# Patient Record
Sex: Female | Born: 1945 | Race: White | Hispanic: No | Marital: Married | Smoking: Former smoker
Health system: Southern US, Community
[De-identification: ages and names within clinical notes are randomized; demographics above are authoritative.]

## PROBLEM LIST (undated history)

## (undated) ENCOUNTER — Emergency Department (HOSPITAL_COMMUNITY): Admission: EM | Payer: Medicare Other | Source: Home / Self Care

## (undated) DIAGNOSIS — F419 Anxiety disorder, unspecified: Secondary | ICD-10-CM

## (undated) DIAGNOSIS — I10 Essential (primary) hypertension: Secondary | ICD-10-CM

## (undated) DIAGNOSIS — I4891 Unspecified atrial fibrillation: Secondary | ICD-10-CM

## (undated) DIAGNOSIS — I480 Paroxysmal atrial fibrillation: Principal | ICD-10-CM

## (undated) DIAGNOSIS — J449 Chronic obstructive pulmonary disease, unspecified: Secondary | ICD-10-CM

## (undated) DIAGNOSIS — E785 Hyperlipidemia, unspecified: Secondary | ICD-10-CM

## (undated) DIAGNOSIS — E559 Vitamin D deficiency, unspecified: Secondary | ICD-10-CM

## (undated) DIAGNOSIS — B029 Zoster without complications: Secondary | ICD-10-CM

## (undated) DIAGNOSIS — R0600 Dyspnea, unspecified: Secondary | ICD-10-CM

## (undated) DIAGNOSIS — Z950 Presence of cardiac pacemaker: Secondary | ICD-10-CM

## (undated) HISTORY — DX: Hyperlipidemia, unspecified: E78.5

## (undated) HISTORY — PX: ABDOMINAL HYSTERECTOMY: SHX81

## (undated) HISTORY — DX: Essential (primary) hypertension: I10

## (undated) HISTORY — DX: Paroxysmal atrial fibrillation: I48.0

## (undated) HISTORY — DX: Anxiety disorder, unspecified: F41.9

## (undated) HISTORY — DX: Vitamin D deficiency, unspecified: E55.9

## (undated) HISTORY — PX: CATARACT EXTRACTION: SUR2

---

## 2015-09-02 ENCOUNTER — Encounter (HOSPITAL_COMMUNITY): Payer: Self-pay | Admitting: *Deleted

## 2015-09-02 ENCOUNTER — Emergency Department (HOSPITAL_COMMUNITY)
Admission: EM | Admit: 2015-09-02 | Discharge: 2015-09-02 | Disposition: A | Discharge status: Home / Self Care | Payer: BLUE CROSS/BLUE SHIELD | Attending: Emergency Medicine | Admitting: Emergency Medicine

## 2015-09-02 ENCOUNTER — Emergency Department (HOSPITAL_COMMUNITY): Payer: BLUE CROSS/BLUE SHIELD

## 2015-09-02 DIAGNOSIS — Z8619 Personal history of other infectious and parasitic diseases: Secondary | ICD-10-CM | POA: Insufficient documentation

## 2015-09-02 DIAGNOSIS — I4891 Unspecified atrial fibrillation: Secondary | ICD-10-CM | POA: Insufficient documentation

## 2015-09-02 DIAGNOSIS — Z7901 Long term (current) use of anticoagulants: Secondary | ICD-10-CM | POA: Diagnosis not present

## 2015-09-02 DIAGNOSIS — Z7951 Long term (current) use of inhaled steroids: Secondary | ICD-10-CM | POA: Diagnosis not present

## 2015-09-02 DIAGNOSIS — J449 Chronic obstructive pulmonary disease, unspecified: Secondary | ICD-10-CM

## 2015-09-02 DIAGNOSIS — Z87891 Personal history of nicotine dependence: Secondary | ICD-10-CM | POA: Insufficient documentation

## 2015-09-02 DIAGNOSIS — Z79899 Other long term (current) drug therapy: Secondary | ICD-10-CM | POA: Insufficient documentation

## 2015-09-02 DIAGNOSIS — J441 Chronic obstructive pulmonary disease with (acute) exacerbation: Secondary | ICD-10-CM | POA: Diagnosis not present

## 2015-09-02 DIAGNOSIS — R0602 Shortness of breath: Secondary | ICD-10-CM | POA: Diagnosis present

## 2015-09-02 HISTORY — DX: Chronic obstructive pulmonary disease, unspecified: J44.9

## 2015-09-02 HISTORY — DX: Zoster without complications: B02.9

## 2015-09-02 LAB — CBC WITH DIFFERENTIAL/PLATELET
BASOS ABS: 0 10*3/uL (ref 0.0–0.1)
Basophils Relative: 0 %
EOS ABS: 0.1 10*3/uL (ref 0.0–0.7)
EOS PCT: 1 %
HCT: 37.7 % (ref 36.0–46.0)
Hemoglobin: 12 g/dL (ref 12.0–15.0)
LYMPHS PCT: 19 %
Lymphs Abs: 1.1 10*3/uL (ref 0.7–4.0)
MCH: 31.4 pg (ref 26.0–34.0)
MCHC: 31.8 g/dL (ref 30.0–36.0)
MCV: 98.7 fL (ref 78.0–100.0)
MONO ABS: 0.5 10*3/uL (ref 0.1–1.0)
Monocytes Relative: 9 %
Neutro Abs: 4.2 10*3/uL (ref 1.7–7.7)
Neutrophils Relative %: 71 %
PLATELETS: 196 10*3/uL (ref 150–400)
RBC: 3.82 MIL/uL — AB (ref 3.87–5.11)
RDW: 14.8 % (ref 11.5–15.5)
WBC: 5.9 10*3/uL (ref 4.0–10.5)

## 2015-09-02 LAB — I-STAT TROPONIN, ED: TROPONIN I, POC: 0 ng/mL (ref 0.00–0.08)

## 2015-09-02 LAB — BASIC METABOLIC PANEL
Anion gap: 8 (ref 5–15)
BUN: 13 mg/dL (ref 6–20)
CALCIUM: 9.3 mg/dL (ref 8.9–10.3)
CO2: 36 mmol/L — ABNORMAL HIGH (ref 22–32)
CREATININE: 1.23 mg/dL — AB (ref 0.44–1.00)
Chloride: 98 mmol/L — ABNORMAL LOW (ref 101–111)
GFR calc Af Amer: 51 mL/min — ABNORMAL LOW (ref >60)
GFR, EST NON AFRICAN AMERICAN: 44 mL/min — AB (ref >60)
GLUCOSE: 96 mg/dL (ref 65–99)
POTASSIUM: 3.6 mmol/L (ref 3.5–5.1)
SODIUM: 142 mmol/L (ref 135–145)

## 2015-09-02 LAB — BRAIN NATRIURETIC PEPTIDE: B NATRIURETIC PEPTIDE 5: 403 pg/mL — AB (ref 0.0–100.0)

## 2015-09-02 MED ORDER — METHYLPREDNISOLONE SODIUM SUCC 125 MG IJ SOLR
125.0000 mg | Freq: Once | INTRAMUSCULAR | Status: AC
  Administered 2015-09-02: 125 mg via INTRAVENOUS
  Filled 2015-09-02: qty 2

## 2015-09-02 MED ORDER — ALBUTEROL SULFATE (2.5 MG/3ML) 0.083% IN NEBU
10.0000 mg | INHALATION_SOLUTION | Freq: Once | RESPIRATORY_TRACT | Status: AC
  Administered 2015-09-02: 10 mg via RESPIRATORY_TRACT
  Filled 2015-09-02: qty 12

## 2015-09-02 MED ORDER — IPRATROPIUM BROMIDE 0.02 % IN SOLN
0.5000 mg | Freq: Once | RESPIRATORY_TRACT | Status: AC
  Administered 2015-09-02: 0.5 mg via RESPIRATORY_TRACT
  Filled 2015-09-02: qty 2.5

## 2015-09-02 MED ORDER — PREDNISONE 10 MG (21) PO TBPK: 10.0000 mg | ORAL_TABLET | Freq: Every day | ORAL | Status: DC

## 2015-09-02 NOTE — Progress Notes (Signed)
Post CAT increased BBS with some faint expiratory wheezing noted. RA SpO2 90%.

## 2015-09-02 NOTE — ED Provider Notes (Signed)
CSN: [id number]     Arrival date & time 09/02/15  1107 History  By signing my name below, I, Jarvis Morgan, attest that this documentation has been prepared under the direction and in the presence of [name], MD. Electronically Signed: Jarvis Morgan, ED Scribe. 09/02/2015. 3:19 PM.    Chief Complaint  Patient presents with  . Shortness of Breath    The history is provided by the patient. No language interpreter was used.    HPI Comments: Tracey Dudley is a 69 y.o. female with a h/o COPD and atrial fibrilation who presents to the Emergency Department complaining of intermittent, moderate, SOB onset 2 weeks that has gradually worsened over the past 3 days. She endorses associated nausea.  Pt's son reports she followed up with an asthma specialist 2 weeks ago for similar episodes and was told to go to [facility] for treatment; she notes while there she was given breathing treatments and prednisone. Pt is no longer on prednisone medication.She notes she also recently followed up with her pulmonologist for this, Dr. Susa Simmonds, who advised her to use her inhaler 2x daily and nebulizer machine as needed. Pt is oxygen dependent at home and she reports that when she does not wear her O2 that her O2 saturation drops to 91%. Pt states the SOB is exacerbated with exertion. She notes her O2 saturation levels are stable at rest. Pt was recently diagnosed with shingles to her right arm several weeks ago. Pt is a former smoker. She denies any other associated symptoms.   Past Medical History  Diagnosis Date  . A-fib (HCC)   . COPD (chronic obstructive pulmonary disease) (HCC)   . Herpes zoster    History reviewed. No pertinent past surgical history. No family history on file. Social History  Substance Use Topics  . Smoking status: Former Games developer  . Smokeless tobacco: None  . Alcohol Use: No   OB History    No data available     Review of Systems  All other systems reviewed and are  negative.   Allergies  Metoprolol and Erythromycin  Home Medications   Prior to Admission medications   Medication Sig Start Date End Date Taking? Authorizing Provider  acetaminophen (TYLENOL) 500 MG tablet Take 1,000 mg by mouth every 6 (six) hours as needed for moderate pain.   Yes Historical Provider, MD  Aclidinium Bromide (TUDORZA PRESSAIR) 400 MCG/ACT AEPB Inhale 1 puff into the lungs daily.   Yes Historical Provider, MD  albuterol (PROVENTIL HFA;VENTOLIN HFA) 108 (90 BASE) MCG/ACT inhaler Inhale 1 puff into the lungs every 6 (six) hours as needed for wheezing or shortness of breath.   Yes Historical Provider, MD  atorvastatin (LIPITOR) 20 MG tablet Take 20 mg by mouth daily.   Yes Historical Provider, MD  Azilsartan Medoxomil (EDARBI) 80 MG TABS Take 1 tablet by mouth daily.   Yes Historical Provider, MD  dofetilide (TIKOSYN) 250 MCG capsule Take 250 mcg by mouth 2 (two) times daily.   Yes Historical Provider, MD  fluticasone-salmeterol (ADVAIR HFA) 115-21 MCG/ACT inhaler Inhale 2 puffs into the lungs 2 (two) times daily.   Yes Historical Provider, MD  furosemide (LASIX) 20 MG tablet Take 20 mg by mouth daily.   Yes Historical Provider, MD  hydrALAZINE (APRESOLINE) 100 MG tablet Take 100 mg by mouth 2 (two) times daily.   Yes Historical Provider, MD  Nebivolol HCl (BYSTOLIC) 20 MG TABS Take 1 tablet by mouth daily.   Yes Historical Provider, MD  predniSONE (STERAPRED UNI-PAK 21 TAB) 10 MG (21) TBPK tablet Take 1 tablet (10 mg total) by mouth daily. Take 6 tabs by mouth daily  for 2 days, then 5 tabs for 2 days, then 4 tabs for 2 days, then 3 tabs for 2 days, 2 tabs for 2 days, then 1 tab by mouth daily for 2 days 09/02/15   [name], MD  rivaroxaban (XARELTO) 20 MG TABS tablet Take 20 mg by mouth daily with supper.   Yes Historical Provider, MD   Triage Vitals: BP 158/67 mmHg  Pulse 57  Temp(Src) 97.5 F (36.4 C) (Oral)  Resp 22  Ht  (1.676 m)  Wt 168 lb (76.204 kg)   BMI 27.13 kg/m2  SpO2 93%  Physical Exam Physical Exam  Nursing note and vitals reviewed. Constitutional: She is oriented to person, place, and time. She appears well-developed and well-nourished. No distress.  HENT:  Head: Normocephalic and atraumatic.  Eyes: Pupils are equal, round, and reactive to light.  Neck: Normal range of motion.  Cardiovascular: Normal rate and intact distal pulses.   Pulmonary/Chest: No respiratory distress.  patient had mild expiratory wheezes to auscultation in all lung fields. Abdominal: Normal appearance. She exhibits no distension.  Musculoskeletal: Normal range of motion.  Neurological: She is alert and oriented to person, place, and time. No cranial nerve deficit.  Skin: Skin is warm and dry. No rash noted.  Psychiatric: She has a normal mood and affect. Her behavior is normal.   ED Course  Procedures (including critical care time)  DIAGNOSTIC STUDIES: Oxygen Saturation is 93% on RA, adequate by my interpretation.    COORDINATION OF CARE: 11:27 AM- Will order 12 Lead EKG.  Pt advised of plan for treatment and pt agrees.  Labs Review Labs Reviewed  BASIC METABOLIC PANEL - Abnormal; Notable for the following:    Chloride 98 (*)    CO2 36 (*)    Creatinine, Ser 1.23 (*)    GFR calc non Af Amer 44 (*)    GFR calc Af Amer 51 (*)    All other components within normal limits  CBC WITH DIFFERENTIAL/PLATELET - Abnormal; Notable for the following:    RBC 3.82 (*)    All other components within normal limits  BRAIN NATRIURETIC PEPTIDE - Abnormal; Notable for the following:    B Natriuretic Peptide 403.0 (*)    All other components within normal limits  I-STAT TROPOININ, ED    Imaging Review Dg Chest 2 View  09/02/2015  CLINICAL DATA:  Shortness of breath for 2 weeks, history of COPD EXAM: CHEST - 2 VIEW COMPARISON:  None. FINDINGS: Cardiac shadow is within normal limits. The lungs are well aerated bilaterally. Hyperinflation is noted. No acute  bony abnormality is noted. IMPRESSION: COPD without acute abnormality. Electronically Signed   By: Alcide Clever M.D.   On: 09/02/2015 12:31   I have personally reviewed and evaluated these images and lab results as part of my medical decision-making.   EKG Interpretation   Date/Time:  Friday September 02 2015 11:22:11 EST Ventricular Rate:  58 PR Interval:  155 QRS Duration: 98 QT Interval:  491 QTC Calculation: 482 R Axis:   92 Text Interpretation:  Sinus rhythm Right axis deviation Abnrm T, consider  ischemia, anterolateral lds No previous tracing Confirmed by Chelcie Estorga  MD,  Jonte Shiller ([id number]) on 09/02/2015 11:43:44 AM     Patient felt improved and wants to go home.  She has oxygen at home in case  she needs it. MDM   Final diagnoses:  Chronic obstructive pulmonary disease, unspecified COPD type (HCC)    I personally performed the services described in this documentation, which was scribed in my presence. The recorded information has been reviewed and considered.     Nelva Nayobert Deborah Dondero, MD 09/02/15 1520

## 2015-09-02 NOTE — ED Notes (Signed)
MD at bedside. 

## 2015-09-02 NOTE — Discharge Instructions (Signed)
Asthma, Adult Asthma is a recurring condition in which the airways tighten and narrow. Asthma can make it difficult to breathe. It can cause coughing, wheezing, and shortness of breath. Asthma episodes, also called asthma attacks, range from minor to life-threatening. Asthma cannot be cured, but medicines and lifestyle changes can help control it. CAUSES Asthma is believed to be caused by inherited (genetic) and environmental factors, but its exact cause is unknown. Asthma may be triggered by allergens, lung infections, or irritants in the air. Asthma triggers are different for each person. Common triggers include:   Animal dander.  Dust mites.  Cockroaches.  Pollen from trees or grass.  Mold.  Smoke.  Air pollutants such as dust, household cleaners, hair sprays, aerosol sprays, paint fumes, strong chemicals, or strong odors.  Cold air, weather changes, and winds (which increase molds and pollens in the air).  Strong emotional expressions such as crying or laughing hard.  Stress.  Certain medicines (such as aspirin) or types of drugs (such as beta-blockers).  Sulfites in foods and drinks. Foods and drinks that may contain sulfites include dried fruit, potato chips, and sparkling grape juice.  Infections or inflammatory conditions such as the flu, a cold, or an inflammation of the nasal membranes (rhinitis).  Gastroesophageal reflux disease (GERD).  Exercise or strenuous activity. SYMPTOMS Symptoms may occur immediately after asthma is triggered or many hours later. Symptoms include:  Wheezing.  Excessive nighttime or early morning coughing.  Frequent or severe coughing with a common cold.  Chest tightness.  Shortness of breath. DIAGNOSIS  The diagnosis of asthma is made by a review of your medical history and a physical exam. Tests may also be performed. These may include:  Lung function studies. These tests show how much air you breathe in and out.  Allergy  tests.  Imaging tests such as X-rays. TREATMENT  Asthma cannot be cured, but it can usually be controlled. Treatment involves identifying and avoiding your asthma triggers. It also involves medicines. There are 2 classes of medicine used for asthma treatment:   Controller medicines. These prevent asthma symptoms from occurring. They are usually taken every day.  Reliever or rescue medicines. These quickly relieve asthma symptoms. They are used as needed and provide short-term relief. Your health care provider will help you create an asthma action plan. An asthma action plan is a written plan for managing and treating your asthma attacks. It includes a list of your asthma triggers and how they may be avoided. It also includes information on when medicines should be taken and when their dosage should be changed. An action plan may also involve the use of a device called a peak flow meter. A peak flow meter measures how well the lungs are working. It helps you monitor your condition. HOME CARE INSTRUCTIONS   Take medicines only as directed by your health care provider. Speak with your health care provider if you have questions about how or when to take the medicines.  Use a peak flow meter as directed by your health care provider. Record and keep track of readings.  Understand and use the action plan to help minimize or stop an asthma attack without needing to seek medical care.  Control your home environment in the following ways to help prevent asthma attacks:  Do not smoke. Avoid being exposed to secondhand smoke.  Change your heating and air conditioning filter regularly.  Limit your use of fireplaces and wood stoves.  Get rid of pests (such as roaches   and mice) and their droppings.  Throw away plants if you see mold on them.  Clean your floors and dust regularly. Use unscented cleaning products.  Try to have someone else vacuum for you regularly. Stay out of rooms while they are  being vacuumed and for a short while afterward. If you vacuum, use a dust mask from a hardware store, a double-layered or microfilter vacuum cleaner bag, or a vacuum cleaner with a HEPA filter.  Replace carpet with wood, tile, or vinyl flooring. Carpet can trap dander and dust.  Use allergy-proof pillows, mattress covers, and box spring covers.  Wash bed sheets and blankets every week in hot water and dry them in a dryer.  Use blankets that are made of polyester or cotton.  Clean bathrooms and kitchens with bleach. If possible, have someone repaint the walls in these rooms with mold-resistant paint. Keep out of the rooms that are being cleaned and painted.  Wash hands frequently. SEEK MEDICAL CARE IF:   You have wheezing, shortness of breath, or a cough even if taking medicine to prevent attacks.  The colored mucus you cough up (sputum) is thicker than usual.  Your sputum changes from clear or white to yellow, green, gray, or bloody.  You have any problems that may be related to the medicines you are taking (such as a rash, itching, swelling, or trouble breathing).  You are using a reliever medicine more than 2-3 times per week.  Your peak flow is still at 50-79% of your personal best after following your action plan for 1 hour.  You have a fever. SEEK IMMEDIATE MEDICAL CARE IF:   You seem to be getting worse and are unresponsive to treatment during an asthma attack.  You are short of breath even at rest.  You get short of breath when doing very little physical activity.  You have difficulty eating, drinking, or talking due to asthma symptoms.  You develop chest pain.  You develop a fast heartbeat.  You have a bluish color to your lips or fingernails.  You are light-headed, dizzy, or faint.  Your peak flow is less than 50% of your personal best.   This information is not intended to replace advice given to you by your health care provider. Make sure you discuss any  questions you have with your health care provider.   Document Released: 09/03/2005 Document Revised: 05/25/2015 Document Reviewed: 04/02/2013 Elsevier Interactive Patient Education 2016 [employer]  

## 2015-09-02 NOTE — ED Notes (Signed)
Pt comes in with shortness of breath starting 3 days, pt has hx of COPD. Pt recently has same type of episode and was given breathing treatments and prednisone.   Pt wears home oxygen, when pt does not have oxygen on her saturation drops to 91%.

## 2015-10-08 ENCOUNTER — Emergency Department (HOSPITAL_COMMUNITY): Payer: PRIVATE HEALTH INSURANCE

## 2015-10-08 ENCOUNTER — Inpatient Hospital Stay (HOSPITAL_COMMUNITY)
Admission: EM | Admit: 2015-10-08 | Discharge: 2015-10-10 | DRG: 190 | Disposition: A | Discharge status: Home / Self Care | Payer: PRIVATE HEALTH INSURANCE | Attending: Internal Medicine | Admitting: Internal Medicine

## 2015-10-08 ENCOUNTER — Encounter (HOSPITAL_COMMUNITY): Payer: Self-pay | Admitting: Emergency Medicine

## 2015-10-08 DIAGNOSIS — J440 Chronic obstructive pulmonary disease with acute lower respiratory infection: Secondary | ICD-10-CM | POA: Diagnosis present

## 2015-10-08 DIAGNOSIS — I4891 Unspecified atrial fibrillation: Secondary | ICD-10-CM | POA: Diagnosis not present

## 2015-10-08 DIAGNOSIS — I110 Hypertensive heart disease with heart failure: Secondary | ICD-10-CM | POA: Diagnosis present

## 2015-10-08 DIAGNOSIS — J449 Chronic obstructive pulmonary disease, unspecified: Secondary | ICD-10-CM | POA: Diagnosis present

## 2015-10-08 DIAGNOSIS — R0602 Shortness of breath: Secondary | ICD-10-CM | POA: Diagnosis present

## 2015-10-08 DIAGNOSIS — J441 Chronic obstructive pulmonary disease with (acute) exacerbation: Secondary | ICD-10-CM | POA: Diagnosis present

## 2015-10-08 DIAGNOSIS — J189 Pneumonia, unspecified organism: Secondary | ICD-10-CM | POA: Diagnosis present

## 2015-10-08 DIAGNOSIS — Z87891 Personal history of nicotine dependence: Secondary | ICD-10-CM | POA: Diagnosis not present

## 2015-10-08 DIAGNOSIS — Z7901 Long term (current) use of anticoagulants: Secondary | ICD-10-CM

## 2015-10-08 DIAGNOSIS — I10 Essential (primary) hypertension: Secondary | ICD-10-CM | POA: Diagnosis present

## 2015-10-08 DIAGNOSIS — Z7952 Long term (current) use of systemic steroids: Secondary | ICD-10-CM

## 2015-10-08 DIAGNOSIS — Z9981 Dependence on supplemental oxygen: Secondary | ICD-10-CM

## 2015-10-08 DIAGNOSIS — I480 Paroxysmal atrial fibrillation: Secondary | ICD-10-CM | POA: Diagnosis not present

## 2015-10-08 DIAGNOSIS — I509 Heart failure, unspecified: Secondary | ICD-10-CM | POA: Diagnosis not present

## 2015-10-08 DIAGNOSIS — I5031 Acute diastolic (congestive) heart failure: Secondary | ICD-10-CM | POA: Diagnosis present

## 2015-10-08 LAB — COMPREHENSIVE METABOLIC PANEL
ALK PHOS: 72 U/L (ref 38–126)
ALT: 20 U/L (ref 14–54)
AST: 18 U/L (ref 15–41)
Albumin: 3.2 g/dL — ABNORMAL LOW (ref 3.5–5.0)
Anion gap: 8 (ref 5–15)
BILIRUBIN TOTAL: 1.5 mg/dL — AB (ref 0.3–1.2)
BUN: 14 mg/dL (ref 6–20)
CALCIUM: 9.1 mg/dL (ref 8.9–10.3)
CO2: 36 mmol/L — AB (ref 22–32)
CREATININE: 0.93 mg/dL (ref 0.44–1.00)
Chloride: 100 mmol/L — ABNORMAL LOW (ref 101–111)
GFR calc non Af Amer: >60 mL/min (ref >60)
GLUCOSE: 113 mg/dL — AB (ref 65–99)
Potassium: 4 mmol/L (ref 3.5–5.1)
SODIUM: 144 mmol/L (ref 135–145)
TOTAL PROTEIN: 6.7 g/dL (ref 6.5–8.1)

## 2015-10-08 LAB — CBC WITH DIFFERENTIAL/PLATELET
Basophils Absolute: 0 10*3/uL (ref 0.0–0.1)
Basophils Relative: 0 %
EOS ABS: 0.1 10*3/uL (ref 0.0–0.7)
Eosinophils Relative: 1 %
HEMATOCRIT: 36.6 % (ref 36.0–46.0)
HEMOGLOBIN: 11.4 g/dL — AB (ref 12.0–15.0)
LYMPHS ABS: 0.5 10*3/uL — AB (ref 0.7–4.0)
LYMPHS PCT: 5 %
MCH: 31.5 pg (ref 26.0–34.0)
MCHC: 31.1 g/dL (ref 30.0–36.0)
MCV: 101.1 fL — ABNORMAL HIGH (ref 78.0–100.0)
MONOS PCT: 9 %
Monocytes Absolute: 1 10*3/uL (ref 0.1–1.0)
NEUTROS ABS: 8.9 10*3/uL — AB (ref 1.7–7.7)
NEUTROS PCT: 85 %
Platelets: 208 10*3/uL (ref 150–400)
RBC: 3.62 MIL/uL — AB (ref 3.87–5.11)
RDW: 14 % (ref 11.5–15.5)
WBC: 10.4 10*3/uL (ref 4.0–10.5)

## 2015-10-08 LAB — BRAIN NATRIURETIC PEPTIDE: B Natriuretic Peptide: 735 pg/mL — ABNORMAL HIGH (ref 0.0–100.0)

## 2015-10-08 MED ORDER — ATORVASTATIN CALCIUM 20 MG PO TABS
20.0000 mg | ORAL_TABLET | Freq: Every day | ORAL | Status: DC
  Administered 2015-10-08 – 2015-10-09 (×2): 20 mg via ORAL
  Filled 2015-10-08 (×2): qty 1

## 2015-10-08 MED ORDER — CEFTRIAXONE SODIUM 1 G IJ SOLR
1.0000 g | INTRAMUSCULAR | Status: DC
  Administered 2015-10-09: 1 g via INTRAVENOUS
  Filled 2015-10-08 (×3): qty 10

## 2015-10-08 MED ORDER — DEXTROSE 5 % IV SOLN
500.0000 mg | INTRAVENOUS | Status: DC
  Administered 2015-10-08 – 2015-10-09 (×2): 500 mg via INTRAVENOUS
  Filled 2015-10-08 (×4): qty 500

## 2015-10-08 MED ORDER — METHYLPREDNISOLONE SODIUM SUCC 40 MG IJ SOLR
40.0000 mg | Freq: Four times a day (QID) | INTRAMUSCULAR | Status: DC
Start: 2015-10-08 — End: 2015-10-10
  Administered 2015-10-08 – 2015-10-10 (×7): 40 mg via INTRAVENOUS
  Filled 2015-10-08 (×7): qty 1

## 2015-10-08 MED ORDER — HYDRALAZINE HCL 25 MG PO TABS
100.0000 mg | ORAL_TABLET | Freq: Two times a day (BID) | ORAL | Status: DC
  Administered 2015-10-08 – 2015-10-10 (×4): 100 mg via ORAL
  Filled 2015-10-08 (×4): qty 4

## 2015-10-08 MED ORDER — DOFETILIDE 125 MCG PO CAPS
250.0000 ug | ORAL_CAPSULE | Freq: Two times a day (BID) | ORAL | Status: DC
  Administered 2015-10-08 – 2015-10-10 (×4): 250 ug via ORAL
  Filled 2015-10-08 (×3): qty 2
  Filled 2015-10-08: qty 1
  Filled 2015-10-08: qty 2
  Filled 2015-10-08 (×3): qty 1

## 2015-10-08 MED ORDER — RIVAROXABAN 20 MG PO TABS
20.0000 mg | ORAL_TABLET | Freq: Every day | ORAL | Status: DC
  Administered 2015-10-08 – 2015-10-09 (×2): 20 mg via ORAL
  Filled 2015-10-08 (×2): qty 1

## 2015-10-08 MED ORDER — ACETAMINOPHEN 500 MG PO TABS: 1000.0000 mg | ORAL_TABLET | Freq: Four times a day (QID) | ORAL | Status: DC | PRN

## 2015-10-08 MED ORDER — ALPRAZOLAM 0.5 MG PO TABS: 0.5000 mg | ORAL_TABLET | Freq: Three times a day (TID) | ORAL | Status: DC | PRN

## 2015-10-08 MED ORDER — DEXTROSE 5 % IV SOLN
1.0000 g | Freq: Once | INTRAVENOUS | Status: AC
  Administered 2015-10-08: 1 g via INTRAVENOUS
  Filled 2015-10-08: qty 10

## 2015-10-08 MED ORDER — FUROSEMIDE 10 MG/ML IJ SOLN
40.0000 mg | Freq: Once | INTRAMUSCULAR | Status: AC
  Administered 2015-10-08: 40 mg via INTRAVENOUS
  Filled 2015-10-08: qty 4

## 2015-10-08 MED ORDER — IPRATROPIUM-ALBUTEROL 0.5-2.5 (3) MG/3ML IN SOLN
3.0000 mL | Freq: Once | RESPIRATORY_TRACT | Status: AC
  Administered 2015-10-08: 3 mL via RESPIRATORY_TRACT
  Filled 2015-10-08: qty 3

## 2015-10-08 MED ORDER — ALBUTEROL SULFATE (2.5 MG/3ML) 0.083% IN NEBU: 2.5000 mg | INHALATION_SOLUTION | Freq: Four times a day (QID) | RESPIRATORY_TRACT | Status: DC

## 2015-10-08 MED ORDER — IRBESARTAN 300 MG PO TABS
300.0000 mg | ORAL_TABLET | Freq: Every day | ORAL | Status: DC
  Administered 2015-10-09 – 2015-10-10 (×2): 300 mg via ORAL
  Filled 2015-10-08 (×2): qty 1

## 2015-10-08 MED ORDER — SODIUM CHLORIDE 0.9 % IJ SOLN
3.0000 mL | INTRAMUSCULAR | Status: DC | PRN
  Administered 2015-10-09: 3 mL via INTRAVENOUS
  Filled 2015-10-08: qty 3

## 2015-10-08 MED ORDER — ONDANSETRON HCL 4 MG PO TABS: 4.0000 mg | ORAL_TABLET | Freq: Four times a day (QID) | ORAL | Status: DC | PRN

## 2015-10-08 MED ORDER — ACETAMINOPHEN 650 MG RE SUPP: 650.0000 mg | Freq: Four times a day (QID) | RECTAL | Status: DC | PRN

## 2015-10-08 MED ORDER — ONDANSETRON HCL 4 MG/2ML IJ SOLN: 4.0000 mg | Freq: Four times a day (QID) | INTRAMUSCULAR | Status: DC | PRN

## 2015-10-08 MED ORDER — METHYLPREDNISOLONE SODIUM SUCC 125 MG IJ SOLR
125.0000 mg | Freq: Once | INTRAMUSCULAR | Status: AC
  Administered 2015-10-08: 125 mg via INTRAVENOUS
  Filled 2015-10-08: qty 2

## 2015-10-08 MED ORDER — SODIUM CHLORIDE 0.9 % IJ SOLN
3.0000 mL | Freq: Two times a day (BID) | INTRAMUSCULAR | Status: DC
  Administered 2015-10-09 – 2015-10-10 (×2): 3 mL via INTRAVENOUS

## 2015-10-08 MED ORDER — ALBUTEROL SULFATE (2.5 MG/3ML) 0.083% IN NEBU
2.5000 mg | INHALATION_SOLUTION | Freq: Four times a day (QID) | RESPIRATORY_TRACT | Status: DC
  Administered 2015-10-08 – 2015-10-10 (×7): 2.5 mg via RESPIRATORY_TRACT
  Filled 2015-10-08 (×7): qty 3

## 2015-10-08 MED ORDER — ALBUTEROL SULFATE (2.5 MG/3ML) 0.083% IN NEBU
2.5000 mg | INHALATION_SOLUTION | Freq: Once | RESPIRATORY_TRACT | Status: AC
  Administered 2015-10-08: 2.5 mg via RESPIRATORY_TRACT
  Filled 2015-10-08: qty 3

## 2015-10-08 MED ORDER — NEBIVOLOL HCL 10 MG PO TABS
20.0000 mg | ORAL_TABLET | Freq: Every day | ORAL | Status: DC
  Administered 2015-10-09 – 2015-10-10 (×2): 20 mg via ORAL
  Filled 2015-10-08 (×2): qty 2

## 2015-10-08 MED ORDER — SODIUM CHLORIDE 0.9 % IV SOLN: 250.0000 mL | INTRAVENOUS | Status: DC | PRN

## 2015-10-08 MED ORDER — AZITHROMYCIN 500 MG IV SOLR: 500.0000 mg | INTRAVENOUS | Status: DC

## 2015-10-08 MED ORDER — FUROSEMIDE 10 MG/ML IJ SOLN
20.0000 mg | Freq: Two times a day (BID) | INTRAMUSCULAR | Status: DC
  Administered 2015-10-08 – 2015-10-10 (×4): 20 mg via INTRAVENOUS
  Filled 2015-10-08 (×4): qty 2

## 2015-10-08 MED ORDER — SODIUM CHLORIDE 0.9 % IJ SOLN
3.0000 mL | Freq: Two times a day (BID) | INTRAMUSCULAR | Status: DC
  Administered 2015-10-08 – 2015-10-09 (×3): 3 mL via INTRAVENOUS

## 2015-10-08 MED ORDER — ACETAMINOPHEN 325 MG PO TABS: 650.0000 mg | ORAL_TABLET | Freq: Four times a day (QID) | ORAL | Status: DC | PRN

## 2015-10-08 NOTE — H&P (Signed)
[facility] History and Physical  Tracey Dudley ZOX:[id number] DOB: [birth year]    PCP:   No primary care provider on file.   Chief Complaint: SOB and coughs for 4 days.   HPI: Tracey Dudley is an 70 y.o. female with hx of afib, s/p prior cardioversion, on Xarelto and Tykosin, hx of COPD on home oxygen, prior tobacco abuse, hx of HTN, presented to the ER with 4 days hx of coughs, SOB, wheezing, but no fever, chills, or CP.  She denied DM or hx of CHF.  Evaluation in the ER showed CXR with possible PNA, BNP of 700's and normal WBC and renal fx tests.  She was started on IV rocephin and IV Zithromax, given IV steriods, and neb and hospitalist was asked to admit her for COPD exacerbation with CAP.   Rewiew of Systems:  Constitutional: Negative for malaise, fever and chills. No significant weight loss or weight gain Eyes: Negative for eye pain, redness and discharge, diplopia, visual changes, or flashes of light. ENMT: Negative for ear pain, hoarseness, nasal congestion, sinus pressure and sore throat. No headaches; tinnitus, drooling, or problem swallowing. Cardiovascular: Negative for chest pain, palpitations, diaphoresis, dyspnea and peripheral edema. ; No orthopnea, PND Respiratory: Negative for  hemoptysis,  and stridor. No pleuritic chestpain. Gastrointestinal: Negative for nausea, vomiting, diarrhea, constipation, abdominal pain, melena, blood in stool, hematemesis, jaundice and rectal bleeding.    Genitourinary: Negative for frequency, dysuria, incontinence,flank pain and hematuria; Musculoskeletal: Negative for back pain and neck pain. Negative for swelling and trauma.;  Skin: . Negative for pruritus, rash, abrasions, bruising and skin lesion.; ulcerations Neuro: Negative for headache, lightheadedness and neck stiffness. Negative for weakness, altered level of consciousness , altered mental status, extremity weakness, burning feet, involuntary movement, seizure and syncope.  Psych:  negative for anxiety, depression, insomnia, tearfulness, panic attacks, hallucinations, paranoia, suicidal or homicidal ideation    Past Medical History  Diagnosis Date  . A-fib (HCC)   . COPD (chronic obstructive pulmonary disease) (HCC)   . Herpes zoster   . Hypertension     Past Surgical History  Procedure Laterality Date  . Abdominal hysterectomy      Medications:  HOME MEDS: Prior to Admission medications   Medication Sig Start Date End Date Taking? Authorizing Provider  acetaminophen (TYLENOL) 500 MG tablet Take 1,000 mg by mouth every 6 (six) hours as needed for moderate pain.   Yes Historical Provider, MD  Aclidinium Bromide (TUDORZA PRESSAIR) 400 MCG/ACT AEPB Inhale 1 puff into the lungs daily.   Yes Historical Provider, MD  albuterol (PROVENTIL HFA;VENTOLIN HFA) 108 (90 BASE) MCG/ACT inhaler Inhale 1 puff into the lungs every 6 (six) hours as needed for wheezing or shortness of breath.   Yes Historical Provider, MD  ALPRAZolam Prudy Feeler) 0.5 MG tablet Take 0.5 mg by mouth 3 (three) times daily as needed for anxiety.   Yes Historical Provider, MD  atorvastatin (LIPITOR) 20 MG tablet Take 20 mg by mouth daily.   Yes Historical Provider, MD  Azilsartan Medoxomil (EDARBI) 80 MG TABS Take 1 tablet by mouth daily.   Yes Historical Provider, MD  dofetilide (TIKOSYN) 250 MCG capsule Take 250 mcg by mouth 2 (two) times daily.   Yes Historical Provider, MD  fluticasone-salmeterol (ADVAIR HFA) 115-21 MCG/ACT inhaler Inhale 2 puffs into the lungs 2 (two) times daily.   Yes Historical Provider, MD  furosemide (LASIX) 20 MG tablet Take 20 mg by mouth daily.   Yes Historical Provider, MD  hydrALAZINE (APRESOLINE) 100  MG tablet Take 100 mg by mouth 2 (two) times daily.   Yes Historical Provider, MD  Nebivolol HCl (BYSTOLIC) 20 MG TABS Take 1 tablet by mouth daily.   Yes Historical Provider, MD  predniSONE (DELTASONE) 5 MG tablet Take 5 mg by mouth daily with breakfast.   Yes Historical  Provider, MD  rivaroxaban (XARELTO) 20 MG TABS tablet Take 20 mg by mouth daily with supper.   Yes Historical Provider, MD  predniSONE (STERAPRED UNI-PAK 21 TAB) 10 MG (21) TBPK tablet Take 1 tablet (10 mg total) by mouth daily. Take 6 tabs by mouth daily  for 2 days, then 5 tabs for 2 days, then 4 tabs for 2 days, then 3 tabs for 2 days, 2 tabs for 2 days, then 1 tab by mouth daily for 2 days Patient not taking: Reported on 10/08/2015 09/02/15   [name], MD     Allergies:  Allergies  Allergen Reactions  . Metoprolol Shortness Of Breath and Other (See Comments)    Tiredness  . Erythromycin Nausea Only    Social History:   reports that she has quit smoking. She does not have any smokeless tobacco history on file. She reports that she does not drink alcohol or use illicit drugs.  Family History: History reviewed. No pertinent family history.   Physical Exam: Filed Vitals:   10/08/15 1430 10/08/15 1452 10/08/15 1500 10/08/15 1530  BP: 165/76 165/76 160/83 148/77  Pulse: 67 83 81 65  Temp:      TempSrc:      Resp: Height:      Weight:      SpO2: 95% 90% 96% 97%   Blood pressure 148/77, pulse 65, temperature 98.7 F (37.1 C), temperature source Oral, resp. rate 26, height  (1.676 m), weight 78.019 kg (172 lb), SpO2 97 %.  GEN:  Pleasant  patient lying in the stretcher in no acute distress; cooperative with exam. PSYCH:  alert and oriented x4; does not appear anxious or depressed; affect is appropriate. HEENT: Mucous membranes pink and anicteric; PERRLA; EOM intact; no cervical lymphadenopathy nor thyromegaly or carotid bruit; no JVD; There were no stridor. Neck is very supple. Breasts:: Not examined CHEST WALL: No tenderness CHEST: Normal respiration, bilateral wheezing, with rales bilateral bases, and diffuse rhonchi.  HEART: Regular rate and rhythm.  There are no murmur, rub, or gallops.   BACK: No kyphosis or scoliosis; no CVA tenderness ABDOMEN: soft  and non-tender; no masses, no organomegaly, normal abdominal bowel sounds; no pannus; no intertriginous candida. There is no rebound and no distention. Rectal Exam: Not done EXTREMITIES: No bone or joint deformity; age-appropriate arthropathy of the hands and knees; no edema; no ulcerations.  There is no calf tenderness. Genitalia: not examined PULSES: 2+ and symmetric SKIN: Normal hydration no rash or ulceration CNS: Cranial nerves 2-12 grossly intact no focal lateralizing neurologic deficit.  Speech is fluent; uvula elevated with phonation, facial symmetry and tongue midline. DTR are normal bilaterally, cerebella exam is intact, barbinski is negative and strengths are equaled bilaterally.  No sensory loss.   Labs on Admission:  Basic Metabolic Panel:  Recent Labs Lab 10/08/15 1222  NA 144  K 4.0  CL 100*  CO2 36*  GLUCOSE 113*  BUN 14  CREATININE 0.93  CALCIUM 9.1   Liver Function Tests:  Recent Labs Lab 10/08/15 1222  AST 18  ALT 20  ALKPHOS 72  BILITOT 1.5*  PROT 6.7  ALBUMIN 3.2*  CBC:  Recent Labs Lab 10/08/15 1222  WBC 10.4  NEUTROABS 8.9*  HGB 11.4*  HCT 36.6  MCV 101.1*  PLT 208   Radiological Exams on Admission: Dg Chest 2 View  10/08/2015  CLINICAL DATA:  70 year old female with shortness of breath. Clinical history includes COPD and asthma EXAM: CHEST  2 VIEW COMPARISON:  Prior chest x-ray 09/02/2015 FINDINGS: Stable cardiac and mediastinal contours. Atherosclerotic calcifications again noted in the transverse aorta. Subtle patchy airspace opacity in the right lower lobe best seen on the frontal view is an interval finding. The lungs are slightly hyperinflated. Stable chronic bronchitic change and diffuse mild interstitial prominence. No pleural effusion or pneumothorax. No acute osseous abnormality. IMPRESSION: Patchy airspace opacity in the right lower lobe concerning for bronchopneumonia. Followup PA and lateral chest X-ray is recommended in 3-4 weeks  following trial of antibiotic therapy to ensure resolution and exclude underlying malignancy. Stable background pulmonary parenchymal changes consistent with the clinical history of COPD. Electronically Signed   By: Malachy Moan M.D.   On: 10/08/2015 13:38    Assessment/Plan Present on Admission:  . CAP (community acquired pneumonia) . A-fib (HCC) . HTN (hypertension) . COPD with exacerbation (HCC)  PLAN:    COPD Exacerbation with CAP:  WIll continue with oxygen, nebs, IV steroids, and IV antibiotics.  HTN:  Will continue her BP meds.   Mild CHF, likely diastolic.  Will give her IV lasix.  Low dose. She has her cardiologist in [place].  Willl not obtain ECHO.  Afib:  Continue Tykosin.  Continue with Xarelto.   Other plans as per orders. Code Status: FULL CODE>   Houston Siren, MD. FACP [facility] Pager [phone] 7pm to 7am.  10/08/2015, 4:24 PM

## 2015-10-08 NOTE — ED Notes (Signed)
2L per N/C applied to pt and stats improved to 94% and pt stated relief.

## 2015-10-08 NOTE — ED Provider Notes (Signed)
CSN: [id number]     Arrival date & time 10/08/15  1135 History  By signing my name below, I, Tracey Dudley, attest that this documentation has been prepared under the direction and in the presence of Tracey Hutching, MD . Electronically Signed: Freida Dudley, Scribe. 10/08/2015. 12:43 PM.      Chief Complaint  Patient presents with  . Shortness of Breath     The history is provided by the patient. No language interpreter was used.   HPI Comments:  Tracey Dudley is a 70 y.o. female with a history of COPD, who presents to the Emergency Department complaining of progressively worsening moderate SOB x ~ 5 days She notes episode began while at work. Her symptom is exacerbated with exertion and better when at rest. She was seen in ED for same on 09/02/15 and placed on 12 day course of prednisone which improved her SOB. She states symptoms returned ~ 5 days after completing the course. She notes this is her second hospital visit for SOB. She has been using at neb treatment at home ~ 3 times a day with minimal relief.  Pulmonolgist at [facility]   Past Medical History  Diagnosis Date  . A-fib (HCC)   . COPD (chronic obstructive pulmonary disease) (HCC)   . Herpes zoster   . Hypertension    Past Surgical History  Procedure Laterality Date  . Abdominal hysterectomy     History reviewed. No pertinent family history. Social History  Substance Use Topics  . Smoking status: Former Games developer  . Smokeless tobacco: None  . Alcohol Use: No   OB History    Gravida Para Term Preterm AB TAB SAB Ectopic Multiple Living            1     Review of Systems  10 systems reviewed and all are negative for acute change except as noted in the HPI.   Allergies  Metoprolol and Erythromycin  Home Medications   Prior to Admission medications   Medication Sig Start Date End Date Taking? Authorizing Provider  acetaminophen (TYLENOL) 500 MG tablet Take 1,000 mg by mouth every 6 (six) hours as needed for  moderate pain.   Yes Historical Provider, MD  Aclidinium Bromide (TUDORZA PRESSAIR) 400 MCG/ACT AEPB Inhale 1 puff into the lungs daily.   Yes Historical Provider, MD  albuterol (PROVENTIL HFA;VENTOLIN HFA) 108 (90 BASE) MCG/ACT inhaler Inhale 1 puff into the lungs every 6 (six) hours as needed for wheezing or shortness of breath.   Yes Historical Provider, MD  ALPRAZolam Prudy Feeler) 0.5 MG tablet Take 0.5 mg by mouth 3 (three) times daily as needed for anxiety.   Yes Historical Provider, MD  atorvastatin (LIPITOR) 20 MG tablet Take 20 mg by mouth daily.   Yes Historical Provider, MD  Azilsartan Medoxomil (EDARBI) 80 MG TABS Take 1 tablet by mouth daily.   Yes Historical Provider, MD  dofetilide (TIKOSYN) 250 MCG capsule Take 250 mcg by mouth 2 (two) times daily.   Yes Historical Provider, MD  fluticasone-salmeterol (ADVAIR HFA) 115-21 MCG/ACT inhaler Inhale 2 puffs into the lungs 2 (two) times daily.   Yes Historical Provider, MD  furosemide (LASIX) 20 MG tablet Take 20 mg by mouth daily.   Yes Historical Provider, MD  hydrALAZINE (APRESOLINE) 100 MG tablet Take 100 mg by mouth 2 (two) times daily.   Yes Historical Provider, MD  Nebivolol HCl (BYSTOLIC) 20 MG TABS Take 1 tablet by mouth daily.   Yes Historical Provider, MD  predniSONE (DELTASONE) 5 MG tablet Take 5 mg by mouth daily with breakfast.   Yes Historical Provider, MD  rivaroxaban (XARELTO) 20 MG TABS tablet Take 20 mg by mouth daily with supper.   Yes Historical Provider, MD  predniSONE (STERAPRED UNI-PAK 21 TAB) 10 MG (21) TBPK tablet Take 1 tablet (10 mg total) by mouth daily. Take 6 tabs by mouth daily  for 2 days, then 5 tabs for 2 days, then 4 tabs for 2 days, then 3 tabs for 2 days, 2 tabs for 2 days, then 1 tab by mouth daily for 2 days Patient not taking: Reported on 10/08/2015 09/02/15   Tracey Nay, MD   BP 188/83 mmHg  Pulse 65  Temp(Src) 98.7 F (37.1 C) (Oral)  Resp 14  Ht  (1.676 m)  Wt 172 lb (78.019 kg)  BMI 27.77  kg/m2  SpO2 99% Physical Exam  Constitutional: She is oriented to person, place, and time. She appears well-developed and well-nourished.  HENT:  Head: Normocephalic and atraumatic.  Eyes: Conjunctivae and EOM are normal. Pupils are equal, round, and reactive to light.  Neck: Normal range of motion. Neck supple.  Cardiovascular: Normal rate and regular rhythm.   Pulmonary/Chest: Effort normal. She has wheezes.  Minimally dyspnic  Bilateral expiratory wheezing  Abdominal: Soft. Bowel sounds are normal.  Musculoskeletal: Normal range of motion.  Neurological: She is alert and oriented to person, place, and time.  Skin: Skin is warm and dry. There is pallor.  Psychiatric: She has a normal mood and affect. Her behavior is normal.  Nursing note and vitals reviewed.   ED Course  Procedures  DIAGNOSTIC STUDIES:  Oxygen Saturation is 94% on [place], adequate by my interpretation.    COORDINATION OF CARE:  12:15 PM Will order CXR,  breathing tx and steroid. Discussed treatment plan with pt and family at bedside and pt agreed to plan.  Labs Review Labs Reviewed  CBC WITH DIFFERENTIAL/PLATELET - Abnormal; Notable for the following:    RBC 3.62 (*)    Hemoglobin 11.4 (*)    MCV 101.1 (*)    Neutro Abs 8.9 (*)    Lymphs Abs 0.5 (*)    All other components within normal limits  COMPREHENSIVE METABOLIC PANEL - Abnormal; Notable for the following:    Chloride 100 (*)    CO2 36 (*)    Glucose, Bld 113 (*)    Albumin 3.2 (*)    Total Bilirubin 1.5 (*)    All other components within normal limits  BRAIN NATRIURETIC PEPTIDE - Abnormal; Notable for the following:    B Natriuretic Peptide 735.0 (*)    All other components within normal limits    Imaging Review Dg Chest 2 View  10/08/2015  CLINICAL DATA:  70 year old female with shortness of breath. Clinical history includes COPD and asthma EXAM: CHEST  2 VIEW COMPARISON:  Prior chest x-ray 09/02/2015 FINDINGS: Stable cardiac and mediastinal  contours. Atherosclerotic calcifications again noted in the transverse aorta. Subtle patchy airspace opacity in the right lower lobe best seen on the frontal view is an interval finding. The lungs are slightly hyperinflated. Stable chronic bronchitic change and diffuse mild interstitial prominence. No pleural effusion or pneumothorax. No acute osseous abnormality. IMPRESSION: Patchy airspace opacity in the right lower lobe concerning for bronchopneumonia. Followup PA and lateral chest X-ray is recommended in 3-4 weeks following trial of antibiotic therapy to ensure resolution and exclude underlying malignancy. Stable background pulmonary parenchymal changes consistent with the clinical history  of COPD. Electronically Signed   By: Malachy Moan M.D.   On: 10/08/2015 13:38   I have personally reviewed and evaluated these images and lab results as part of my medical decision-making.   EKG Interpretation   Date/Time:  Saturday October 08 2015 12:12:45 EST Ventricular Rate:  61 PR Interval:  130 QRS Duration: 99 QT Interval:  461 QTC Calculation: 464 R Axis:   99 Text Interpretation:  Sinus rhythm Ventricular premature complex Right  axis deviation Confirmed by Jozlyn Schatz  MD, Aneesah Hernan ([id number]) on 10/08/2015 2:13:20  PM      MDM   Final diagnoses:  Community acquired pneumonia    Patient has known COPD and is on chronic steroid therapy. She has profound dyspnea. Chest x-ray reveals a right lower lobe infiltrate. Nebulizer treatment, IV Zithromax, IV Rocephin. Admit.  I personally performed the services described in this documentation, which was scribed in my presence. The recorded information has been reviewed and is accurate.    Tracey Hutching, MD 10/08/15 1431

## 2015-10-08 NOTE — ED Notes (Signed)
Report given to [facility] on Dept 300, all questions answered.

## 2015-10-08 NOTE — ED Notes (Signed)
PT c/o increased SOB even at rest x2 days and using her PRN oxygen more frequently. PT states her pulmonologist changed her steroids and inhalers x1 week ago. PT 82% on room air.

## 2015-10-09 DIAGNOSIS — J441 Chronic obstructive pulmonary disease with (acute) exacerbation: Secondary | ICD-10-CM

## 2015-10-09 DIAGNOSIS — I480 Paroxysmal atrial fibrillation: Secondary | ICD-10-CM

## 2015-10-09 LAB — CBC
HCT: 35.3 % — ABNORMAL LOW (ref 36.0–46.0)
HEMOGLOBIN: 11.1 g/dL — AB (ref 12.0–15.0)
MCH: 31.3 pg (ref 26.0–34.0)
MCHC: 31.4 g/dL (ref 30.0–36.0)
MCV: 99.4 fL (ref 78.0–100.0)
Platelets: 227 10*3/uL (ref 150–400)
RBC: 3.55 MIL/uL — ABNORMAL LOW (ref 3.87–5.11)
RDW: 13.8 % (ref 11.5–15.5)
WBC: 9.8 10*3/uL (ref 4.0–10.5)

## 2015-10-09 LAB — BASIC METABOLIC PANEL
ANION GAP: 11 (ref 5–15)
BUN: 23 mg/dL — ABNORMAL HIGH (ref 6–20)
CALCIUM: 8.9 mg/dL (ref 8.9–10.3)
CO2: 40 mmol/L — AB (ref 22–32)
CREATININE: 1.26 mg/dL — AB (ref 0.44–1.00)
Chloride: 93 mmol/L — ABNORMAL LOW (ref 101–111)
GFR calc Af Amer: 50 mL/min — ABNORMAL LOW (ref >60)
GFR calc non Af Amer: 43 mL/min — ABNORMAL LOW (ref >60)
GLUCOSE: 156 mg/dL — AB (ref 65–99)
Potassium: 3.5 mmol/L (ref 3.5–5.1)
Sodium: 144 mmol/L (ref 135–145)

## 2015-10-09 NOTE — Progress Notes (Signed)
[facility] PROGRESS NOTE  Tracey Dudley BJY:[id number] DOB: 02-04-1946    PCP:   No primary care provider on file.   HPI:  Tracey Dudley is an 70 y.o. female with hx of afib, s/p prior cardioversion, on Xarelto and Tykosin, hx of COPD on home oxygen, prior tobacco abuse, hx of HTN, presented to the ER with 4 days hx of coughs, SOB, wheezing, but no fever, chills, or CP. She denied DM or hx of CHF. Evaluation in the ER showed CXR with possible PNA, BNP of 700's and normal WBC and renal fx tests. She was started on IV rocephin and IV Zithromax, given IV steriods, and neb and hospitalist was asked to admit her for COPD exacerbation with CAP. She has slow but discernable improvement.  She wishes to have local pulmonary doctor follow up.  She would like to ask Dr Tracey Dudley to see her.     Rewiew of Systems:  Constitutional: Negative for malaise, fever and chills. No significant weight loss or weight gain Eyes: Negative for eye pain, redness and discharge, diplopia, visual changes, or flashes of light. ENMT: Negative for ear pain, hoarseness, nasal congestion, sinus pressure and sore throat. No headaches; tinnitus, drooling, or problem swallowing. Cardiovascular: Negative for chest pain, palpitations, diaphoresis, dyspnea and peripheral edema. ; No orthopnea, PND Respiratory: Negative for cough, hemoptysis, wheezing and stridor. No pleuritic chestpain. Gastrointestinal: Negative for nausea, vomiting, diarrhea, constipation, abdominal pain, melena, blood in stool, hematemesis, jaundice and rectal bleeding.    Genitourinary: Negative for frequency, dysuria, incontinence,flank pain and hematuria; Musculoskeletal: Negative for back pain and neck pain. Negative for swelling and trauma.;  Skin: . Negative for pruritus, rash, abrasions, bruising and skin lesion.; ulcerations Neuro: Negative for headache, lightheadedness and neck stiffness. Negative for weakness, altered level of consciousness , altered  mental status, extremity weakness, burning feet, involuntary movement, seizure and syncope.  Psych: negative for anxiety, depression, insomnia, tearfulness, panic attacks, hallucinations, paranoia, suicidal or homicidal ideation    Past Medical History  Diagnosis Date  . A-fib (HCC)   . COPD (chronic obstructive pulmonary disease) (HCC)   . Herpes zoster   . Hypertension     Past Surgical History  Procedure Laterality Date  . Abdominal hysterectomy      Medications:  HOME MEDS: Prior to Admission medications   Medication Sig Start Date End Date Taking? Authorizing Provider  acetaminophen (TYLENOL) 500 MG tablet Take 1,000 mg by mouth every 6 (six) hours as needed for moderate pain.   Yes Historical Provider, MD  Aclidinium Bromide (TUDORZA PRESSAIR) 400 MCG/ACT AEPB Inhale 1 puff into the lungs daily.   Yes Historical Provider, MD  albuterol (PROVENTIL HFA;VENTOLIN HFA) 108 (90 BASE) MCG/ACT inhaler Inhale 1 puff into the lungs every 6 (six) hours as needed for wheezing or shortness of breath.   Yes Historical Provider, MD  ALPRAZolam Prudy Feeler) 0.5 MG tablet Take 0.5 mg by mouth 3 (three) times daily as needed for anxiety.   Yes Historical Provider, MD  atorvastatin (LIPITOR) 20 MG tablet Take 20 mg by mouth daily.   Yes Historical Provider, MD  Azilsartan Medoxomil (EDARBI) 80 MG TABS Take 1 tablet by mouth daily.   Yes Historical Provider, MD  dofetilide (TIKOSYN) 250 MCG capsule Take 250 mcg by mouth 2 (two) times daily.   Yes Historical Provider, MD  fluticasone-salmeterol (ADVAIR HFA) 115-21 MCG/ACT inhaler Inhale 2 puffs into the lungs 2 (two) times daily.   Yes Historical Provider, MD  furosemide (LASIX) 20 MG tablet  Take 20 mg by mouth daily.   Yes Historical Provider, MD  hydrALAZINE (APRESOLINE) 100 MG tablet Take 100 mg by mouth 2 (two) times daily.   Yes Historical Provider, MD  Nebivolol HCl (BYSTOLIC) 20 MG TABS Take 1 tablet by mouth daily.   Yes Historical Provider, MD   predniSONE (DELTASONE) 5 MG tablet Take 5 mg by mouth daily with breakfast.   Yes Historical Provider, MD  rivaroxaban (XARELTO) 20 MG TABS tablet Take 20 mg by mouth daily with supper.   Yes Historical Provider, MD  predniSONE (STERAPRED UNI-PAK 21 TAB) 10 MG (21) TBPK tablet Take 1 tablet (10 mg total) by mouth daily. Take 6 tabs by mouth daily  for 2 days, then 5 tabs for 2 days, then 4 tabs for 2 days, then 3 tabs for 2 days, 2 tabs for 2 days, then 1 tab by mouth daily for 2 days Patient not taking: Reported on 10/08/2015 09/02/15   [name], MD     Allergies:  Allergies  Allergen Reactions  . Metoprolol Shortness Of Breath and Other (See Comments)    Tiredness  . Erythromycin Nausea Only    Social History:   reports that she has quit smoking. She does not have any smokeless tobacco history on file. She reports that she does not drink alcohol or use illicit drugs.  Family History: History reviewed. No pertinent family history.   Physical Exam: Filed Vitals:   10/09/15 0748 10/09/15 0842 10/09/15 1334 10/09/15 1408  BP:  159/72  149/53  Pulse:  86  86  Temp:    98 F (36.7 C)  TempSrc:    Oral  Resp:    18  Height:      Weight:      SpO2: 97% 98% 97% 96%   Blood pressure 149/53, pulse 86, temperature 98 F (36.7 C), temperature source Oral, resp. rate 18, height  (1.676 m), weight 78.2 kg (172 lb 6.4 oz), SpO2 96 %.  GEN:  Pleasant  patient lying in the stretcher in no acute distress; cooperative with exam. PSYCH:  alert and oriented x4; does not appear anxious or depressed; affect is appropriate. HEENT: Mucous membranes pink and anicteric; PERRLA; EOM intact; no cervical lymphadenopathy nor thyromegaly or carotid bruit; no JVD; There were no stridor. Neck is very supple. Breasts:: Not examined CHEST WALL: No tenderness CHEST: Normal respiration, clear to auscultation bilaterally.  HEART: Regular rate and rhythm.  There are no murmur, rub, or gallops.    BACK: No kyphosis or scoliosis; no CVA tenderness ABDOMEN: soft and non-tender; no masses, no organomegaly, normal abdominal bowel sounds; no pannus; no intertriginous candida. There is no rebound and no distention. Rectal Exam: Not done EXTREMITIES: No bone or joint deformity; age-appropriate arthropathy of the hands and knees; no edema; no ulcerations.  There is no calf tenderness. Genitalia: not examined PULSES: 2+ and symmetric SKIN: Normal hydration no rash or ulceration CNS: Cranial nerves 2-12 grossly intact no focal lateralizing neurologic deficit.  Speech is fluent; uvula elevated with phonation, facial symmetry and tongue midline. DTR are normal bilaterally, cerebella exam is intact, barbinski is negative and strengths are equaled bilaterally.  No sensory loss.   Labs on Admission:  Basic Metabolic Panel:  Recent Labs Lab 10/08/15 1222 10/09/15 0541  NA 144 144  K 4.0 3.5  CL 100* 93*  CO2 36* 40*  GLUCOSE 113* 156*  BUN 14 23*  CREATININE 0.93 1.26*  CALCIUM 9.1 8.9   Liver  Function Tests:  Recent Labs Lab 10/08/15 1222  AST 18  ALT 20  ALKPHOS 72  BILITOT 1.5*  PROT 6.7  ALBUMIN 3.2*   CBC:  Recent Labs Lab 10/08/15 1222 10/09/15 0541  WBC 10.4 9.8  NEUTROABS 8.9*  --   HGB 11.4* 11.1*  HCT 36.6 35.3*  MCV 101.1* 99.4  PLT 208 227    Radiological Exams on Admission: Dg Chest 2 View  10/08/2015  CLINICAL DATA:  70 year old female with shortness of breath. Clinical history includes COPD and asthma EXAM: CHEST  2 VIEW COMPARISON:  Prior chest x-ray 09/02/2015 FINDINGS: Stable cardiac and mediastinal contours. Atherosclerotic calcifications again noted in the transverse aorta. Subtle patchy airspace opacity in the right lower lobe best seen on the frontal view is an interval finding. The lungs are slightly hyperinflated. Stable chronic bronchitic change and diffuse mild interstitial prominence. No pleural effusion or pneumothorax. No acute osseous  abnormality. IMPRESSION: Patchy airspace opacity in the right lower lobe concerning for bronchopneumonia. Followup PA and lateral chest X-ray is recommended in 3-4 weeks following trial of antibiotic therapy to ensure resolution and exclude underlying malignancy. Stable background pulmonary parenchymal changes consistent with the clinical history of COPD. Electronically Signed   By: Malachy Moan M.D.   On: 10/08/2015 13:38    EKG: Independently reviewed.   Assessment/Plan Present on Admission:  . CAP (community acquired pneumonia) . A-fib (HCC) . HTN (hypertension) . COPD with exacerbation (HCC)  PLAN:  COPD Exacerbation with CAP: WIll continue with oxygen, nebs, IV steroids, and IV antibiotics. Will see if we can have her follow up with Dr Tracey Dudley.  HTN: Will continue her BP meds.  Mild CHF, likely diastolic. Will give her IV lasix. Low dose. She has her cardiologist in [place]. Her ECHO was done over 3 years ago, will obtain ECHO.  Afib: Continue Tykosin. Continue with Xarelto.   Other plans as per orders. Code Status: FULL Unk Lightning, MD.  FACP [facility] Pager [phone] 7pm to 7am.  10/09/2015, 3:45 PM

## 2015-10-10 ENCOUNTER — Inpatient Hospital Stay (HOSPITAL_COMMUNITY): Payer: PRIVATE HEALTH INSURANCE

## 2015-10-10 DIAGNOSIS — I509 Heart failure, unspecified: Secondary | ICD-10-CM

## 2015-10-10 MED ORDER — LEVOFLOXACIN 500 MG PO TABS: 500.0000 mg | ORAL_TABLET | Freq: Every day | ORAL | Status: DC

## 2015-10-10 MED ORDER — POTASSIUM CHLORIDE CRYS ER 20 MEQ PO TBCR
40.0000 meq | EXTENDED_RELEASE_TABLET | Freq: Every day | ORAL | Status: DC
  Administered 2015-10-10: 40 meq via ORAL
  Filled 2015-10-10: qty 2

## 2015-10-10 MED ORDER — PREDNISONE 20 MG PO TABS: 20.0000 mg | ORAL_TABLET | Freq: Two times a day (BID) | ORAL | Status: DC

## 2015-10-10 MED ORDER — ALBUTEROL SULFATE (2.5 MG/3ML) 0.083% IN NEBU: 2.5000 mg | INHALATION_SOLUTION | Freq: Three times a day (TID) | RESPIRATORY_TRACT | Status: DC

## 2015-10-10 NOTE — Care Management Note (Addendum)
Case Management Note  Patient Details  Name: Tracey Dudley MRN: [id number] Date of Birth: 1946-02-03  Subjective/Objective:                  Admitted with CAP. Pt is from home, lives with husband and is ind with ADL's. Pt works full time. Pt has neb machine and home O2 with port tanks from [place]. Pt uses O2 as night and as needed. Pt's PCP Cliffton Asters, NP at [facility]. Pt will be made f/u appointment with pulmonologist at DC. DC anticipated today.   Action/Plan: No CM needs.   Expected Discharge Date:      10/10/2015            Expected Discharge Plan:  Home/Self Care  In-House Referral:  NA  Discharge planning Services  CM Consult  Post Acute Care Choice:  NA Choice offered to:  NA  DME Arranged:    DME Agency:     HH Arranged:    HH Agency:     Status of Service:  Completed, signed off  Medicare Important Message Given:    Date Medicare IM Given:    Medicare IM give by:    Date Additional Medicare IM Given:    Additional Medicare Important Message give by:     If discussed at Long Length of Stay Meetings, dates discussed:    Additional Comments:  Malcolm Metro, RN 10/10/2015, 11:48 AM

## 2015-10-10 NOTE — Progress Notes (Signed)
Discharge instructions given on medications,and follow up visits,patient verbalized understanding. Prescription sent to Pharmacy of choice documented on AVS. No c/o pain or discomfort noted. Accompanied by staff to an awaiting vehicle.

## 2015-10-10 NOTE — Discharge Summary (Signed)
Physician Discharge Summary  Tracey Dudley AVW:[id number] DOB: 12-22-1945 DOA: 10/08/2015  PCP: No primary care provider on file.  Admit date: 10/08/2015 Discharge date: 10/10/2015  Time spent: 35 minutes  Recommendations for Outpatient Follow-up:  1. Follow up with PCP in [place]. 2. Follow up with Dr Juanetta Gosling as arranged.    Discharge Diagnoses:  Principal Problem:   CAP (community acquired pneumonia) Active Problems:   A-fib (HCC)   HTN (hypertension)   COPD with exacerbation (HCC)   Discharge Condition: improved.  No SOB. No fever or chills.   Diet recommendation: cardiac diet.   Filed Weights   10/08/15 1153 10/08/15 1630  Weight: 78.019 kg (172 lb) 78.2 kg (172 lb 6.4 oz)    History of present illness: patient was admitted by me on Oct 08, 2015 for PNA, COPD exacerbation, and mild diastolic CHF.  As per my H and P:  " Tracey Dudley is an 70 y.o. female with hx of afib, s/p prior cardioversion, on Xarelto and Tykosin, hx of COPD on home oxygen, prior tobacco abuse, hx of HTN, presented to the ER with 4 days hx of coughs, SOB, wheezing, but no fever, chills, or CP. She denied DM or hx of CHF. Evaluation in the ER showed CXR with possible PNA, BNP of 700's and normal WBC and renal fx tests. She was started on IV rocephin and IV Zithromax, given IV steriods, and neb and hospitalist was asked to admit her for COPD exacerbation with CAP.    Hospital Course:  Patient was admitted and started on IV Rocephin and Zithromax.   She was given supplemental oxygen, as she has home oxygen therapy.  She was given IV Steroids, and slight amount of IV lasix.  She progressed nicely and uneventfully, with no SOB, fever, or chills the next day.  She was continued on her Duard Brady, and her Xarelto. Her K was 3.5 and she was given 40 mEq of KCL supplement.  She will have an ECHO prior to being discharged, though I suspect she has very mild diastolic dysfx only.  She will be discharged on Levoquin  for  another 7 days, along with Prednisone  BID for 3 days.  She wishes to follow up with Dr Juanetta Gosling and appointment was arranged.   She will follow up with her PCP in [place] as well.  Work excuse was given to her until Feb 20th, 2017.  Thank you for allowing me to participate in her care.  Good Day.    Consultations:  NONE.    Discharge Exam: Filed Vitals:   10/09/15 2107 10/10/15 0549  BP: 144/67 144/52  Pulse: 77 66  Temp: 98.7 F (37.1 C) 98.6 F (37 C)  Resp: 17 17     Discharge Instructions   Discharge Instructions    Diet - low sodium heart healthy    Complete by:  As directed      Discharge instructions    Complete by:  As directed   Take your medicine as directed.  Follow up with your PCP in [place], and see Dr Blase Mess in follow up.     Increase activity slowly    Complete by:  As directed           Current Discharge Medication List    START taking these medications   Details  levofloxacin (LEVAQUIN) 500 MG tablet Take 1 tablet (500 mg total) by mouth daily. Qty: 5 tablet, Refills: 0      CONTINUE these medications which have  NOT CHANGED   Details  acetaminophen (TYLENOL) 500 MG tablet Take 1,000 mg by mouth every 6 (six) hours as needed for moderate pain.    Aclidinium Bromide (TUDORZA PRESSAIR) 400 MCG/ACT AEPB Inhale 1 puff into the lungs daily.    albuterol (PROVENTIL HFA;VENTOLIN HFA) 108 (90 BASE) MCG/ACT inhaler Inhale 1 puff into the lungs every 6 (six) hours as needed for wheezing or shortness of breath.    ALPRAZolam (XANAX) 0.5 MG tablet Take 0.5 mg by mouth 3 (three) times daily as needed for anxiety.    atorvastatin (LIPITOR) 20 MG tablet Take 20 mg by mouth daily.    Azilsartan Medoxomil (EDARBI) 80 MG TABS Take 1 tablet by mouth daily.    dofetilide (TIKOSYN) 250 MCG capsule Take 250 mcg by mouth 2 (two) times daily.    fluticasone-salmeterol (ADVAIR HFA) 115-21 MCG/ACT inhaler Inhale 2 puffs into the lungs 2 (two) times daily.    furosemide  (LASIX) 20 MG tablet Take 20 mg by mouth daily.    hydrALAZINE (APRESOLINE) 100 MG tablet Take 100 mg by mouth 2 (two) times daily.    Nebivolol HCl (BYSTOLIC) 20 MG TABS Take 1 tablet by mouth daily.    rivaroxaban (XARELTO) 20 MG TABS tablet Take 20 mg by mouth daily with supper.      STOP taking these medications     predniSONE (DELTASONE) 5 MG tablet      predniSONE (STERAPRED UNI-PAK 21 TAB) 10 MG (21) TBPK tablet        Allergies  Allergen Reactions  . Metoprolol Shortness Of Breath and Other (See Comments)    Tiredness  . Erythromycin Nausea Only   Follow-up Information    Follow up with Fredirick Maudlin, MD On 10/19/2015.   Specialty:  Pulmonary Disease   Why:  appointment time at 2:00 PM..   Contact information:   [address] [place] [address] [phone]        The results of significant diagnostics from this hospitalization (including imaging, microbiology, ancillary and laboratory) are listed below for reference.    Significant Diagnostic Studies: Dg Chest 2 View  10/08/2015  CLINICAL DATA:  70 year old female with shortness of breath. Clinical history includes COPD and asthma EXAM: CHEST  2 VIEW COMPARISON:  Prior chest x-ray 09/02/2015 FINDINGS: Stable cardiac and mediastinal contours. Atherosclerotic calcifications again noted in the transverse aorta. Subtle patchy airspace opacity in the right lower lobe best seen on the frontal view is an interval finding. The lungs are slightly hyperinflated. Stable chronic bronchitic change and diffuse mild interstitial prominence. No pleural effusion or pneumothorax. No acute osseous abnormality. IMPRESSION: Patchy airspace opacity in the right lower lobe concerning for bronchopneumonia. Followup PA and lateral chest X-ray is recommended in 3-4 weeks following trial of antibiotic therapy to ensure resolution and exclude underlying malignancy. Stable background pulmonary parenchymal changes consistent  with the clinical history of COPD. Electronically Signed   By: Malachy Moan M.D.   On: 10/08/2015 13:38     Labs: Basic Metabolic Panel:  Recent Labs Lab 10/08/15 1222 10/09/15 0541  NA 144 144  K 4.0 3.5  CL 100* 93*  CO2 36* 40*  GLUCOSE 113* 156*  BUN 14 23*  CREATININE 0.93 1.26*  CALCIUM 9.1 8.9   Liver Function Tests:  Recent Labs Lab 10/08/15 1222  AST 18  ALT 20  ALKPHOS 72  BILITOT 1.5*  PROT 6.7  ALBUMIN 3.2*   CBC:  Recent Labs Lab 10/08/15 1222  10/09/15 0541  WBC 10.4 9.8  NEUTROABS 8.9*  --   HGB 11.4* 11.1*  HCT 36.6 35.3*  MCV 101.1* 99.4  PLT 208 227    Recent Labs  09/02/15 1145 10/08/15 1222  BNP 403.0* 735.0*    SignedHouston Siren MD.  [facility] 10/10/2015, 12:51 PM

## 2015-11-04 ENCOUNTER — Ambulatory Visit (INDEPENDENT_AMBULATORY_CARE_PROVIDER_SITE_OTHER): Payer: PRIVATE HEALTH INSURANCE | Admitting: Cardiology

## 2015-11-04 ENCOUNTER — Encounter: Payer: Self-pay | Admitting: Cardiology

## 2015-11-04 VITALS — BP 138/80 | HR 63 | Ht 65.0 in | Wt 181.0 lb

## 2015-11-04 DIAGNOSIS — J449 Chronic obstructive pulmonary disease, unspecified: Secondary | ICD-10-CM

## 2015-11-04 DIAGNOSIS — I10 Essential (primary) hypertension: Secondary | ICD-10-CM

## 2015-11-04 DIAGNOSIS — E785 Hyperlipidemia, unspecified: Secondary | ICD-10-CM

## 2015-11-04 DIAGNOSIS — I480 Paroxysmal atrial fibrillation: Secondary | ICD-10-CM | POA: Diagnosis not present

## 2015-11-04 NOTE — Patient Instructions (Signed)
Your physician recommends that you schedule a follow-up appointment in:  3 months with DrMcDowell  STOP Lasix   Get lab work Magnesium,BMET      Thank you for choosing [facility] !

## 2015-11-04 NOTE — Progress Notes (Signed)
Cardiology Office Note  Date: 11/04/2015   ID: Tracey Dudley, DOB 03-22-1946, MRN [id number]  PCP: Lenoria Chime, FNP  Referring provider: Kari Baars, MD Consulting Cardiologist: Nona Dell, MD   Chief Complaint  Patient presents with  . Atrial Fibrillation    History of Present Illness: Tracey Dudley is a 70 y.o. female referred for cardiology consultation by Dr. Juanetta Gosling. I reviewed her available records and updated the chart. She has a history of paroxysmal to persistent atrial fibrillation diagnosed back in 2013, CHADSVASC score 3, on Xarelto for stroke prophylaxis. She has been managed with a strategy of heart rate controlled recently, followed by cardiology in [place], including input from [facility] (Dr. Christin Fudge - last seen in April 2016). Pan at that time was rhythm management with Tikosyn. She tells me that she has been in sinus rhythm since being on Tikosyn.  She is here today with her sister. She states that she is changing cardiology practices to consolidate her care through the [facility] system. She will still be following with her gynecologist and primary care provider in [place].  I reviewed her medications in detail. It sounds like she was having significant trouble with uncontrolled hypertension resulting in an uptitration of several of her medications over the last few months. She was taken off HCTZ when she started on Tikosyn, however was placed on Lasix ultimately as part of her antihypertensive regimen. She does not report any problems with swelling. She has been frustrated with Lasix due to increased urinary frequency. She has not been on a potassium supplement. Her last potassium was 3.5.  Prior cardiac testing reportedly includes an exercise myocardial perfusion study in April 2015 that was negative for ischemia with LVEF 69%. She had a recent echocardiogram which is outlined below. I discussed the results with her.  It sounds like she has had good  control of her atrial fibrillation at least over the last 6 months on current regimen. She has had some concerns about staying on Tikosyn, but has elected to make no changes at this point. We did discuss seeing if she could stop Lasix altogether. Interestingly, she does tell me that she went through a several month period of time when she was in atrial fibrillation and was not overly symptomatic.  Past Medical History  Diagnosis Date  . Essential hypertension   . COPD (chronic obstructive pulmonary disease) (HCC)   . Herpes zoster   . Vitamin D deficiency   . Hyperlipidemia   . Anxiety   . PAF (paroxysmal atrial fibrillation) (HCC)     Followed by Dr. Leonie Green in [place], also [facility] assessment by Dr. Christin Fudge    Past Surgical History  Procedure Laterality Date  . Abdominal hysterectomy    . Cataract extraction      Current Outpatient Prescriptions  Medication Sig Dispense Refill  . acetaminophen (TYLENOL) 500 MG tablet Take 1,000 mg by mouth every 6 (six) hours as needed for moderate pain.    Marland Kitchen albuterol (PROVENTIL HFA;VENTOLIN HFA) 108 (90 BASE) MCG/ACT inhaler Inhale 1 puff into the lungs every 6 (six) hours as needed for wheezing or shortness of breath.    . ALPRAZolam (XANAX) 0.5 MG tablet Take 0.5 mg by mouth 3 (three) times daily as needed for anxiety.    Marland Kitchen atorvastatin (LIPITOR) 20 MG tablet Take 20 mg by mouth daily.    . Azilsartan Medoxomil (EDARBI) 80 MG TABS Take 1 tablet by mouth daily.    Marland Kitchen  BREO ELLIPTA 100-25 MCG/INH AEPB INHALE 1 PUFF BY MOUTH EVERY DAY  0  . dofetilide (TIKOSYN) 250 MCG capsule Take 250 mcg by mouth 2 (two) times daily.    . fluticasone-salmeterol (ADVAIR HFA) 115-21 MCG/ACT inhaler Inhale 2 puffs into the lungs 2 (two) times daily.    . hydrALAZINE (APRESOLINE) 100 MG tablet Take 100 mg by mouth 2 (two) times daily.    . INCRUSE ELLIPTA 62.5 MCG/INH AEPB INHALE 1 PUFF BY MOUTH EVERY DAY  0  . Nebivolol HCl (BYSTOLIC) 20 MG TABS Take 1 tablet by mouth  daily.    . rivaroxaban (XARELTO) 20 MG TABS tablet Take 20 mg by mouth daily with supper.     No current facility-administered medications for this visit.   Allergies:  Metoprolol and Erythromycin   Social History: The patient  reports that she quit smoking about 17 months ago. Her smoking use included Cigarettes. She started smoking about 38 years ago. She has never used smokeless tobacco. She reports that she does not drink alcohol or use illicit drugs.   Family History: The patient's family history includes Asthma in her mother; Breast cancer in her sister; CAD in her father.   ROS:  Please see the history of present illness. Otherwise, complete review of systems is positive for COPD with intermittent wheezing. Recent admission to the hospital in January with pneumonia. All other systems are reviewed and negative.   Physical Exam: VS:  BP 138/80 mmHg  Pulse 63  Ht 5\' 5"  (1.651 m)  Wt 181 lb (82.101 kg)  BMI 30.12 kg/m2  SpO2 90%, BMI Body mass index is 30.12 kg/(m^2).  Wt Readings from Last 3 Encounters:  11/04/15 181 lb (82.101 kg)  10/08/15 172 lb 6.4 oz (78.2 kg)  09/02/15 168 lb (76.204 kg)    General: Overweight woman, appears comfortable at rest. HEENT: Conjunctiva and lids normal, oropharynx clear. Neck: Supple, no elevated JVP or carotid bruits, no thyromegaly. Lungs: Clear to auscultation, nonlabored breathing at rest. Cardiac: Regular rate and rhythm, no S3, soft systolic murmur, no pericardial rub. Abdomen: Soft, nontender, bowel sounds present, no guarding or rebound. Extremities: No pitting edema, distal pulses 2+. Skin: Warm and dry. Musculoskeletal: No kyphosis. Neuropsychiatric: Alert and oriented x3, affect grossly appropriate.  ECG: I personally reviewed the previous ECG from 10/08/2015 which showed sinus rhythm with rightward axis, PVC, and QTc 465 ms.  Recent Labwork: 10/08/2015: ALT 20; AST 18; B Natriuretic Peptide 735.0* 10/09/2015: BUN 23*;  Creatinine, Ser 1.26*; Hemoglobin 11.1*; Platelets 227; Potassium 3.5; Sodium 144   Other Studies Reviewed Today:  Echocardiogram 10/10/2015: Study Conclusions  - Left ventricle: The cavity size was normal. Wall thickness was increased in a pattern of mild LVH. Systolic function was vigorous. The estimated ejection fraction was in the range of 75% to 80%. Wall motion was normal; there were no regional wall motion abnormalities. Features are consistent with a pseudonormal left ventricular filling pattern, with concomitant abnormal relaxation and increased filling pressure (grade 2 diastolic dysfunction). - Aortic valve: Poorly visualized. Mildly calcified annulus. Probably trileaflet. Mean gradient (S): 13 mm Hg. Peak gradient (S): 24 mm Hg. Gradients likely increased due to relatively small LVOT and vigorous contraction. Limited views of valve leaflets show grossly preserved excursion. - Mitral valve: Mildly thickened leaflets . There was mild regurgitation. - Left atrium: The atrium was at the upper limits of normal in size. - Right atrium: Central venous pressure (est): 3 mm Hg. - Tricuspid valve: There was trivial  regurgitation. - Pulmonary arteries: Systolic pressure could not be accurately estimated. - Pericardium, extracardiac: A prominent pericardial fat pad was present.  Impressions:  - Mild LVH with LVEF 75-80% and grade 2 diastolic dysfunction with increased filling pressures. Normal left atrial chamber size. Mildly thickened mitral leaflets with mild mitral regurgitation. Aortic valve appears mildly sclerotic without definitive stenosis. Trivial tricuspid regurgitation, PASP not estimated.  Assessment and Plan:  1. Paroxysmal atrial fibrillation, symptomatically well controlled over the last 6 months since initiation of Tikosyn by [facility] cardiology. She has a CHADSVASC score of 3 and is on Xarelto with no reported major bleeding  problems. I reviewed her recent ECG as outlined above. Will obtain follow-up BMET and magnesium.  2. Essential hypertension. We reviewed her current medications. She is on a Edarbi, hydralazine, and Bystolic. After discussion today she plans to stop Lasix, and she will we will see how she does in terms of blood pressure and development of any new symptoms.  3. COPD, now followed by Dr. Juanetta Gosling. Pulmonary rehabilitation as planned.  4. Hyperlipidemia, on Lipitor. She follows with primary care and reports good lipid control over time.  Current medicines were reviewed with the patient today.   Orders Placed This Encounter  Procedures  . Basic Metabolic Panel (BMET)  . Magnesium    Disposition: FU with me in 3 months.   Signed, Jonelle Sidle, MD, [facility] 11/04/2015 2:15 PM    [facility] at [facility] [address], [place], [place] [address] Phone: [phone]; Fax: [phone]

## 2015-11-05 LAB — BASIC METABOLIC PANEL
BUN: 13 mg/dL (ref 7–25)
CHLORIDE: 100 mmol/L (ref 98–110)
CO2: 34 mmol/L — AB (ref 20–31)
CREATININE: 1.25 mg/dL — AB (ref 0.50–0.99)
Calcium: 9.2 mg/dL (ref 8.6–10.4)
GLUCOSE: 95 mg/dL (ref 65–99)
Potassium: 3.7 mmol/L (ref 3.5–5.3)
Sodium: 144 mmol/L (ref 135–146)

## 2015-11-05 LAB — MAGNESIUM: Magnesium: 1.9 mg/dL (ref 1.5–2.5)

## 2015-11-08 ENCOUNTER — Telehealth (HOSPITAL_COMMUNITY): Payer: Self-pay | Admitting: *Deleted

## 2015-11-08 NOTE — Telephone Encounter (Signed)
Opened in Error.

## 2015-11-22 ENCOUNTER — Encounter: Payer: Self-pay | Admitting: Cardiology

## 2015-11-23 ENCOUNTER — Encounter (HOSPITAL_COMMUNITY)
Admission: RE | Admit: 2015-11-23 | Discharge: 2015-11-23 | Disposition: A | Discharge status: Home / Self Care | Payer: PRIVATE HEALTH INSURANCE | Source: Ambulatory Visit | Attending: Pulmonary Disease | Admitting: Pulmonary Disease

## 2015-11-23 ENCOUNTER — Encounter (HOSPITAL_COMMUNITY): Payer: Self-pay

## 2015-11-23 VITALS — BP 152/80 | HR 58 | Ht 65.0 in | Wt 173.6 lb

## 2015-11-23 DIAGNOSIS — J449 Chronic obstructive pulmonary disease, unspecified: Secondary | ICD-10-CM | POA: Diagnosis present

## 2015-11-23 DIAGNOSIS — R0602 Shortness of breath: Secondary | ICD-10-CM

## 2015-11-23 NOTE — Progress Notes (Signed)
Pulmonary Individual Treatment Plan  Patient Details  Name: Tracey Dudley MRN: [id number] Date of Birth: Jan 22, 1946 Referring Provider:  Kari Baars, MD  Initial Encounter Date:       PULMONARY REHAB OTHER RESP ORIENTATION from 11/23/2015 in [facility]   Date  11/23/15      Visit Diagnosis: SOB (shortness of breath)  Patient's Home Medications on Admission:   Current outpatient prescriptions:  .  fluticasone furoate-vilanterol (BREO ELLIPTA) 200-25 MCG/INH AEPB, Inhale 1 puff into the lungs daily., Disp: , Rfl:  .  acetaminophen (TYLENOL) 500 MG tablet, Take 1,000 mg by mouth every 6 (six) hours as needed for moderate pain., Disp: , Rfl:  .  albuterol (PROVENTIL HFA;VENTOLIN HFA) 108 (90 BASE) MCG/ACT inhaler, Inhale 1 puff into the lungs every 6 (six) hours as needed for wheezing or shortness of breath., Disp: , Rfl:  .  ALPRAZolam (XANAX) 0.5 MG tablet, Take 0.5 mg by mouth 3 (three) times daily as needed for anxiety., Disp: , Rfl:  .  atorvastatin (LIPITOR) 20 MG tablet, Take 20 mg by mouth daily., Disp: , Rfl:  .  Azilsartan Medoxomil (EDARBI) 80 MG TABS, Take 1 tablet by mouth daily., Disp: , Rfl:  .  dofetilide (TIKOSYN) 250 MCG capsule, Take 250 mcg by mouth 2 (two) times daily., Disp: , Rfl:  .  hydrALAZINE (APRESOLINE) 100 MG tablet, Take 100 mg by mouth 2 (two) times daily., Disp: , Rfl:  .  INCRUSE ELLIPTA 62.5 MCG/INH AEPB, INHALE 1 PUFF BY MOUTH EVERY DAY, Disp: , Rfl: 0 .  Nebivolol HCl (BYSTOLIC) 20 MG TABS, Take 1 tablet by mouth daily., Disp: , Rfl:  .  rivaroxaban (XARELTO) 20 MG TABS tablet, Take 20 mg by mouth daily with supper., Disp: , Rfl:   Past Medical History: Past Medical History  Diagnosis Date  . Essential hypertension   . COPD (chronic obstructive pulmonary disease) (HCC)   . Herpes zoster   . Vitamin D deficiency   . Hyperlipidemia   . Anxiety   . PAF (paroxysmal atrial fibrillation) (HCC)     Followed by Dr. Leonie Green in  [place], also [facility] EP assessment by Dr. Christin Fudge    Tobacco Use: History  Smoking status  . Former Smoker -- 0.50 packs/day for 30 years  . Types: Cigarettes  . Start date: 11/03/1977  . Quit date: 06/01/2014  Smokeless tobacco  . Never Used    Labs:     Recent Review Flowsheet Data    There is no flowsheet data to display.      Capillary Blood Glucose: No results found for: GLUCAP   ADL UCSD:     ADL UCSD      11/23/15 1440       ADL UCSD   ADL Phase Entry     SOB Score total 77     Rest 0     Walk 2     Stairs 5     Bath 3     Dress 3     Shop 3        Pulmonary Function Assessment:     Pulmonary Function Assessment - 11/23/15 1639    Breath   Bilateral Breath Sounds Clear   Shortness of Breath Yes      Exercise Target Goals: Date: 11/23/15  Exercise Program Goal: Individual exercise prescription set with THRR, safety & activity barriers. Participant demonstrates ability to understand and report RPE using BORG scale, to self-measure pulse accurately, and  to acknowledge the importance of the exercise prescription.  Exercise Prescription Goal: Starting with aerobic activity 30 plus minutes a day, 3 days per week for initial exercise prescription. Provide home exercise prescription and guidelines that participant acknowledges understanding prior to discharge.  Activity Barriers & Risk Stratification:     Activity Barriers & Cardiac Risk Stratification - 11/23/15 1637    Activity Barriers & Cardiac Risk Stratification   Activity Barriers None   Cardiac Risk Stratification Low      6 Minute Walk:     6 Minute Walk      11/23/15 1442       6 Minute Walk   Phase Initial     Distance 850 feet     Walk Time 6 minutes     # of Rest Breaks 0     MPH 1.61     METS 2.23     RPE 13     Perceived Dyspnea  11     VO2 Peak 8.87     Symptoms No     Resting HR 58 bpm     Resting BP 152/80 mmHg     Max Ex. HR 91 bpm     Max Ex. BP 198/92  mmHg     Pre/Post BP   Baseline BP 152/80 mmHg     6 Minute BP 198/92 mmHg     2 Minute Post BP 168/88 mmHg     Pre/Post BP? Yes     Interval Oxygen   Interval Oxygen? --  Patient used 2L [place] O2 Contiuous during test        Initial Exercise Prescription:     Initial Exercise Prescription - 11/23/15 1455    Date of Initial Exercise Prescription   Date 11/23/15   Oxygen   Oxygen Continuous   Liters 2   Treadmill   MPH 1   Grade 0   Minutes 15   METs 1.77   NuStep   Level 2   Minutes 15   METs 1.5   Arm Ergometer   Level 1   Minutes 15   METs 1   Prescription Details   Frequency (times per week) 2   Intensity   THRR REST +  20   THRR 40-80% of Max Heartrate 96-115   Ratings of Perceived Exertion 11-13   Perceived Dyspnea 2-4   Progression   Progression Continue to progress workloads to maintain intensity without signs/symptoms of physical distress.   Resistance Training   Training Prescription Yes   Weight 1.0   Reps 10-12      Perform Capillary Blood Glucose checks as needed.  Exercise Prescription Changes:   Discharge Exercise Prescription (Final Exercise Prescription Changes):   Nutrition:  Target Goals: Understanding of nutrition guidelines, daily intake of sodium 1500mg , cholesterol 200mg , calories 30% from fat and 7% or less from saturated fats, daily to have 5 or more servings of fruits and vegetables.  Biometrics:     Pre Biometrics - 11/23/15 1430    Pre Biometrics   Height 5\' 5"  (1.651 m)   Weight 173 lb 9.6 oz (78.744 kg)   Waist Circumference 35 inches   Hip Circumference 39 inches   Waist to Hip Ratio 0.9 %   BMI (Calculated) 28.9   Triceps Skinfold 24 mm   % Body Fat 38.5 %   Grip Strength 66 kg   Flexibility 12 in   Single Leg Stand 24 seconds       Nutrition  Therapy Plan and Nutrition Goals:     Nutrition Therapy & Goals - 11/23/15 1603    Intervention Plan   Intervention Prescribe, educate and counsel regarding  individualized specific dietary modifications aiming towards targeted core components such as weight, hypertension, lipid management, diabetes, heart failure and other comorbidities.;Nutrition handout(s) given to patient.      Nutrition Discharge: Rate Your Plate Scores:     Nutrition Assessments - 11/23/15 1721    Rate Your Plate Scores   Pre Score 55      Psychosocial: Target Goals: Acknowledge presence or absence of depression, maximize coping skills, provide positive support system. Participant is able to verbalize types and ability to use techniques and skills needed for reducing stress and depression.  Initial Review & Psychosocial Screening:     Initial Psych Review & Screening - 11/23/15 1701    Initial Review   Current issues with --  None   Family Dynamics   Good Support System? Yes   Barriers   Psychosocial barriers to participate in program There are no identifiable barriers or psychosocial needs.   Screening Interventions   Interventions Encouraged to exercise      Quality of Life Scores:     Quality of Life - 11/23/15 1435    Quality of Life Scores   Health/Function Pre 17.41 %   Socioeconomic Pre 24.36 %   Psych/Spiritual Pre 22.36 %   Family Pre 28.8 %   GLOBAL Pre 21.41 %      PHQ-9:     Recent Review Flowsheet Data    Depression screen [facility] 2/9 11/23/2015   Decreased Interest 1   Down, Depressed, Hopeless 1   PHQ - 2 Score 2   Altered sleeping 0   Tired, decreased energy 1   Change in appetite 1   Feeling bad or failure about yourself  0   Trouble concentrating 0   Moving slowly or fidgety/restless 0   Suicidal thoughts 0   PHQ-9 Score 4   Difficult doing work/chores Somewhat difficult      Psychosocial Evaluation and Intervention:     Psychosocial Evaluation - 11/23/15 1702    Psychosocial Evaluation & Interventions   Interventions Encouraged to exercise with the program and follow exercise prescription   Continued Psychosocial  Services Needed No      Psychosocial Re-Evaluation:   Education: Education Goals: Education classes will be provided on a weekly basis, covering required topics. Participant will state understanding/return demonstration of topics presented.  Learning Barriers/Preferences:     Learning Barriers/Preferences - 11/23/15 1638    Learning Barriers/Preferences   Learning Barriers None   Learning Preferences Verbal Instruction      Education Topics: How Lungs Work and Diseases: - Discuss the anatomy of the lungs and diseases that can affect the lungs, such as COPD.   Exercise: -Discuss the importance of exercise, FITT principles of exercise, normal and abnormal responses to exercise, and how to exercise safely.   Environmental Irritants: -Discuss types of environmental irritants and how to limit exposure to environmental irritants.   Meds/Inhalers and oxygen: - Discuss respiratory medications, definition of an inhaler and oxygen, and the proper way to use an inhaler and oxygen.   Energy Saving Techniques: - Discuss methods to conserve energy and decrease shortness of breath when performing activities of daily living.    Bronchial Hygiene / Breathing Techniques: - Discuss breathing mechanics, pursed-lip breathing technique,  proper posture, effective ways to clear airways, and other functional breathing techniques  Cleaning Equipment: - Provides group verbal and written instruction about the health risks of elevated stress, cause of high stress, and healthy ways to reduce stress.   Nutrition I: Fats: - Discuss the types of cholesterol, what cholesterol does to the body, and how cholesterol levels can be controlled.   Nutrition II: Labels: -Discuss the different components of food labels and how to read food labels.   Respiratory Infections: - Discuss the signs and symptoms of respiratory infections, ways to prevent respiratory infections, and the importance of  seeking medical treatment when having a respiratory infection.   Stress I: Signs and Symptoms: - Discuss the causes of stress, how stress may lead to anxiety and depression, and ways to limit stress.   Stress II: Relaxation: -Discuss relaxation techniques to limit stress.   Oxygen for Home/Travel: - Discuss how to prepare for travel when on oxygen and proper ways to transport and store oxygen to ensure safety.   Knowledge Questionnaire Score:     Knowledge Questionnaire Score - 11/23/15 1638    Knowledge Questionnaire Score   Pre Score 9/14      Personal Goals and Risk Factors at Admission:     Personal Goals and Risk Factors at Admission - 11/23/15 1641    Core Components/Risk Factors/Patient Goals on Admission    Weight Management Yes   Intervention Weight Management: Provide education and appropriate resources to help participant work on and attain dietary goals.   Admit Weight 173 lb 9.6 oz (78.744 kg)   Goal Weight: Short Term 168 lb 9.6 oz (76.476 kg)   Goal Weight: Long Term 165 lb (74.844 kg)   Expected Outcomes Short Term: Continue to assess and modify interventions until short term weight is achieved.;Long Term: Adherence to nutrition and physical activity/exercise program aimed toward attainment of established weight goal.   Sedentary Yes   Intervention Develop an individualized exercise prescription for aerobic and resistive training based on initial evaluation findings, risk stratification, comorbidities and participant's personal goals.;Provide advice, education, support and counseling about physical activity/exercise needs.   Expected Outcomes Achievement of increased cardiorespiratory fitness and enhanced flexibility, muscular endurance and strength shown through measurements of functional capaciy and personal statement of participant.   Tobacco Cessation --  Patient quit 06/01/2014   Improve shortness of breath with ADL's Yes   Intervention Provide education,  individualized exercise plan and daily activity instruction to help decrease symptoms of SOB with activities of daily living.   Expected Outcomes Short Term: Achieves a reduction of symptoms when performing activities of daily living.   Develop more efficient breathing techniques such as purse lipped breathing and diaphragmatic breathing; and practicing self-pacing with activity Yes   Intervention Provide education, demonstration and support about specific breathing techniuqes utilized for more efficient breathing. Include techniques such as pursed lipped breathing, diaphragmatic breathing and self-pacing activity.   Expected Outcomes Short Term: Participant will be able to demonstrate and use breathing techniques as needed throughout daily activities.   Diabetes --  Not diabetic   Hypertension Yes   Expected Outcomes Short Term: Continued assessment and intervention until BP is < 140/67mm HG in hypertensive participants. < 130/68mm HG in hypertensive participants with diabetes, heart failure or chronic kidney disease.;Long Term: Maintenance of blood pressure at goal levels.   Lipids --  Levels unknow at aorientation visit   Stress Yes   Intervention Offer individual and/or small group education and counseling on adjustment to heart disease, stress management and health-related lifestyle change. Teach and support self-help  strategies.   Expected Outcomes Long Term: Emotional wellbeing is indicated by absence of clinically significant psychosocial distress or social isolation.   Personal Goal Other Yes   Personal Goal Get strength back in arms and legs, be able to fix my own hair, use O2 at night only, be able to go shopping w/o O2   Intervention Continue to exercise with regularity, loss about 10lbs, embrace breathing techniques   Expected Outcomes Become stronger and not be so SOB and decrease use of oxygen      Personal Goals and Risk Factors Review:      Goals and Risk Factor Review       11/23/15 1651           Core Components/Risk Factors/Patient Goals Review   Personal Goals Review Sedentary;Develop more efficient breathing techniques such as purse lipped breathing and diaphragmatic breathing and practicing self-pacing with activity.;Stress;Improve shortness of breath with ADL's       Review Reviewed goals and discussed what we are planning to reach these goals.        Expected Outcomes Improve sedentary lifestyle with exercise regularity, lose weight, use breathing techniques          Personal Goals Discharge (Final Personal Goals and Risk Factors Review):      Goals and Risk Factor Review - 11/23/15 1651    Core Components/Risk Factors/Patient Goals Review   Personal Goals Review Sedentary;Develop more efficient breathing techniques such as purse lipped breathing and diaphragmatic breathing and practicing self-pacing with activity.;Stress;Improve shortness of breath with ADL's   Review Reviewed goals and discussed what we are planning to reach these goals.    Expected Outcomes Improve sedentary lifestyle with exercise regularity, lose weight, use breathing techniques      ITP Comments:   Comments: Patient arrived for 1st visit/orientation/education at 1321. Patient was referred to [facility] by Dr. Juanetta Gosling due to SOB (R06.02). During orientation advised patient on arrival and appointment times what to wear, what to do before, during and after exercise. Reviewed attendance and class policy. Talked about inclement weather and class consultation policy. Pt is scheduled to return Pulm Rehab on 11/29/15 at 1330. Pt was advised to come to class 15 minutes before class starts. She was also given instructions on meeting with the dietician and attending the Family Structure classes. Pt is eager to get started. Patient was able to complete 6 minute walk test with assistance of wheelchair and 2L oxygen. Patient was measured for the equipment. Discussed equipment safety with patient.  Patient did not have copy of PFT in Epic at time of orientation. She says she has a copy at home and will bring it on her next visit.  Took patient pre-anthropometric measurements. Patient finished visit at 1665.

## 2015-11-23 NOTE — Progress Notes (Signed)
Cardiac/Pulmonary Rehab Medication Review by a Pharmacist  Does the patient  feel that his/her medications are working for him/her?  yes  Has the patient been experiencing any side effects to the medications prescribed?  no  Does the patient measure his/her own blood pressure or blood glucose at home?  yes   Does the patient have any problems obtaining medications due to transportation or finances?   no  Understanding of regimen: excellent Understanding of indications: excellent Potential of compliance: excellent  Questions asked to Determine Patient Understanding of Medication Regimen:  1. What is the name of the medication?  2. What is the medication used for?  3. When should it be taken?  4. How much should be taken?  5. How will you take it?  6. What side effects should you report?  Understanding Defined as: Excellent: All questions above are correct Good: Questions 1-4 are correct Fair: Questions 1-2 are correct  Poor: 1 or none of the above questions are correct   Pharmacist comments: Pt is taking Xarelto and is getting some financial assistance but that may expire soon.  Otherwise pt not having any problems with medications. No side effects reported.  Pt does check BP at home and keeps a journal.    Tracey Dudley, Tracey Dudley A 11/23/2015 3:14 PM

## 2015-11-29 ENCOUNTER — Other Ambulatory Visit: Payer: Self-pay

## 2015-11-29 ENCOUNTER — Encounter (HOSPITAL_COMMUNITY): Payer: Self-pay | Admitting: *Deleted

## 2015-11-29 ENCOUNTER — Encounter (HOSPITAL_COMMUNITY): Payer: PRIVATE HEALTH INSURANCE

## 2015-11-29 ENCOUNTER — Emergency Department (HOSPITAL_COMMUNITY): Payer: PRIVATE HEALTH INSURANCE

## 2015-11-29 ENCOUNTER — Emergency Department (HOSPITAL_COMMUNITY)
Admission: EM | Admit: 2015-11-29 | Discharge: 2015-11-29 | Disposition: A | Discharge status: Home / Self Care | Payer: PRIVATE HEALTH INSURANCE | Attending: Emergency Medicine | Admitting: Emergency Medicine

## 2015-11-29 DIAGNOSIS — Z79899 Other long term (current) drug therapy: Secondary | ICD-10-CM | POA: Diagnosis not present

## 2015-11-29 DIAGNOSIS — I480 Paroxysmal atrial fibrillation: Secondary | ICD-10-CM | POA: Insufficient documentation

## 2015-11-29 DIAGNOSIS — J449 Chronic obstructive pulmonary disease, unspecified: Secondary | ICD-10-CM | POA: Diagnosis not present

## 2015-11-29 DIAGNOSIS — E785 Hyperlipidemia, unspecified: Secondary | ICD-10-CM | POA: Insufficient documentation

## 2015-11-29 DIAGNOSIS — Z87891 Personal history of nicotine dependence: Secondary | ICD-10-CM | POA: Insufficient documentation

## 2015-11-29 DIAGNOSIS — R0602 Shortness of breath: Secondary | ICD-10-CM | POA: Diagnosis present

## 2015-11-29 DIAGNOSIS — R5383 Other fatigue: Secondary | ICD-10-CM | POA: Diagnosis not present

## 2015-11-29 DIAGNOSIS — I10 Essential (primary) hypertension: Secondary | ICD-10-CM | POA: Diagnosis not present

## 2015-11-29 LAB — CBC WITH DIFFERENTIAL/PLATELET
BASOS PCT: 0 %
Basophils Absolute: 0 10*3/uL (ref 0.0–0.1)
Eosinophils Absolute: 0.1 10*3/uL (ref 0.0–0.7)
Eosinophils Relative: 1 %
HEMATOCRIT: 33.8 % — AB (ref 36.0–46.0)
HEMOGLOBIN: 10.7 g/dL — AB (ref 12.0–15.0)
LYMPHS PCT: 12 %
Lymphs Abs: 0.8 10*3/uL (ref 0.7–4.0)
MCH: 31.7 pg (ref 26.0–34.0)
MCHC: 31.7 g/dL (ref 30.0–36.0)
MCV: 100 fL (ref 78.0–100.0)
MONO ABS: 0.6 10*3/uL (ref 0.1–1.0)
MONOS PCT: 8 %
NEUTROS ABS: 5.2 10*3/uL (ref 1.7–7.7)
NEUTROS PCT: 79 %
Platelets: 125 10*3/uL — ABNORMAL LOW (ref 150–400)
RBC: 3.38 MIL/uL — ABNORMAL LOW (ref 3.87–5.11)
RDW: 14 % (ref 11.5–15.5)
WBC: 6.6 10*3/uL (ref 4.0–10.5)

## 2015-11-29 LAB — BASIC METABOLIC PANEL
ANION GAP: 6 (ref 5–15)
BUN: 12 mg/dL (ref 6–20)
CHLORIDE: 99 mmol/L — AB (ref 101–111)
CO2: 37 mmol/L — AB (ref 22–32)
Calcium: 8.7 mg/dL — ABNORMAL LOW (ref 8.9–10.3)
Creatinine, Ser: 0.95 mg/dL (ref 0.44–1.00)
GFR calc non Af Amer: >60 mL/min (ref >60)
GLUCOSE: 77 mg/dL (ref 65–99)
POTASSIUM: 3.9 mmol/L (ref 3.5–5.1)
Sodium: 142 mmol/L (ref 135–145)

## 2015-11-29 LAB — URINALYSIS, ROUTINE W REFLEX MICROSCOPIC
Bilirubin Urine: NEGATIVE
GLUCOSE, UA: NEGATIVE mg/dL
Hgb urine dipstick: NEGATIVE
KETONES UR: NEGATIVE mg/dL
LEUKOCYTES UA: NEGATIVE
NITRITE: NEGATIVE
PH: 6.5 (ref 5.0–8.0)
Protein, ur: NEGATIVE mg/dL

## 2015-11-29 MED ORDER — IPRATROPIUM-ALBUTEROL 0.5-2.5 (3) MG/3ML IN SOLN
3.0000 mL | Freq: Once | RESPIRATORY_TRACT | Status: AC
Start: 2015-11-29 — End: 2015-11-29
  Administered 2015-11-29: 3 mL via RESPIRATORY_TRACT

## 2015-11-29 MED ORDER — ALBUTEROL SULFATE (2.5 MG/3ML) 0.083% IN NEBU
2.5000 mg | INHALATION_SOLUTION | Freq: Once | RESPIRATORY_TRACT | Status: AC
  Administered 2015-11-29: 2.5 mg via RESPIRATORY_TRACT

## 2015-11-29 MED ORDER — SODIUM CHLORIDE 0.9 % IV BOLUS (SEPSIS)
500.0000 mL | Freq: Once | INTRAVENOUS | Status: AC
  Administered 2015-11-29: 500 mL via INTRAVENOUS

## 2015-11-29 MED ORDER — SODIUM CHLORIDE 0.9 % IV SOLN
INTRAVENOUS | Status: DC
  Administered 2015-11-29: 18:00:00 via INTRAVENOUS

## 2015-11-29 MED ORDER — ALBUTEROL SULFATE (2.5 MG/3ML) 0.083% IN NEBU: 5.0000 mg | INHALATION_SOLUTION | Freq: Once | RESPIRATORY_TRACT | Status: DC

## 2015-11-29 MED ORDER — ALBUTEROL SULFATE (2.5 MG/3ML) 0.083% IN NEBU
INHALATION_SOLUTION | RESPIRATORY_TRACT | Status: AC
  Administered 2015-11-29: 2.5 mg via RESPIRATORY_TRACT
  Filled 2015-11-29: qty 3

## 2015-11-29 MED ORDER — IPRATROPIUM-ALBUTEROL 0.5-2.5 (3) MG/3ML IN SOLN
RESPIRATORY_TRACT | Status: AC
  Administered 2015-11-29: 3 mL via RESPIRATORY_TRACT
  Filled 2015-11-29: qty 3

## 2015-11-29 MED ORDER — ACETAMINOPHEN 325 MG PO TABS
650.0000 mg | ORAL_TABLET | Freq: Once | ORAL | Status: AC
  Administered 2015-11-29: 650 mg via ORAL
  Filled 2015-11-29: qty 2

## 2015-11-29 MED ORDER — IPRATROPIUM BROMIDE 0.02 % IN SOLN: 0.5000 mg | Freq: Once | RESPIRATORY_TRACT | Status: DC

## 2015-11-29 NOTE — ED Notes (Signed)
Pt EKG cleared by Estell HarpinZammit and Effie ShyWentz.

## 2015-11-29 NOTE — ED Notes (Signed)
Pt comes in with shortness of breath starting Sunday with worsening yesterday. Pt states she has a generalized feeling of weakness, no specific pain. Pt unable to complete sentences without taking a breath. Pt is on home oxygen at 2L.

## 2015-11-29 NOTE — Discharge Instructions (Signed)
Go to your next pulmonary rehabilitation appointment, as scheduled. Get plenty of rest, and drink a lot of fluids.   Fatigue Fatigue is feeling tired all of the time, a lack of energy, or a lack of motivation. Occasional or mild fatigue is often a normal response to activity or life in general. However, long-lasting (chronic) or extreme fatigue may indicate an underlying medical condition. HOME CARE INSTRUCTIONS  Watch your fatigue for any changes. The following actions may help to lessen any discomfort you are feeling:  Talk to your health care provider about how much sleep you need each night. Try to get the required amount every night.  Take medicines only as directed by your health care provider.  Eat a healthy and nutritious diet. Ask your health care provider if you need help changing your diet.  Drink enough fluid to keep your urine clear or pale yellow.  Practice ways of relaxing, such as yoga, meditation, massage therapy, or acupuncture.  Exercise regularly.   Change situations that cause you stress. Try to keep your work and personal routine reasonable.  Do not abuse illegal drugs.  Limit alcohol intake to no more than 1 drink per day for nonpregnant women and 2 drinks per day for men. One drink equals 12 ounces of beer, 5 ounces of wine, or 1 ounces of hard liquor.  Take a multivitamin, if directed by your health care provider. SEEK MEDICAL CARE IF:   Your fatigue does not get better.  You have a fever.   You have unintentional weight loss or gain.  You have headaches.   You have difficulty:   Falling asleep.  Sleeping throughout the night.  You feel angry, guilty, anxious, or sad.   You are unable to have a bowel movement (constipation).   You skin is dry.   Your legs or another part of your body is swollen.  SEEK IMMEDIATE MEDICAL CARE IF:   You feel confused.   Your vision is blurry.  You feel faint or pass out.   You have a severe  headache.   You have severe abdominal, pelvic, or back pain.   You have chest pain, shortness of breath, or an irregular or fast heartbeat.   You are unable to urinate or you urinate less than normal.   You develop abnormal bleeding, such as bleeding from the rectum, vagina, nose, lungs, or nipples.  You vomit blood.   You have thoughts about harming yourself or committing suicide.   You are worried that you might harm someone else.    This information is not intended to replace advice given to you by your health care provider. Make sure you discuss any questions you have with your health care provider.   Document Released: 07/01/2007 Document Revised: 09/24/2014 Document Reviewed: 01/05/2014 [employer] Interactive Patient Education [employer]2016 [employer]

## 2015-11-29 NOTE — ED Provider Notes (Signed)
CSN: [id number]     Arrival date & time 11/29/15  1255 History   First MD Initiated Contact with Patient 11/29/15 1509     Chief Complaint  Patient presents with  . Shortness of Breath     (Consider location/radiation/quality/duration/timing/severity/associated sxs/prior Treatment) HPI   Tracey Dudley is a 70 y.o. female who is here today with concern of shortness of breath, which is worsening, since yesterday. She feels generally weak. She is using her home oxygen, as usual. She was due to go to pulmonary rehabilitation today, but decided to come here for evaluation instead. She was hospitalized about 3 weeks ago and treated for pneumonia. She has multiple medications which she takes for asthma, but has not had to use her albuterol inhaler. She denies fever, chills, new cough, sputum production, nausea, vomiting or chest pain. She is taking all of her medications as directed. There are no other known modifying factors.   Past Medical History  Diagnosis Date  . Essential hypertension   . COPD (chronic obstructive pulmonary disease) (HCC)   . Herpes zoster   . Vitamin D deficiency   . Hyperlipidemia   . Anxiety   . PAF (paroxysmal atrial fibrillation) (HCC)     Followed by Dr. Leonie Green in [facility], also [facility] EP assessment by Dr. Christin Fudge   Past Surgical History  Procedure Laterality Date  . Abdominal hysterectomy    . Cataract extraction     Family History  Problem Relation Age of Onset  . Breast cancer Sister   . CAD Father   . Asthma Mother    Social History  Substance Use Topics  . Smoking status: Former Smoker -- 0.50 packs/day for 30 years    Types: Cigarettes    Start date: 11/03/1977    Quit date: 06/01/2014  . Smokeless tobacco: Never Used  . Alcohol Use: No   OB History    Gravida Para Term Preterm AB TAB SAB Ectopic Multiple Living            1     Review of Systems  All other systems reviewed and are negative.     Allergies  Metoprolol and  Erythromycin  Home Medications   Prior to Admission medications   Medication Sig Start Date End Date Taking? Authorizing Provider  acetaminophen (TYLENOL) 500 MG tablet Take 1,000 mg by mouth every 6 (six) hours as needed for moderate pain.   Yes Historical Provider, MD  albuterol (PROVENTIL HFA;VENTOLIN HFA) 108 (90 BASE) MCG/ACT inhaler Inhale 1 puff into the lungs every 6 (six) hours as needed for wheezing or shortness of breath.   Yes Historical Provider, MD  ALPRAZolam Prudy Feeler) 0.5 MG tablet Take 0.5 mg by mouth 3 (three) times daily as needed for anxiety.   Yes Historical Provider, MD  atorvastatin (LIPITOR) 20 MG tablet Take 20 mg by mouth daily.   Yes Historical Provider, MD  Azilsartan Medoxomil (EDARBI) 80 MG TABS Take 1 tablet by mouth daily.   Yes Historical Provider, MD  dofetilide (TIKOSYN) 250 MCG capsule Take 250 mcg by mouth 2 (two) times daily.   Yes Historical Provider, MD  fluticasone furoate-vilanterol (BREO ELLIPTA) 200-25 MCG/INH AEPB Inhale 1 puff into the lungs daily.   Yes Historical Provider, MD  hydrALAZINE (APRESOLINE) 100 MG tablet Take 100 mg by mouth 2 (two) times daily.   Yes Historical Provider, MD  INCRUSE ELLIPTA 62.5 MCG/INH AEPB INHALE 1 PUFF BY MOUTH EVERY DAY 10/20/15  Yes Historical Provider, MD  Nebivolol  HCl (BYSTOLIC) 20 MG TABS Take 1 tablet by mouth daily.   Yes Historical Provider, MD  rivaroxaban (XARELTO) 20 MG TABS tablet Take 20 mg by mouth daily with supper.   Yes Historical Provider, MD   BP 177/67 mmHg  Pulse 60  Temp(Src) 97.9 F (36.6 C) (Oral)  Resp 23  Ht  (1.676 m)  Wt 165 lb (74.844 kg)  BMI 26.64 kg/m2  SpO2 94% Physical Exam  Constitutional: She is oriented to person, place, and time. She appears well-developed.  Elderly, frail  HENT:  Head: Normocephalic and atraumatic.  Right Ear: External ear normal.  Left Ear: External ear normal.  Eyes: Conjunctivae and EOM are normal. Pupils are equal, round, and reactive to  light.  Neck: Normal range of motion and phonation normal. Neck supple.  Cardiovascular: Normal rate, regular rhythm and normal heart sounds.   Pulmonary/Chest: Effort normal and breath sounds normal. No respiratory distress. She has no wheezes. She exhibits no bony tenderness.  Abdominal: Soft. There is no tenderness.  Musculoskeletal: Normal range of motion. She exhibits no edema or tenderness.  Neurological: She is alert and oriented to person, place, and time. No cranial nerve deficit or sensory deficit. She exhibits normal muscle tone. Coordination normal.  Skin: Skin is warm, dry and intact.  Psychiatric: She has a normal mood and affect. Her behavior is normal. Judgment and thought content normal.  Nursing note and vitals reviewed.   ED Course  Procedures (including critical care time) Medications  0.9 %  sodium chloride infusion ( Intravenous Stopped 11/29/15 1917)  sodium chloride 0.9 % bolus 500 mL (0 mLs Intravenous Stopped 11/29/15 1732)  ipratropium-albuterol (DUONEB) 0.5-2.5 (3) MG/3ML nebulizer solution 3 mL (3 mLs Nebulization Given 11/29/15 1605)  albuterol (PROVENTIL) (2.5 MG/3ML) 0.083% nebulizer solution 2.5 mg (2.5 mg Nebulization Given 11/29/15 1606)  acetaminophen (TYLENOL) tablet 650 mg (650 mg Oral Given 11/29/15 1833)    Patient Vitals for the past 24 hrs:  BP Temp Temp src Pulse Resp SpO2 Height Weight  11/29/15 1900 177/67 mmHg - - 60 23 94 % - -  11/29/15 1834 177/66 mmHg - - 62 22 96 % - -  11/29/15 1830 177/66 mmHg - - (!) 59 24 95 % - -  11/29/15 1800 175/63 mmHg - - (!) 58 23 97 % - -  11/29/15 1730 178/67 mmHg - - 71 21 96 % - -  11/29/15 1700 176/65 mmHg - - (!) 59 24 97 % - -  11/29/15 1630 178/67 mmHg - - 61 22 96 % - -  11/29/15 1606 - - - - - 97 % - -  11/29/15 1600 (!) 154/54 mmHg - - (!) 55 14 97 % - -  11/29/15 1535 176/70 mmHg - - (!) 56 24 98 % - -  11/29/15 1530 176/70 mmHg - - (!) 56 25 97 % - -  11/29/15 1500 173/63 mmHg - - (!) 54 21 98 %  - -  11/29/15 1438 188/67 mmHg - - (!) 58 18 97 % - -  11/29/15 1324 195/78 mmHg 97.9 F (36.6 C) Oral (!) 54 18 98 %  (1.676 m) 165 lb (74.844 kg)    6:00 PM Reevaluation with update and discussion. After initial assessment and treatment, an updated evaluation reveals no change in clinical status. Findings discussed with patient and husband, all questions were answered. Kieth Hartis L     Labs Review Labs Reviewed  BASIC METABOLIC PANEL - Abnormal; Notable  for the following:    Chloride 99 (*)    CO2 37 (*)    Calcium 8.7 (*)    All other components within normal limits  CBC WITH DIFFERENTIAL/PLATELET - Abnormal; Notable for the following:    RBC 3.38 (*)    Hemoglobin 10.7 (*)    HCT 33.8 (*)    Platelets 125 (*)    All other components within normal limits  URINALYSIS, ROUTINE W REFLEX MICROSCOPIC (NOT AT [facility]) - Abnormal; Notable for the following:    Specific Gravity, Urine <1.005 (*)    All other components within normal limits  URINE CULTURE    Imaging Review Dg Chest 2 View  11/29/2015  CLINICAL DATA:  Arm and leg weakness for 3 days EXAM: CHEST  2 VIEW COMPARISON:  October 08, 2015. FINDINGS: The heart size and mediastinal contours are within normal limits. Stable mild reticular densities are noted throughout both lungs most consistent scarring. No acute pulmonary disease is noted. No pneumothorax or pleural effusion is noted. The visualized skeletal structures are unremarkable. IMPRESSION: No active cardiopulmonary disease. Electronically Signed   By: Lupita Raider, M.D.   On: 11/29/2015 14:01   I have personally reviewed and evaluated these images and lab results as part of my medical decision-making.   EKG Interpretation   Date/Time:  Tuesday November 29 2015 13:21:03 EDT Ventricular Rate:  57 PR Interval:  132 QRS Duration: 82 QT Interval:  466 QTC Calculation: 453 R Axis:   101 Text Interpretation:  Sinus bradycardia Rightward axis Cannot rule out   Anterior infarct , age undetermined Abnormal ECG Since last tracing of  earlier today No significant change was found Confirmed by [facility]  MD,  Taima Rada [phone]) on 11/29/2015 9:48:17 PM        EKG Interpretation  Date/Time:  Tuesday November 29 2015 13:21:03 EDT Ventricular Rate:  57 PR Interval:  132 QRS Duration: 82 QT Interval:  466 QTC Calculation: 453 R Axis:   101 Text Interpretation:  Sinus bradycardia Rightward axis Cannot rule out Anterior infarct , age undetermined Abnormal ECG Since last tracing of earlier today No significant change was found Confirmed by [facility]  MD, Jayel Inks [phone]) on 11/29/2015 9:48:17 PM       EKG Interpretation  Date/Time:  Tuesday November 29 2015 13:21:03 EDT Ventricular Rate:  57 PR Interval:  132 QRS Duration: 82 QT Interval:  466 QTC Calculation: 453 R Axis:   101 Text Interpretation:  Sinus bradycardia Rightward axis Cannot rule out Anterior infarct , age undetermined Abnormal ECG Since last tracing of earlier today No significant change was found Confirmed by Effie Shy  MD, Lucero Ide [phone]) on 11/29/2015 9:48:17 PM          MDM   Final diagnoses:  Other fatigue    Nonspecific shortness of breath, with reassuring pulmonary evaluation. Symptomatic treatment given with IV fluids, bronchodilators and patient observed without worsening condition. Sepsis, most likely related to her known pulmonary decompensation following recent pneumonia and hospitalization. Doubt new pneumonia, PE, ACS or metabolic instability.  Nursing Notes Reviewed/ Care Coordinated Applicable Imaging Reviewed Interpretation of Laboratory Data incorporated into ED treatment  The patient appears reasonably screened and/or stabilized for discharge and I doubt any other medical condition or other [facility] requiring further screening, evaluation, or treatment in the ED at this time prior to discharge.  Plan: Home Medications- usual; Home Treatments- rest; return here if the recommended  treatment, does not improve the symptoms; Recommended follow up- PCP prn  Mancel BaleElliott Loetta Connelley, MD 11/29/15 2149

## 2015-12-01 ENCOUNTER — Encounter (HOSPITAL_COMMUNITY): Payer: PRIVATE HEALTH INSURANCE

## 2015-12-01 LAB — URINE CULTURE

## 2015-12-06 ENCOUNTER — Encounter (HOSPITAL_COMMUNITY)
Admission: RE | Admit: 2015-12-06 | Discharge: 2015-12-06 | Disposition: A | Discharge status: Home / Self Care | Payer: PRIVATE HEALTH INSURANCE | Source: Ambulatory Visit | Attending: Pulmonary Disease | Admitting: Pulmonary Disease

## 2015-12-06 DIAGNOSIS — J449 Chronic obstructive pulmonary disease, unspecified: Secondary | ICD-10-CM | POA: Diagnosis not present

## 2015-12-08 ENCOUNTER — Encounter (HOSPITAL_COMMUNITY)
Admission: RE | Admit: 2015-12-08 | Discharge: 2015-12-08 | Disposition: A | Discharge status: Home / Self Care | Payer: PRIVATE HEALTH INSURANCE | Source: Ambulatory Visit | Attending: Pulmonary Disease | Admitting: Pulmonary Disease

## 2015-12-08 DIAGNOSIS — J449 Chronic obstructive pulmonary disease, unspecified: Secondary | ICD-10-CM | POA: Diagnosis not present

## 2015-12-12 NOTE — Progress Notes (Signed)
Pulmonary Individual Treatment Plan  Patient Details  Name: Tracey KindredLinda E Dudley MRN: [id number] Date of Birth: 1946/09/09 Referring Provider:  Kari BaarsHawkins, Edward, MD  Initial Encounter Date:       PULMONARY REHAB OTHER RESP OR[facility]ATION [facility] [place]   Date  11/23/15      Visit Diagnosis: No diagnosis found.  Patient's Home Medications on Admission:   Current outpatient prescriptions:  .  acetaminophen (TYLENOL) 500 MG tablet, Take 1,000 mg by mouth every 6 (six) hours as needed for moderate pain., Disp: , Rfl:  .  albuterol (PROVENTIL HFA;VENTOLIN HFA) 108 (90 BASE) MCG/ACT inhaler, Inhale 1 puff into the lungs every 6 (six) hours as needed for wheezing or shortness of breath., Disp: , Rfl:  .  ALPRAZolam (XANAX) 0.5 MG tablet, Take 0.5 mg by mouth 3 (three) times daily as needed for anxiety., Disp: , Rfl:  .  atorvastatin (LIPITOR) 20 MG tablet, Take 20 mg by mouth daily., Disp: , Rfl:  .  Azilsartan Medoxomil (EDARBI) 80 MG TABS, Take 1 tablet by mouth daily., Disp: , Rfl:  .  dofetilide (TIKOSYN) 250 MCG capsule, Take 250 mcg by mouth 2 (two) times daily., Disp: , Rfl:  .  fluticasone furoate-vilanterol (BREO ELLIPTA) 200-25 MCG/INH AEPB, Inhale 1 puff into the lungs daily., Disp: , Rfl:  .  hydrALAZINE (APRESOLINE) 100 MG tablet, Take 100 mg by mouth 2 (two) times daily., Disp: , Rfl:  .  INCRUSE ELLIPTA 62.5 MCG/INH AEPB, INHALE 1 PUFF BY MOUTH EVERY DAY, Disp: , Rfl: 0 .  Nebivolol HCl (BYSTOLIC) 20 MG TABS, Take 1 tablet by mouth daily., Disp: , Rfl:  .  rivaroxaban (XARELTO) 20 MG TABS tablet, Take 20 mg by mouth daily with supper., Disp: , Rfl:   Past Medical History: Past Medical History  Diagnosis Date  . Essential hypertension   . COPD (chronic obstructive pulmonary disease) (HCC)   . Herpes zoster   . Vitamin D deficiency   . Hyperlipidemia   . Anxiety   . PAF (paroxysmal atrial fibrillation) (HCC)     [place],  also [facility] EP assessment by Dr. Christin FudgeHegland    Tobacco Use: History  Smoking status  . Former Smoker -- 0.50 packs/day for 30 years  . Types: Cigarettes  . Start date: 11/03/1977  . Quit date: 06/01/2014  Smokeless tobacco  . Never Used    Labs:     Recent Review Flowsheet Data    There is no flowsheet data to display.      Capillary Blood Glucose: No results found for: GLUCAP   ADL UCSD:     Pulmonary Assessment Scores      11/23/15 1440       ADL UCSD   ADL Phase Entry     SOB Score total 77     Rest 0     Walk 2     Stairs 5     Bath 3     Dress 3     Shop 3        Pulmonary Function Assessment:     Pulmonary Function Assessment - 11/23/15 1639    Breath   Bilateral Breath Sounds Clear   Shortness of Breath Yes      Exercise Target Goals:    Exercise Program Goal: Individual exercise prescription set with THRR, safety & activity barriers. Participant demonstrates ability to understand and report RPE using BORG scale, to self-measure pulse accurately, and  to acknowledge the importance of the exercise prescription.  Exercise Prescription Goal: Starting with aerobic activity 30 plus minutes a day, 3 days per week for initial exercise prescription. Provide home exercise prescription and guidelines that participant acknowledges understanding prior to discharge.  Activity Barriers & Risk Stratification:     Activity Barriers & Cardiac Risk Stratification - 11/23/15 1637    Activity Barriers & Cardiac Risk Stratification   Activity Barriers None   Cardiac Risk Stratification Low      6 Minute Walk:     6 Minute Walk      11/23/15 1442       6 Minute Walk   Phase Initial     Distance 850 feet     Walk Time 6 minutes     # of Rest Breaks 0     MPH 1.61     METS 2.23     RPE 13     Perceived Dyspnea  11     VO2 Peak 8.87     Symptoms No     Resting HR 58 bpm     Resting BP 152/80 mmHg     Max Ex. HR 91 bpm     Max Ex. BP 198/92 mmHg      2 Minute Post BP 168/88 mmHg     Interval Oxygen   Interval Oxygen? --  Patient used 2L [place] O2 Contiuous during test     Pre/Post BP   Baseline BP 152/80 mmHg     6 Minute BP 198/92 mmHg     Pre/Post BP? Yes        Initial Exercise Prescription:     Initial Exercise Prescription - 11/23/15 1455    Date of Initial Exercise Prescription   Date 11/23/15   Oxygen   Oxygen Continuous   Liters 2   Treadmill   MPH 1   Grade 0   Minutes 15   METs 1.77   NuStep   Level 2   Minutes 15   METs 1.5   Arm Ergometer   Level 1   Minutes 15   METs 1   Prescription Details   Frequency (times per week) 2   Intensity   THRR REST +  20   THRR 40-80% of Max Heartrate 96-115   Ratings of Perceived Exertion 11-13   Perceived Dyspnea 2-4   Progression   Progression Continue to progress workloads to maintain intensity without signs/symptoms of physical distress.   Resistance Training   Training Prescription Yes   Weight 1.0   Reps 10-12      Perform Capillary Blood Glucose checks as needed.  Exercise Prescription Changes:      Exercise Prescription Changes      12/12/15 1300           Exercise Review   Progression Yes       Response to Exercise   Blood Pressure (Admit) 118/74 mmHg       Blood Pressure (Exercise) 142/86 mmHg       Blood Pressure (Exit) 132/72 mmHg       Heart Rate (Admit) 74 bpm       Heart Rate (Exercise) 73 bpm       Heart Rate (Exit) 60 bpm       Oxygen Saturation (Admit) 94 %       Oxygen Saturation (Exercise) 98 %       Oxygen Saturation (Exit) 92 %       Rating of  Perceived Exertion (Exercise) 12       Perceived Dyspnea (Exercise) 12       Symptoms No       Progression   Progression Continue to progress workloads to maintain intensity without signs/symptoms of physical distress.       Resistance Training   Training Prescription Yes       Weight 1.0       Reps 10-12       Oxygen   Oxygen Continuous       Liters 2       Treadmill   MPH  1       Grade 0       Minutes 15       METs 1.77       NuStep   Level 2       Minutes 15       METs 3.51       Arm Ergometer   Level 1.5       Minutes 15       METs 1.5          Exercise Comments:   Discharge Exercise Prescription (Final Exercise Prescription Changes):     Exercise Prescription Changes - 12/12/15 1300    Exercise Review   Progression Yes   Response to Exercise   Blood Pressure (Admit) 118/74 mmHg   Blood Pressure (Exercise) 142/86 mmHg   Blood Pressure (Exit) 132/72 mmHg   Heart Rate (Admit) 74 bpm   Heart Rate (Exercise) 73 bpm   Heart Rate (Exit) 60 bpm   Oxygen Saturation (Admit) 94 %   Oxygen Saturation (Exercise) 98 %   Oxygen Saturation (Exit) 92 %   Rating of Perceived Exertion (Exercise) 12   Perceived Dyspnea (Exercise) 12   Symptoms No   Progression   Progression Continue to progress workloads to maintain intensity without signs/symptoms of physical distress.   Resistance Training   Training Prescription Yes   Weight 1.0   Reps 10-12   Oxygen   Oxygen Continuous   Liters 2   Treadmill   MPH 1   Grade 0   Minutes 15   METs 1.77   NuStep   Level 2   Minutes 15   METs 3.51   Arm Ergometer   Level 1.5   Minutes 15   METs 1.5      Nutrition:  Target Goals: Understanding of nutrition guidelines, daily intake of sodium 1500mg , cholesterol 200mg , calories 30% from fat and 7% or less from saturated fats, daily to have 5 or more servings of fruits and vegetables.  Biometrics:     Pre Biometrics - 11/23/15 1430    Pre Biometrics   Height  (1.651 m)   Weight 173 lb 9.6 oz (78.744 kg)   Waist Circumference 35 inches   Hip Circumference 39 inches   Waist to Hip Ratio 0.9 %   BMI (Calculated) 28.9   Triceps Skinfold 24 mm   % Body Fat 38.5 %   Grip Strength 66 kg   Flexibility 12 in   Single Leg Stand 24 seconds       Nutrition Therapy Plan and Nutrition Goals:     Nutrition Therapy & Goals - 11/23/15 1603     Intervention Plan   Intervention Prescribe, educate and counsel regarding individualized specific dietary modifications aiming towards targeted core components such as weight, hypertension, lipid management, diabetes, heart failure and other comorbidities.;Nutrition handout(s) given to patient.  Nutrition Discharge: Rate Your Plate Scores:     Nutrition Assessments - 11/23/15 1721    Rate Your Plate Scores   Pre Score 55      Psychosocial: Target Goals: Acknowledge presence or absence of depression, maximize coping skills, provide positive support system. Participant is able to verbalize types and ability to use techniques and skills needed for reducing stress and depression.  Initial Review & Psychosocial Screening:     Initial Psych Review & Screening - 11/23/15 1701    Initial Review   Current issues with --  None   Family Dynamics   Good Support System? Yes   Barriers   Psychosocial barriers to participate in program There are no identifiable barriers or psychosocial needs.   Screening Interventions   Interventions Encouraged to exercise      Quality of Life Scores:     Quality of Life - 11/23/15 1435    Quality of Life Scores   Health/Function Pre 17.41 %   Socioeconomic Pre 24.36 %   Psych/Spiritual Pre 22.36 %   Family Pre 28.8 %   GLOBAL Pre 21.41 %      PHQ-9:     Recent Review Flowsheet Data    Depression screen [facility] 2/9 11/23/2015   Decreased Interest 1   Down, Depressed, Hopeless 1   PHQ - 2 Score 2   Altered sleeping 0   Tired, decreased energy 1   Change in appetite 1   Feeling bad or failure about yourself  0   Trouble concentrating 0   Moving slowly or fidgety/restless 0   Suicidal thoughts 0   PHQ-9 Score 4   Difficult doing work/chores Somewhat difficult      Psychosocial Evaluation and Intervention:     Psychosocial Evaluation - 11/23/15 1702    Psychosocial Evaluation & Interventions   Interventions Encouraged to exercise  with the program and follow exercise prescription   Continued Psychosocial Services Needed No      Psychosocial Re-Evaluation:     Psychosocial Re-Evaluation      12/12/15 1508           Psychosocial Re-Evaluation   Interventions Encouraged to attend Pulmonary Rehabilitation for the exercise       Continued Psychosocial Services Needed No          Education: Education Goals: Education classes will be provided on a weekly basis, covering required topics. Participant will state understanding/return demonstration of topics presented.  Learning Barriers/Preferences:     Learning Barriers/Preferences - 11/23/15 1638    Learning Barriers/Preferences   Learning Barriers None   Learning Preferences Verbal Instruction      Education Topics: How Lungs Work and Diseases: - Discuss the anatomy of the lungs and diseases that can affect the lungs, such as COPD.   Exercise: -Discuss the importance of exercise, FITT principles of exercise, normal and abnormal responses to exercise, and how to exercise safely.   Environmental Irritants: -Discuss types of environmental irritants and how to limit exposure to environmental irritants.   Meds/Inhalers and oxygen: - Discuss respiratory medications, definition of an inhaler and oxygen, and the proper way to use an inhaler and oxygen.   Energy Saving Techniques: - Discuss methods to conserve energy and decrease shortness of breath when performing activities of daily living.    Bronchial Hygiene / Breathing Techniques: - Discuss breathing mechanics, pursed-lip breathing technique,  proper posture, effective ways to clear airways, and other functional breathing techniques   Cleaning Equipment: - Provides group  verbal and written instruction about the health risks of elevated stress, cause of high stress, and healthy ways to reduce stress.   Nutrition I: Fats: - Discuss the types of cholesterol, what cholesterol does to the body,  and how cholesterol levels can be controlled.          PULMONARY REHAB OTHER RESPIRATORY from 12/08/2015 in [facility]   Date  12/08/15   Educator  Truddie Crumble   Instruction Review Code  2- meets goals/outcomes      Nutrition II: Labels: -Discuss the different components of food labels and how to read food labels.   Respiratory Infections: - Discuss the signs and symptoms of respiratory infections, ways to prevent respiratory infections, and the importance of seeking medical treatment when having a respiratory infection.   Stress I: Signs and Symptoms: - Discuss the causes of stress, how stress may lead to anxiety and depression, and ways to limit stress.   Stress II: Relaxation: -Discuss relaxation techniques to limit stress.   Oxygen for Home/Travel: - Discuss how to prepare for travel when on oxygen and proper ways to transport and store oxygen to ensure safety.   Knowledge Questionnaire Score:     Knowledge Questionnaire Score - 11/23/15 1638    Knowledge Questionnaire Score   Pre Score 9/14      Core Components/Risk Factors/Patient Goals at Admission:     Personal Goals and Risk Factors at Admission - 11/23/15 1641    Core Components/Risk Factors/Patient Goals on Admission    Weight Management Yes   Intervention Weight Management: Provide education and appropriate resources to help participant work on and attain dietary goals.   Admit Weight 173 lb 9.6 oz (78.744 kg)   Goal Weight: Short Term 168 lb 9.6 oz (76.476 kg)   Goal Weight: Long Term 165 lb (74.844 kg)   Expected Outcomes Short Term: Continue to assess and modify interventions until short term weight is achieved.;Long Term: Adherence to nutrition and physical activity/exercise program aimed toward attainment of established weight goal.   Sedentary Yes   Intervention Develop an individualized exercise prescription for aerobic and resistive training based on initial evaluation  findings, risk stratification, comorbidities and participant's personal goals.;Provide advice, education, support and counseling about physical activity/exercise needs.   Expected Outcomes Achievement of increased cardiorespiratory fitness and enhanced flexibility, muscular endurance and strength shown through measurements of functional capaciy and personal statement of participant.   Tobacco Cessation --  Patient quit 06/01/2014   Improve shortness of breath with ADL's Yes   Intervention Provide education, individualized exercise plan and daily activity instruction to help decrease symptoms of SOB with activities of daily living.   Expected Outcomes Short Term: Achieves a reduction of symptoms when performing activities of daily living.   Develop more efficient breathing techniques such as purse lipped breathing and diaphragmatic breathing; and practicing self-pacing with activity Yes   Intervention Provide education, demonstration and support about specific breathing techniuqes utilized for more efficient breathing. Include techniques such as pursed lipped breathing, diaphragmatic breathing and self-pacing activity.   Expected Outcomes Short Term: Participant will be able to demonstrate and use breathing techniques as needed throughout daily activities.   Diabetes --  Not diabetic   Hypertension Yes   Expected Outcomes Short Term: Continued assessment and intervention until BP is < 140/64mm HG in hypertensive participants. < 130/49mm HG in hypertensive participants with diabetes, heart failure or chronic kidney disease.;Long Term: Maintenance of blood pressure at goal levels.   Lipids --  Levels unknow at aorientation visit   Stress Yes   Intervention Offer individual and/or small group education and counseling on adjustment to heart disease, stress management and health-related lifestyle change. Teach and support self-help strategies.   Expected Outcomes Long Term: Emotional wellbeing is  indicated by absence of clinically significant psychosocial distress or social isolation.   Personal Goal Other Yes   Personal Goal Get strength back in arms and legs, be able to fix my own hair, use O2 at night only, be able to go shopping w/o O2   Intervention Continue to exercise with regularity, loss about 10lbs, embrace breathing techniques   Expected Outcomes Become stronger and not be so SOB and decrease use of oxygen      Core Components/Risk Factors/Patient Goals Review:      Goals and Risk Factor Review      11/23/15 1651 12/12/15 1507         Core Components/Risk Factors/Patient Goals Review   Personal Goals Review Sedentary;Develop more efficient breathing techniques such as purse lipped breathing and diaphragmatic breathing and practicing self-pacing with activity.;Stress;Improve shortness of breath with ADL's Sedentary;Develop more efficient breathing techniques such as purse lipped breathing and diaphragmatic breathing and practicing self-pacing with activity.;Stress;Improve shortness of breath with ADL's;Hypertension;Increase Strength and Stamina      Review Reviewed goals and discussed what we are planning to reach these goals.  Reviewed goals and discussed what we are planning to reach these goals.       Expected Outcomes Improve sedentary lifestyle with exercise regularity, lose weight, use breathing techniques Improve sedentary lifestyle with exercise regularity, lose weight, use breathing techniques         Core Components/Risk Factors/Patient Goals at Discharge (Final Review):      Goals and Risk Factor Review - 12/12/15 1507    Core Components/Risk Factors/Patient Goals Review   Personal Goals Review Sedentary;Develop more efficient breathing techniques such as purse lipped breathing and diaphragmatic breathing and practicing self-pacing with activity.;Stress;Improve shortness of breath with ADL's;Hypertension;Increase Strength and Stamina   Review Reviewed goals  and discussed what we are planning to reach these goals.    Expected Outcomes Improve sedentary lifestyle with exercise regularity, lose weight, use breathing techniques      ITP Comments:   Comments: Patients goals and exercise prescription reviewed.

## 2015-12-13 ENCOUNTER — Encounter (HOSPITAL_COMMUNITY)
Admission: RE | Admit: 2015-12-13 | Discharge: 2015-12-13 | Disposition: A | Discharge status: Home / Self Care | Payer: PRIVATE HEALTH INSURANCE | Source: Ambulatory Visit | Attending: Pulmonary Disease | Admitting: Pulmonary Disease

## 2015-12-13 DIAGNOSIS — J449 Chronic obstructive pulmonary disease, unspecified: Secondary | ICD-10-CM | POA: Diagnosis not present

## 2015-12-15 ENCOUNTER — Encounter (HOSPITAL_COMMUNITY)
Admission: RE | Admit: 2015-12-15 | Discharge: 2015-12-15 | Disposition: A | Discharge status: Home / Self Care | Payer: PRIVATE HEALTH INSURANCE | Source: Ambulatory Visit | Attending: Pulmonary Disease | Admitting: Pulmonary Disease

## 2015-12-15 DIAGNOSIS — J449 Chronic obstructive pulmonary disease, unspecified: Secondary | ICD-10-CM | POA: Diagnosis not present

## 2015-12-20 ENCOUNTER — Encounter (HOSPITAL_COMMUNITY)
Admission: RE | Admit: 2015-12-20 | Discharge: 2015-12-20 | Disposition: A | Discharge status: Home / Self Care | Payer: PRIVATE HEALTH INSURANCE | Source: Ambulatory Visit | Attending: Pulmonary Disease | Admitting: Pulmonary Disease

## 2015-12-20 DIAGNOSIS — R0602 Shortness of breath: Secondary | ICD-10-CM | POA: Diagnosis not present

## 2015-12-22 ENCOUNTER — Encounter (HOSPITAL_COMMUNITY)
Admission: RE | Admit: 2015-12-22 | Discharge: 2015-12-22 | Disposition: A | Discharge status: Home / Self Care | Payer: PRIVATE HEALTH INSURANCE | Source: Ambulatory Visit | Attending: Pulmonary Disease | Admitting: Pulmonary Disease

## 2015-12-22 DIAGNOSIS — R0602 Shortness of breath: Secondary | ICD-10-CM | POA: Diagnosis not present

## 2015-12-27 ENCOUNTER — Encounter (HOSPITAL_COMMUNITY)
Admission: RE | Admit: 2015-12-27 | Discharge: 2015-12-27 | Disposition: A | Discharge status: Home / Self Care | Payer: PRIVATE HEALTH INSURANCE | Source: Ambulatory Visit | Attending: Pulmonary Disease | Admitting: Pulmonary Disease

## 2015-12-27 DIAGNOSIS — R0602 Shortness of breath: Secondary | ICD-10-CM | POA: Diagnosis not present

## 2015-12-28 ENCOUNTER — Other Ambulatory Visit: Payer: Self-pay | Admitting: Cardiology

## 2015-12-29 ENCOUNTER — Encounter (HOSPITAL_COMMUNITY)
Admission: RE | Admit: 2015-12-29 | Discharge: 2015-12-29 | Disposition: A | Discharge status: Home / Self Care | Payer: PRIVATE HEALTH INSURANCE | Source: Ambulatory Visit | Attending: Pulmonary Disease | Admitting: Pulmonary Disease

## 2015-12-29 DIAGNOSIS — R0602 Shortness of breath: Secondary | ICD-10-CM | POA: Diagnosis not present

## 2016-01-03 ENCOUNTER — Encounter (HOSPITAL_COMMUNITY)
Admission: RE | Admit: 2016-01-03 | Discharge: 2016-01-03 | Disposition: A | Discharge status: Home / Self Care | Payer: PRIVATE HEALTH INSURANCE | Source: Ambulatory Visit | Attending: Pulmonary Disease | Admitting: Pulmonary Disease

## 2016-01-03 DIAGNOSIS — R0602 Shortness of breath: Secondary | ICD-10-CM | POA: Diagnosis not present

## 2016-01-05 ENCOUNTER — Encounter (HOSPITAL_COMMUNITY)
Admission: RE | Admit: 2016-01-05 | Discharge: 2016-01-05 | Disposition: A | Discharge status: Home / Self Care | Payer: PRIVATE HEALTH INSURANCE | Source: Ambulatory Visit | Attending: Pulmonary Disease | Admitting: Pulmonary Disease

## 2016-01-05 DIAGNOSIS — R0602 Shortness of breath: Secondary | ICD-10-CM | POA: Diagnosis not present

## 2016-01-10 ENCOUNTER — Encounter (HOSPITAL_COMMUNITY)
Admission: RE | Admit: 2016-01-10 | Discharge: 2016-01-10 | Disposition: A | Discharge status: Home / Self Care | Payer: PRIVATE HEALTH INSURANCE | Source: Ambulatory Visit | Attending: Pulmonary Disease | Admitting: Pulmonary Disease

## 2016-01-10 DIAGNOSIS — R0602 Shortness of breath: Secondary | ICD-10-CM | POA: Diagnosis not present

## 2016-01-12 ENCOUNTER — Encounter (HOSPITAL_COMMUNITY)
Admission: RE | Admit: 2016-01-12 | Discharge: 2016-01-12 | Disposition: A | Discharge status: Home / Self Care | Payer: PRIVATE HEALTH INSURANCE | Source: Ambulatory Visit | Attending: Pulmonary Disease | Admitting: Pulmonary Disease

## 2016-01-12 DIAGNOSIS — R0602 Shortness of breath: Secondary | ICD-10-CM | POA: Diagnosis not present

## 2016-01-14 NOTE — Progress Notes (Signed)
Pulmonary Individual Treatment Plan  Patient Details  Name: Tracey Dudley MRN: [id number] Date of Birth: 1946/04/17 Referring Provider:    Initial Encounter Date:       PULMONARY REHAB OTHER RESP ORIENTATION from 11/23/2015 in [facility]   Date  11/23/15      Visit Diagnosis: No diagnosis found.  Patient's Home Medications on Admission:   Current outpatient prescriptions:  .  acetaminophen (TYLENOL) 500 MG tablet, Take 1,000 mg by mouth every 6 (six) hours as needed for moderate pain., Disp: , Rfl:  .  albuterol (PROVENTIL HFA;VENTOLIN HFA) 108 (90 BASE) MCG/ACT inhaler, Inhale 1 puff into the lungs every 6 (six) hours as needed for wheezing or shortness of breath., Disp: , Rfl:  .  ALPRAZolam (XANAX) 0.5 MG tablet, Take 0.5 mg by mouth 3 (three) times daily as needed for anxiety., Disp: , Rfl:  .  atorvastatin (LIPITOR) 20 MG tablet, Take 20 mg by mouth daily., Disp: , Rfl:  .  dofetilide (TIKOSYN) 250 MCG capsule, Take 250 mcg by mouth 2 (two) times daily., Disp: , Rfl:  .  EDARBI 80 MG TABS, TAKE 1 TABLET BY MOUTH EVERY DAY, Disp: 30 tablet, Rfl: 6 .  fluticasone furoate-vilanterol (BREO ELLIPTA) 200-25 MCG/INH AEPB, Inhale 1 puff into the lungs daily., Disp: , Rfl:  .  hydrALAZINE (APRESOLINE) 100 MG tablet, TAKE 1 TABLET BY MOUTH TWICE A DAY, Disp: 60 tablet, Rfl: 6 .  INCRUSE ELLIPTA 62.5 MCG/INH AEPB, INHALE 1 PUFF BY MOUTH EVERY DAY, Disp: , Rfl: 0 .  Nebivolol HCl (BYSTOLIC) 20 MG TABS, Take 1 tablet by mouth daily., Disp: , Rfl:  .  rivaroxaban (XARELTO) 20 MG TABS tablet, Take 20 mg by mouth daily with supper., Disp: , Rfl:   Past Medical History: Past Medical History  Diagnosis Date  . Essential hypertension   . COPD (chronic obstructive pulmonary disease) (HCC)   . Herpes zoster   . Vitamin D deficiency   . Hyperlipidemia   . Anxiety   . PAF (paroxysmal atrial fibrillation) (HCC)     Followed by Dr. Leonie Green in [place], also [facility] EP assessment  by Dr. Christin Fudge    Tobacco Use: History  Smoking status  . Former Smoker -- 0.50 packs/day for 30 years  . Types: Cigarettes  . Start date: 11/03/1977  . Quit date: 06/01/2014  Smokeless tobacco  . Never Used    Labs:     Recent Review Flowsheet Data    There is no flowsheet data to display.      Capillary Blood Glucose: No results found for: GLUCAP   ADL UCSD:     Pulmonary Assessment Scores      11/23/15 1440       ADL UCSD   ADL Phase Entry     SOB Score total 77     Rest 0     Walk 2     Stairs 5     Bath 3     Dress 3     Shop 3        Pulmonary Function Assessment:     Pulmonary Function Assessment - 11/23/15 1639    Breath   Bilateral Breath Sounds Clear   Shortness of Breath Yes      Exercise Target Goals:    Exercise Program Goal: Individual exercise prescription set with THRR, safety & activity barriers. Participant demonstrates ability to understand and report RPE using BORG scale, to self-measure pulse accurately, and to acknowledge  the importance of the exercise prescription.  Exercise Prescription Goal: Starting with aerobic activity 30 plus minutes a day, 3 days per week for initial exercise prescription. Provide home exercise prescription and guidelines that participant acknowledges understanding prior to discharge.  Activity Barriers & Risk Stratification:     Activity Barriers & Cardiac Risk Stratification - 11/23/15 1637    Activity Barriers & Cardiac Risk Stratification   Activity Barriers None   Cardiac Risk Stratification Low      6 Minute Walk:     6 Minute Walk      11/23/15 1442       6 Minute Walk   Phase Initial     Distance 850 feet     Walk Time 6 minutes     # of Rest Breaks 0     MPH 1.61     METS 2.23     RPE 13     Perceived Dyspnea  11     VO2 Peak 8.87     Symptoms No     Resting HR 58 bpm     Resting BP 152/80 mmHg     Max Ex. HR 91 bpm     Max Ex. BP 198/92 mmHg     2 Minute Post BP  168/88 mmHg     Interval Oxygen   Interval Oxygen? --  Patient used 2L [place] O2 Contiuous during test     Pre/Post BP   Baseline BP 152/80 mmHg     6 Minute BP 198/92 mmHg     Pre/Post BP? Yes        Initial Exercise Prescription:     Initial Exercise Prescription - 11/23/15 1455    Date of Initial Exercise RX and Referring Provider   Date 11/23/15   Oxygen   Oxygen Continuous   Liters 2   Treadmill   MPH 1   Grade 0   Minutes 15   METs 1.77   NuStep   Level 2   Minutes 15   METs 1.5   Arm Ergometer   Level 1   Minutes 15   METs 1   Prescription Details   Frequency (times per week) 2   Intensity   THRR REST +  20   THRR 40-80% of Max Heartrate 96-115   Ratings of Perceived Exertion 11-13   Perceived Dyspnea 2-4   Progression   Progression Continue to progress workloads to maintain intensity without signs/symptoms of physical distress.   Resistance Training   Training Prescription Yes   Weight 1.0   Reps 10-12      Perform Capillary Blood Glucose checks as needed.  Exercise Prescription Changes:      Exercise Prescription Changes      12/12/15 1300 01/14/16 1900         Exercise Review   Progression Yes Yes      Response to Exercise   Blood Pressure (Admit) 118/74 mmHg 190/98 mmHg      Blood Pressure (Exercise) 142/86 mmHg 168/88 mmHg      Blood Pressure (Exit) 132/72 mmHg 182/88 mmHg      Heart Rate (Admit) 74 bpm 55 bpm      Heart Rate (Exercise) 73 bpm 62 bpm      Heart Rate (Exit) 60 bpm 62 bpm      Oxygen Saturation (Admit) 94 % 93 %      Oxygen Saturation (Exercise) 98 % 99 %      Oxygen Saturation (Exit) 92 %  93 %      Rating of Perceived Exertion (Exercise) 12 11      Perceived Dyspnea (Exercise) 12 10      Symptoms No No      Comments  BP elevated to to not being able to sleep the night before and learning that she was terminated from her job.       Progression   Progression Continue to progress workloads to maintain intensity without  signs/symptoms of physical distress. Continue to progress workloads to maintain intensity without signs/symptoms of physical distress.      Resistance Training   Training Prescription Yes Yes      Weight 1.0 2.0      Reps 10-12 10-12      Oxygen   Oxygen Continuous Continuous      Liters 2 2      Treadmill   MPH 1       Grade 0       Minutes 15       METs 1.77       NuStep   Level 2 2      Minutes 15 15      METs 3.51 3.51      Arm Ergometer   Level 1.5 1.5      Minutes 15 15      METs 1.5 2      Home Exercise Plan   Plans to continue exercise at  Home      Frequency  Add 2 additional days to program exercise sessions.         Exercise Comments:      Exercise Comments      01/14/16 1939           Exercise Comments Patient progressing appropriately.           Discharge Exercise Prescription (Final Exercise Prescription Changes):     Exercise Prescription Changes - 01/14/16 1900    Exercise Review   Progression Yes   Response to Exercise   Blood Pressure (Admit) 190/98 mmHg   Blood Pressure (Exercise) 168/88 mmHg   Blood Pressure (Exit) 182/88 mmHg   Heart Rate (Admit) 55 bpm   Heart Rate (Exercise) 62 bpm   Heart Rate (Exit) 62 bpm   Oxygen Saturation (Admit) 93 %   Oxygen Saturation (Exercise) 99 %   Oxygen Saturation (Exit) 93 %   Rating of Perceived Exertion (Exercise) 11   Perceived Dyspnea (Exercise) 10   Symptoms No   Comments BP elevated to to not being able to sleep the night before and learning that she was terminated from her job.    Progression   Progression Continue to progress workloads to maintain intensity without signs/symptoms of physical distress.   Resistance Training   Training Prescription Yes   Weight 2.0   Reps 10-12   Oxygen   Oxygen Continuous   Liters 2   NuStep   Level 2   Minutes 15   METs 3.51   Arm Ergometer   Level 1.5   Minutes 15   METs 2   Home Exercise Plan   Plans to continue exercise at Home    Frequency Add 2 additional days to program exercise sessions.      Nutrition:  Target Goals: Understanding of nutrition guidelines, daily intake of sodium 1500mg , cholesterol 200mg , calories 30% from fat and 7% or less from saturated fats, daily to have 5 or more servings of fruits and vegetables.  Biometrics:  Pre Biometrics - 11/23/15 1430    Pre Biometrics   Height 5\' 5"  (1.651 m)   Weight 173 lb 9.6 oz (78.744 kg)   Waist Circumference 35 inches   Hip Circumference 39 inches   Waist to Hip Ratio 0.9 %   BMI (Calculated) 28.9   Triceps Skinfold 24 mm   % Body Fat 38.5 %   Grip Strength 66 kg   Flexibility 12 in   Single Leg Stand 24 seconds       Nutrition Therapy Plan and Nutrition Goals:     Nutrition Therapy & Goals - 11/23/15 1603    Intervention Plan   Intervention Prescribe, educate and counsel regarding individualized specific dietary modifications aiming towards targeted core components such as weight, hypertension, lipid management, diabetes, heart failure and other comorbidities.;Nutrition handout(s) given to patient.      Nutrition Discharge: Rate Your Plate Scores:     Nutrition Assessments - 11/23/15 1721    Rate Your Plate Scores   Pre Score 55      Psychosocial: Target Goals: Acknowledge presence or absence of depression, maximize coping skills, provide positive support system. Participant is able to verbalize types and ability to use techniques and skills needed for reducing stress and depression.  Initial Review & Psychosocial Screening:     Initial Psych Review & Screening - 11/23/15 1701    Initial Review   Current issues with --  None   Family Dynamics   Good Support System? Yes   Barriers   Psychosocial barriers to participate in program There are no identifiable barriers or psychosocial needs.   Screening Interventions   Interventions Encouraged to exercise      Quality of Life Scores:     Quality of Life - 11/23/15 1435     Quality of Life Scores   Health/Function Pre 17.41 %   Socioeconomic Pre 24.36 %   Psych/Spiritual Pre 22.36 %   Family Pre 28.8 %   GLOBAL Pre 21.41 %      PHQ-9:     Recent Review Flowsheet Data    Depression screen [facility] 2/9 11/23/2015   Decreased Interest 1   Down, Depressed, Hopeless 1   PHQ - 2 Score 2   Altered sleeping 0   Tired, decreased energy 1   Change in appetite 1   Feeling bad or failure about yourself  0   Trouble concentrating 0   Moving slowly or fidgety/restless 0   Suicidal thoughts 0   PHQ-9 Score 4   Difficult doing work/chores Somewhat difficult      Psychosocial Evaluation and Intervention:     Psychosocial Evaluation - 11/23/15 1702    Psychosocial Evaluation & Interventions   Interventions Encouraged to exercise with the program and follow exercise prescription   Continued Psychosocial Services Needed No      Psychosocial Re-Evaluation:     Psychosocial Re-Evaluation      12/12/15 1508 01/13/16 1447         Psychosocial Re-Evaluation   Interventions Encouraged to attend Pulmonary Rehabilitation for the exercise Stress management education;Relaxation education;Encouraged to attend Pulmonary Rehabilitation for the exercise      Continued Psychosocial Services Needed No No         Education: Education Goals: Education classes will be provided on a weekly basis, covering required topics. Participant will state understanding/return demonstration of topics presented.  Learning Barriers/Preferences:     Learning Barriers/Preferences - 11/23/15 1638    Learning Barriers/Preferences   Learning Barriers  None   Learning Preferences Verbal Instruction      Education Topics: How Lungs Work and Diseases: - Discuss the anatomy of the lungs and diseases that can affect the lungs, such as COPD.   Exercise: -Discuss the importance of exercise, FITT principles of exercise, normal and abnormal responses to exercise, and how to exercise  safely.   Environmental Irritants: -Discuss types of environmental irritants and how to limit exposure to environmental irritants.   Meds/Inhalers and oxygen: - Discuss respiratory medications, definition of an inhaler and oxygen, and the proper way to use an inhaler and oxygen.   Energy Saving Techniques: - Discuss methods to conserve energy and decrease shortness of breath when performing activities of daily living.    Bronchial Hygiene / Breathing Techniques: - Discuss breathing mechanics, pursed-lip breathing technique,  proper posture, effective ways to clear airways, and other functional breathing techniques   Cleaning Equipment: - Provides group verbal and written instruction about the health risks of elevated stress, cause of high stress, and healthy ways to reduce stress.   Nutrition I: Fats: - Discuss the types of cholesterol, what cholesterol does to the body, and how cholesterol levels can be controlled.      PULMONARY REHAB OTHER RESPIRATORY from 12/08/2015 in [place]   Date  12/08/15   Educator  Truddie CrumbleAlysia Turman   Instruction Review Code  2- meets goals/outcomes      Nutrition II: Labels: -Discuss the different components of food labels and how to read food labels.   Respiratory Infections: - Discuss the signs and symptoms of respiratory infections, ways to prevent respiratory infections, and the importance of seeking medical treatment when having a respiratory infection.      PULMONARY REHAB OTHER RESPIRATORY from 12/22/2015 in [place]   Date  12/22/15   Educator  Faith RoguePatti Kreigel   Instruction Review Code  2- meets goals/outcomes      Stress I: Signs and Symptoms: - Discuss the causes of stress, how stress may lead to anxiety and depression, and ways to limit stress.      PULMONARY REHAB OTHER RESPIRATORY from 01/12/2016 in [place] [facility]   Date  12/29/15   Educator  Hart Rochesteriane Lun Muro   Instruction  Review Code  2- meets goals/outcomes      Stress II: Relaxation: -Discuss relaxation techniques to limit stress.      PULMONARY REHAB OTHER RESPIRATORY from 01/12/2016 in [facility]   Date  01/05/16   Educator  Hart Rochesteriane Jaydon Avina   Instruction Review Code  2- meets goals/outcomes      Oxygen for Home/Travel: - Discuss how to prepare for travel when on oxygen and proper ways to transport and store oxygen to ensure safety.          [facility]UL[facility]ARY REHAB OTHER [facility]IR[facility]   Date  01/12/16   Educator  Hart Rochesteriane Myrissa Chipley   Instruction Review Code  2- meets goals/outcomes      Knowledge Questionnaire Score:     Knowledge Questionnaire Score - 11/23/15 1638    Knowledge Questionnaire Score   Pre Score 9/14      Core Components/Risk Factors/Patient Goals at Admission:     Personal Goals and Risk Factors at Admission - 11/23/15 1641    Core Components/Risk Factors/Patient Goals on Admission    Weight Management Yes   Intervention Weight Management: Provide education and appropriate resources to help participant work on and attain dietary goals.  Admit Weight 173 lb 9.6 oz (78.744 kg)   Goal Weight: Short Term 168 lb 9.6 oz (76.476 kg)   Goal Weight: Long Term 165 lb (74.844 kg)   Expected Outcomes Short Term: Continue to assess and modify interventions until short term weight is achieved.;Long Term: Adherence to nutrition and physical activity/exercise program aimed toward attainment of established weight goal.   Sedentary Yes   Intervention Develop an individualized exercise prescription for aerobic and resistive training based on initial evaluation findings, risk stratification, comorbidities and participant's personal goals.;Provide advice, education, support and counseling about physical activity/exercise needs.   Expected Outcomes Achievement of increased cardiorespiratory fitness and enhanced flexibility, muscular  endurance and strength shown through measurements of functional capaciy and personal statement of participant.   Tobacco Cessation --  Patient quit 06/01/2014   Improve shortness of breath with ADL's Yes   Intervention Provide education, individualized exercise plan and daily activity instruction to help decrease symptoms of SOB with activities of daily living.   Expected Outcomes Short Term: Achieves a reduction of symptoms when performing activities of daily living.   Develop more efficient breathing techniques such as purse lipped breathing and diaphragmatic breathing; and practicing self-pacing with activity Yes   Intervention Provide education, demonstration and support about specific breathing techniuqes utilized for more efficient breathing. Include techniques such as pursed lipped breathing, diaphragmatic breathing and self-pacing activity.   Expected Outcomes Short Term: Participant will be able to demonstrate and use breathing techniques as needed throughout daily activities.   Diabetes --  Not diabetic   Hypertension Yes   Expected Outcomes Short Term: Continued assessment and intervention until BP is < 140/89mm HG in hypertensive participants. < 130/61mm HG in hypertensive participants with diabetes, heart failure or chronic kidney disease.;Long Term: Maintenance of blood pressure at goal levels.   Lipids --  Levels unknow at aorientation visit   Stress Yes   Intervention Offer individual and/or small group education and counseling on adjustment to heart disease, stress management and health-related lifestyle change. Teach and support self-help strategies.   Expected Outcomes Long Term: Emotional wellbeing is indicated by absence of clinically significant psychosocial distress or social isolation.   Personal Goal Other Yes   Personal Goal Get strength back in arms and legs, be able to fix my own hair, use O2 at night only, be able to go shopping w/o O2   Intervention Continue to  exercise with regularity, loss about 10lbs, embrace breathing techniques   Expected Outcomes Become stronger and not be so SOB and decrease use of oxygen      Core Components/Risk Factors/Patient Goals Review:      Goals and Risk Factor Review      11/23/15 1651 12/12/15 1507 01/13/16 1446       Core Components/Risk Factors/Patient Goals Review   Personal Goals Review Sedentary;Develop more efficient breathing techniques such as purse lipped breathing and diaphragmatic breathing and practicing self-pacing with activity.;Stress;Improve shortness of breath with ADL's Sedentary;Develop more efficient breathing techniques such as purse lipped breathing and diaphragmatic breathing and practicing self-pacing with activity.;Stress;Improve shortness of breath with ADL's;Hypertension;Increase Strength and Stamina Sedentary;Develop more efficient breathing techniques such as purse lipped breathing and diaphragmatic breathing and practicing self-pacing with activity.;Stress;Improve shortness of breath with ADL's;Hypertension;Increase Strength and Stamina     Review Reviewed goals and discussed what we are planning to reach these goals.  Reviewed goals and discussed what we are planning to reach these goals.  Reviewed goals and discussed what we are planning to  reach these goals. Patient is progressing well in the program     Expected Outcomes Improve sedentary lifestyle with exercise regularity, lose weight, use breathing techniques Improve sedentary lifestyle with exercise regularity, lose weight, use breathing techniques Improve sedentary lifestyle with exercise regularity, lose weight, use breathing techniques        Core Components/Risk Factors/Patient Goals at Discharge (Final Review):      Goals and Risk Factor Review - 01/13/16 1446    Core Components/Risk Factors/Patient Goals Review   Personal Goals Review Sedentary;Develop more efficient breathing techniques such as purse lipped breathing and  diaphragmatic breathing and practicing self-pacing with activity.;Stress;Improve shortness of breath with ADL's;Hypertension;Increase Strength and Stamina   Review Reviewed goals and discussed what we are planning to reach these goals. Patient is progressing well in the program   Expected Outcomes Improve sedentary lifestyle with exercise regularity, lose weight, use breathing techniques      ITP Comments:   Comments:  ITP REVIEW Pt is making expected progress toward personal goals after completing 13 sessions.   Recommend continued exercise, life style modification, education, and utilization of breathing techniques to increase stamina and strength and decrease shortness of breath with exertion.

## 2016-01-17 ENCOUNTER — Encounter (HOSPITAL_COMMUNITY)
Admission: RE | Admit: 2016-01-17 | Discharge: 2016-01-17 | Disposition: A | Discharge status: Home / Self Care | Payer: Medicare Other | Source: Ambulatory Visit | Attending: Pulmonary Disease | Admitting: Pulmonary Disease

## 2016-01-17 DIAGNOSIS — R0602 Shortness of breath: Secondary | ICD-10-CM | POA: Insufficient documentation

## 2016-01-19 ENCOUNTER — Encounter (HOSPITAL_COMMUNITY): Payer: Medicare Other

## 2016-01-24 ENCOUNTER — Encounter (HOSPITAL_COMMUNITY)
Admission: RE | Admit: 2016-01-24 | Discharge: 2016-01-24 | Disposition: A | Discharge status: Home / Self Care | Payer: Medicare Other | Source: Ambulatory Visit | Attending: Pulmonary Disease | Admitting: Pulmonary Disease

## 2016-01-24 DIAGNOSIS — R0602 Shortness of breath: Secondary | ICD-10-CM | POA: Diagnosis not present

## 2016-01-26 ENCOUNTER — Encounter (HOSPITAL_COMMUNITY)
Admission: RE | Admit: 2016-01-26 | Discharge: 2016-01-26 | Disposition: A | Discharge status: Home / Self Care | Payer: Medicare Other | Source: Ambulatory Visit | Attending: Pulmonary Disease | Admitting: Pulmonary Disease

## 2016-01-26 DIAGNOSIS — R0602 Shortness of breath: Secondary | ICD-10-CM | POA: Diagnosis not present

## 2016-01-31 ENCOUNTER — Encounter (HOSPITAL_COMMUNITY)
Admission: RE | Admit: 2016-01-31 | Discharge: 2016-01-31 | Disposition: A | Discharge status: Home / Self Care | Payer: Medicare Other | Source: Ambulatory Visit | Attending: Pulmonary Disease | Admitting: Pulmonary Disease

## 2016-01-31 DIAGNOSIS — R0602 Shortness of breath: Secondary | ICD-10-CM | POA: Diagnosis not present

## 2016-02-02 ENCOUNTER — Ambulatory Visit (INDEPENDENT_AMBULATORY_CARE_PROVIDER_SITE_OTHER): Payer: Medicare Other | Admitting: Cardiology

## 2016-02-02 ENCOUNTER — Encounter (HOSPITAL_COMMUNITY)
Admission: RE | Admit: 2016-02-02 | Discharge: 2016-02-02 | Disposition: A | Discharge status: Home / Self Care | Payer: Medicare Other | Source: Ambulatory Visit | Attending: Pulmonary Disease | Admitting: Pulmonary Disease

## 2016-02-02 ENCOUNTER — Encounter: Payer: Self-pay | Admitting: Cardiology

## 2016-02-02 VITALS — BP 160/72 | HR 68 | Ht 66.0 in | Wt 165.0 lb

## 2016-02-02 DIAGNOSIS — I480 Paroxysmal atrial fibrillation: Secondary | ICD-10-CM | POA: Diagnosis not present

## 2016-02-02 DIAGNOSIS — R0602 Shortness of breath: Secondary | ICD-10-CM | POA: Diagnosis not present

## 2016-02-02 DIAGNOSIS — J449 Chronic obstructive pulmonary disease, unspecified: Secondary | ICD-10-CM | POA: Diagnosis not present

## 2016-02-02 DIAGNOSIS — I10 Essential (primary) hypertension: Secondary | ICD-10-CM

## 2016-02-02 NOTE — Progress Notes (Signed)
Cardiology Office Note  Date: 02/02/2016   ID: Tracey Dudley, DOB [birth year], MRN [id number]  PCP: Ocie Bob, FNP  Primary Cardiologist: Rozann Lesches, MD   Chief Complaint  Patient presents with  . Atrial Fibrillation    History of Present Illness: Tracey Dudley is a 70 y.o. female that I met for the first time in February. She presents for a follow-up visit. Complains of no specific sense of palpitations. She has been participating in the pulmonary rehabilitation program with oxygen requiring COPD. States she has more stamina and improved muscle strength in general.  We reviewed her medications which are outlined below. Cardiac regimen includes Xarelto, Tikosyn, hydralazine, and Bystolic. I reviewed her ECG from March. She had lab work earlier this year as well.  Past Medical History  Diagnosis Date  . Essential hypertension   . COPD (chronic obstructive pulmonary disease) ([place])   . Herpes zoster   . Vitamin D deficiency   . Hyperlipidemia   . Anxiety   . PAF (paroxysmal atrial fibrillation) ([facility])     Followed by Dr. Gibson Ramp in [place], also [place] EP assessment by Dr. Westley Gambles    Current Outpatient Prescriptions  Medication Sig Dispense Refill  . acetaminophen (TYLENOL) 500 MG tablet Take 1,000 mg by mouth every 6 (six) hours as needed for moderate pain.    Marland Kitchen albuterol (PROVENTIL HFA;VENTOLIN HFA) 108 (90 BASE) MCG/ACT inhaler Inhale 1 puff into the lungs every 6 (six) hours as needed for wheezing or shortness of breath.    . ALPRAZolam (XANAX) 0.5 MG tablet Take 0.5 mg by mouth 3 (three) times daily as needed for anxiety.    Marland Kitchen atorvastatin (LIPITOR) 20 MG tablet Take 20 mg by mouth daily.    Marland Kitchen dofetilide (TIKOSYN) 250 MCG capsule Take 250 mcg by mouth 2 (two) times daily.    Marland Kitchen EDARBI 80 MG TABS TAKE 1 TABLET BY MOUTH EVERY DAY 30 tablet 6  . fluticasone furoate-vilanterol (BREO ELLIPTA) 200-25 MCG/INH AEPB Inhale 1 puff into the lungs daily.    . hydrALAZINE  (APRESOLINE) 100 MG tablet TAKE 1 TABLET BY MOUTH TWICE A DAY 60 tablet 6  . INCRUSE ELLIPTA 62.5 MCG/INH AEPB INHALE 1 PUFF BY MOUTH EVERY DAY  0  . Nebivolol HCl (BYSTOLIC) 20 MG TABS Take 1 tablet by mouth daily.    . rivaroxaban (XARELTO) 20 MG TABS tablet Take 20 mg by mouth daily with supper.     No current facility-administered medications for this visit.   Allergies:  Metoprolol and Erythromycin   Social History: The patient  reports that she quit smoking about 20 months ago. Her smoking use included Cigarettes. She started smoking about 38 years ago. She has a 15 pack-year smoking history. She has never used smokeless tobacco. She reports that she does not drink alcohol or use illicit drugs.   ROS:  Please see the history of present illness. Otherwise, complete review of systems is positive for chronic dyspnea on exertion.  All other systems are reviewed and negative.   Physical Exam: VS:  BP 160/72 mmHg  Pulse 68  Ht 5' 6"  (1.676 m)  Wt 165 lb (74.844 kg)  BMI 26.64 kg/m2  SpO2 92%, BMI Body mass index is 26.64 kg/(m^2).  Wt Readings from Last 3 Encounters:  02/02/16 165 lb (74.844 kg)  11/29/15 165 lb (74.844 kg)  11/23/15 173 lb 9.6 oz (78.744 kg)    General: Chronically ill-appearing woman, comfortable at rest. Wearing oxygen via  nasal cannula. HEENT: Conjunctiva and lids normal, oropharynx clear. Neck: Supple, no elevated JVP or carotid bruits, no thyromegaly. Lungs: Clear to auscultation, nonlabored breathing at rest. Cardiac: Regular rate and rhythm, no S3, soft systolic murmur, no pericardial rub. Abdomen: Soft, nontender, bowel sounds present, no guarding or rebound. Extremities: No pitting edema, distal pulses 2+. Skin: Warm and dry. Musculoskeletal: No kyphosis. Neuropsychiatric: Alert and oriented x3, affect grossly appropriate.  ECG: I personally reviewed the prior tracing from 11/29/2015 which showed sinus bradycardia with QTc 453 ms.  Recent  Labwork: 10/08/2015: ALT 20; AST 18; B Natriuretic Peptide 735.0* 11/04/2015: Magnesium 1.9 11/29/2015: BUN 12; Creatinine, Ser 0.95; Hemoglobin 10.7*; Platelets 125*; Potassium 3.9; Sodium 142   Other Studies Reviewed Today:  Echocardiogram 10/10/2015: Study Conclusions  - Left ventricle: The cavity size was normal. Wall thickness was increased in a pattern of mild LVH. Systolic function was vigorous. The estimated ejection fraction was in the range of 75% to 80%. Wall motion was normal; there were no regional wall motion abnormalities. Features are consistent with a pseudonormal left ventricular filling pattern, with concomitant abnormal relaxation and increased filling pressure (grade 2 diastolic dysfunction). - Aortic valve: Poorly visualized. Mildly calcified annulus. Probably trileaflet. Mean gradient (S): 13 mm Hg. Peak gradient (S): 24 mm Hg. Gradients likely increased due to relatively small LVOT and vigorous contraction. Limited views of valve leaflets show grossly preserved excursion. - Mitral valve: Mildly thickened leaflets . There was mild regurgitation. - Left atrium: The atrium was at the upper limits of normal in size. - Right atrium: Central venous pressure (est): 3 mm Hg. - Tricuspid valve: There was trivial regurgitation. - Pulmonary arteries: Systolic pressure could not be accurately estimated. - Pericardium, extracardiac: A prominent pericardial fat pad was present.  Impressions:  - Mild LVH with LVEF 75-80% and grade 2 diastolic dysfunction with increased filling pressures. Normal left atrial chamber size. Mildly thickened mitral leaflets with mild mitral regurgitation. Aortic valve appears mildly sclerotic without definitive stenosis. Trivial tricuspid regurgitation, PASP not estimated.  Assessment and Plan:  1. Paroxysmal atrial fibrillation, symptomatically controlled on current regimen including Xarelto and  Tikosyn. No changes made today. We will arrange follow-up in 6 months with repeat lab work.  2. Oxygen requiring COPD, continues in the pulmonary rehabilitation program. She follows with Dr. Luan Pulling.  3. Essential hypertension.  Current medicines were reviewed with the patient today.   Orders Placed This Encounter  Procedures  . CBC  . Basic Metabolic Panel (BMET)  . Magnesium    Disposition: FU with me in 6 months.   Signed, Satira Sark, MD, [facility] 02/02/2016 11:11 AM    [facility] [facility] [address], [place], [place] [address] Phone: [phone]; Fax: [phone]

## 2016-02-02 NOTE — Patient Instructions (Signed)
Your physician wants you to follow-up in: 6 months with Dr Randa SpikeMcDowell You will receive a reminder letter in the mail two months in advance. If you don't receive a letter, please call our office to schedule the follow-up appointment.   Pleas get blood work a few days BEFORE NEXT APT  Your physician recommends that you continue on your current medications as directed. Please refer to the Current Medication list given to you today.     Thank you for choosing [facility] !

## 2016-02-07 ENCOUNTER — Encounter (HOSPITAL_COMMUNITY)
Admission: RE | Admit: 2016-02-07 | Discharge: 2016-02-07 | Disposition: A | Discharge status: Home / Self Care | Payer: Medicare Other | Source: Ambulatory Visit | Attending: Pulmonary Disease | Admitting: Pulmonary Disease

## 2016-02-07 DIAGNOSIS — R0602 Shortness of breath: Secondary | ICD-10-CM | POA: Diagnosis not present

## 2016-02-07 NOTE — Progress Notes (Signed)
Daily Session Note  Patient Details  Name: Tracey Dudley MRN: [id number] Date of Birth: April 06, 1946 Referring Provider:    Encounter Date: 02/07/2016  Check In:     Session Check In - 02/07/16 1330    Check-In   Location [facility]   Staff Present Russella Dar, MS, EP, [facility], Exercise Physiologist   Supervising physician immediately available to respond to emergencies See telemetry face sheet for immediately available MD   Medication changes reported     No   Fall or balance concerns reported    No   Warm-up and Cool-down Performed as group-led instruction   Resistance Training Performed Yes   VAD Patient? No   Pain Assessment   Pain Score 0-No pain   Multiple Pain Sites No      Capillary Blood Glucose: No results found for this or any previous visit (from the past 24 hour(s)).   Goals Met:  Proper associated with RPD/PD & O2 Sat Independence with exercise equipment Improved SOB with ADL's Exercise tolerated well Strength training completed today  Goals Unmet:  RPE BP HR O2 Sat  Comments: Patient is progressing appropriately. Her BP is been consistently elevated when she comes to class.    Dr. Kate Sable is Medical Director for [facility] Cardiac and Pulmonary Rehab.

## 2016-02-09 ENCOUNTER — Encounter (HOSPITAL_COMMUNITY)
Admission: RE | Admit: 2016-02-09 | Discharge: 2016-02-09 | Disposition: A | Discharge status: Home / Self Care | Payer: Medicare Other | Source: Ambulatory Visit | Attending: Pulmonary Disease | Admitting: Pulmonary Disease

## 2016-02-09 DIAGNOSIS — R0602 Shortness of breath: Secondary | ICD-10-CM

## 2016-02-09 NOTE — Progress Notes (Signed)
Daily Session Note  Patient Details  Name: Tracey Dudley MRN: [id number] Date of Birth: 1946-09-17 Referring Provider:    Encounter Date: 02/09/2016  Check In:     Session Check In - 02/09/16 1315    Check-In   Location [facility]   Staff Present Lorayne Getchell Angelina Pih, MS, EP, [facility], Exercise Physiologist  Nils Flack, EP, Boneta Lucks, BSN   Supervising physician immediately available to respond to emergencies See telemetry face sheet for immediately available MD   Medication changes reported     No   Fall or balance concerns reported    No   Warm-up and Cool-down Performed as group-led instruction   Resistance Training Performed Yes   VAD Patient? No   Pain Assessment   Currently in Pain? No/denies   Multiple Pain Sites No      Capillary Blood Glucose: No results found for this or any previous visit (from the past 24 hour(s)).   Goals Met:  Proper associated with RPD/PD & O2 Sat Independence with exercise equipment Improved SOB with ADL's Using PLB without cueing & demonstrates good technique Exercise tolerated well No report of cardiac concerns or symptoms Strength training completed today  Goals Unmet:  RPE PD BP HR O2 Sat  Comments: Patient is progressing appropriately. Check out: 1430.   Dr. Kate Sable is Medical Director for [facility] Cardiac and Pulmonary Rehab.

## 2016-02-13 NOTE — Progress Notes (Signed)
Pulmonary Individual Treatment Plan  Patient Details  Name: Tracey Dudley MRN: [id number] Date of Birth: Jan 24, 1946 Referring Provider:    Initial Encounter Date:       PULMONARY REHAB OTHER RESP ORIENTATION from 11/23/2015 in [facility]   Date  11/23/15      Visit Diagnosis: SOB (shortness of breath)  Patient's Home Medications on Admission:   Current outpatient prescriptions:  .  acetaminophen (TYLENOL) 500 MG tablet, Take 1,000 mg by mouth every 6 (six) hours as needed for moderate pain., Disp: , Rfl:  .  albuterol (PROVENTIL HFA;VENTOLIN HFA) 108 (90 BASE) MCG/ACT inhaler, Inhale 1 puff into the lungs every 6 (six) hours as needed for wheezing or shortness of breath., Disp: , Rfl:  .  ALPRAZolam (XANAX) 0.5 MG tablet, Take 0.5 mg by mouth 3 (three) times daily as needed for anxiety., Disp: , Rfl:  .  atorvastatin (LIPITOR) 20 MG tablet, Take 20 mg by mouth daily., Disp: , Rfl:  .  dofetilide (TIKOSYN) 250 MCG capsule, Take 250 mcg by mouth 2 (two) times daily., Disp: , Rfl:  .  EDARBI 80 MG TABS, TAKE 1 TABLET BY MOUTH EVERY DAY, Disp: 30 tablet, Rfl: 6 .  fluticasone furoate-vilanterol (BREO ELLIPTA) 200-25 MCG/INH AEPB, Inhale 1 puff into the lungs daily., Disp: , Rfl:  .  hydrALAZINE (APRESOLINE) 100 MG tablet, TAKE 1 TABLET BY MOUTH TWICE A DAY, Disp: 60 tablet, Rfl: 6 .  INCRUSE ELLIPTA 62.5 MCG/INH AEPB, INHALE 1 PUFF BY MOUTH EVERY DAY, Disp: , Rfl: 0 .  Nebivolol HCl (BYSTOLIC) 20 MG TABS, Take 1 tablet by mouth daily., Disp: , Rfl:  .  rivaroxaban (XARELTO) 20 MG TABS tablet, Take 20 mg by mouth daily with supper., Disp: , Rfl:   Past Medical History: Past Medical History  Diagnosis Date  . Essential hypertension   . COPD (chronic obstructive pulmonary disease) (HCC)   . Herpes zoster   . Vitamin D deficiency   . Hyperlipidemia   . Anxiety   . PAF (paroxysmal atrial fibrillation) (HCC)     Followed by Dr. Leonie Green in [place] [employer], also [facility]  assessment by Dr. Christin Fudge    Tobacco Use: History  Smoking status  . Former Smoker -- 0.50 packs/day for 30 years  . Types: Cigarettes  . Start date: 11/03/1977  . Quit date: 06/01/2014  Smokeless tobacco  . Never Used    Labs:     Recent Review Flowsheet Data    There is no flowsheet data to display.      Capillary Blood Glucose: No results found for: GLUCAP   ADL UCSD:     Pulmonary Assessment Scores      11/23/15 1440       ADL UCSD   ADL Phase Entry     SOB Score total 77     Rest 0     Walk 2     Stairs 5     Bath 3     Dress 3     Shop 3        Pulmonary Function Assessment:     Pulmonary Function Assessment - 11/23/15 1639    Breath   Bilateral Breath Sounds Clear   Shortness of Breath Yes      Exercise Target Goals:    Exercise Program Goal: Individual exercise prescription set with THRR, safety & activity barriers. Participant demonstrates ability to understand and report RPE using BORG scale, to self-measure pulse accurately, and to  acknowledge the importance of the exercise prescription.  Exercise Prescription Goal: Starting with aerobic activity 30 plus minutes a day, 3 days per week for initial exercise prescription. Provide home exercise prescription and guidelines that participant acknowledges understanding prior to discharge.  Activity Barriers & Risk Stratification:     Activity Barriers & Cardiac Risk Stratification - 11/23/15 1637    Activity Barriers & Cardiac Risk Stratification   Activity Barriers None   Cardiac Risk Stratification Low      6 Minute Walk:     6 Minute Walk      11/23/15 1442       6 Minute Walk   Phase Initial     Distance 850 feet     Walk Time 6 minutes     # of Rest Breaks 0     MPH 1.61     METS 2.23     RPE 13     Perceived Dyspnea  11     VO2 Peak 8.87     Symptoms No     Resting HR 58 bpm     Resting BP 152/80 mmHg     Max Ex. HR 91 bpm     Max Ex. BP 198/92 mmHg     2 Minute  Post BP 168/88 mmHg     Interval Oxygen   Interval Oxygen? --  Patient used 2L [place] O2 Contiuous during test     Pre/Post BP   Baseline BP 152/80 mmHg     6 Minute BP 198/92 mmHg     Pre/Post BP? Yes        Initial Exercise Prescription:     Initial Exercise Prescription - 11/23/15 1455    Date of Initial Exercise RX and Referring Provider   Date 11/23/15   Oxygen   Oxygen Continuous   Liters 2   Treadmill   MPH 1   Grade 0   Minutes 15   METs 1.77   NuStep   Level 2   Minutes 15   METs 1.5   Arm Ergometer   Level 1   Minutes 15   METs 1   Prescription Details   Frequency (times per week) 2   Intensity   THRR REST +  20   THRR 40-80% of Max Heartrate 96-115   Ratings of Perceived Exertion 11-13   Perceived Dyspnea 2-4   Progression   Progression Continue to progress workloads to maintain intensity without signs/symptoms of physical distress.   Resistance Training   Training Prescription Yes   Weight 1.0   Reps 10-12      Perform Capillary Blood Glucose checks as needed.  Exercise Prescription Changes:      Exercise Prescription Changes      12/12/15 1300 01/14/16 1900 02/13/16 1000       Exercise Review   Progression Yes Yes Yes     Response to Exercise   Blood Pressure (Admit) 118/74 mmHg 190/98 mmHg 138/82 mmHg     Blood Pressure (Exercise) 142/86 mmHg 168/88 mmHg 122/78 mmHg     Blood Pressure (Exit) 132/72 mmHg 182/88 mmHg 128/64 mmHg     Heart Rate (Admit) 74 bpm 55 bpm 70 bpm     Heart Rate (Exercise) 73 bpm 62 bpm 72 bpm     Heart Rate (Exit) 60 bpm 62 bpm 58 bpm     Oxygen Saturation (Admit) 94 % 93 % 89 %     Oxygen Saturation (Exercise) 98 % 99 % 97 %  Oxygen Saturation (Exit) 92 % 93 % 97 %     Rating of Perceived Exertion (Exercise) 12 11 10      Perceived Dyspnea (Exercise) 12 10 10      Symptoms No No No     Comments  BP elevated to to not being able to sleep the night before and learning that she was terminated from her job.        Progression   Progression Continue to progress workloads to maintain intensity without signs/symptoms of physical distress. Continue to progress workloads to maintain intensity without signs/symptoms of physical distress. Continue to progress workloads to maintain intensity without signs/symptoms of physical distress.     Resistance Training   Training Prescription Yes Yes Yes     Weight 1.0 2.0 3     Reps 10-12 10-12 10-12     Interval Training   Interval Training   No     Oxygen   Oxygen Continuous Continuous Continuous     Liters 2 2 2      Treadmill   MPH 1       Grade 0       Minutes 15       METs 1.77       NuStep   Level 2 2 2      Minutes 15 15 15      METs 3.51 3.51 3.5     Arm Ergometer   Level 1.5 1.5 2     Minutes 15 15 15      METs 1.5 2 2.5     Home Exercise Plan   Plans to continue exercise at  Home Home     Frequency  Add 2 additional days to program exercise sessions. Add 2 additional days to program exercise sessions.        Exercise Comments:      Exercise Comments      01/14/16 1939 02/13/16 1047         Exercise Comments Patient progressing appropriately.  Patient is progressing appropriately, however her BP does continue to be elevated.          Discharge Exercise Prescription (Final Exercise Prescription Changes):     Exercise Prescription Changes - 02/13/16 1000    Exercise Review   Progression Yes   Response to Exercise   Blood Pressure (Admit) 138/82 mmHg   Blood Pressure (Exercise) 122/78 mmHg   Blood Pressure (Exit) 128/64 mmHg   Heart Rate (Admit) 70 bpm   Heart Rate (Exercise) 72 bpm   Heart Rate (Exit) 58 bpm   Oxygen Saturation (Admit) 89 %   Oxygen Saturation (Exercise) 97 %   Oxygen Saturation (Exit) 97 %   Rating of Perceived Exertion (Exercise) 10   Perceived Dyspnea (Exercise) 10   Symptoms No   Progression   Progression Continue to progress workloads to maintain intensity without signs/symptoms of physical distress.    Resistance Training   Training Prescription Yes   Weight 3   Reps 10-12   Interval Training   Interval Training No   Oxygen   Oxygen Continuous   Liters 2   NuStep   Level 2   Minutes 15   METs 3.5   Arm Ergometer   Level 2   Minutes 15   METs 2.5   Home Exercise Plan   Plans to continue exercise at Home   Frequency Add 2 additional days to program exercise sessions.      Nutrition:  Target Goals: Understanding of nutrition guidelines, daily  intake of sodium 1500mg , cholesterol 200mg , calories 30% from fat and 7% or less from saturated fats, daily to have 5 or more servings of fruits and vegetables.  Biometrics:     Pre Biometrics - 11/23/15 1430    Pre Biometrics   Height 5\' 5"  (1.651 m)   Weight 173 lb 9.6 oz (78.744 kg)   Waist Circumference 35 inches   Hip Circumference 39 inches   Waist to Hip Ratio 0.9 %   BMI (Calculated) 28.9   Triceps Skinfold 24 mm   % Body Fat 38.5 %   Grip Strength 66 kg   Flexibility 12 in   Single Leg Stand 24 seconds       Nutrition Therapy Plan and Nutrition Goals:     Nutrition Therapy & Goals - 11/23/15 1603    Intervention Plan   Intervention Prescribe, educate and counsel regarding individualized specific dietary modifications aiming towards targeted core components such as weight, hypertension, lipid management, diabetes, heart failure and other comorbidities.;Nutrition handout(s) given to patient.      Nutrition Discharge: Rate Your Plate Scores:     Nutrition Assessments - 11/23/15 1721    Rate Your Plate Scores   Pre Score 55      Psychosocial: Target Goals: Acknowledge presence or absence of depression, maximize coping skills, provide positive support system. Participant is able to verbalize types and ability to use techniques and skills needed for reducing stress and depression.  Initial Review & Psychosocial Screening:     Initial Psych Review & Screening - 11/23/15 1701    Initial Review    Current issues with --  None   Family Dynamics   Good Support System? Yes   Barriers   Psychosocial barriers to participate in program There are no identifiable barriers or psychosocial needs.   Screening Interventions   Interventions Encouraged to exercise      Quality of Life Scores:     Quality of Life - 11/23/15 1435    Quality of Life Scores   Health/Function Pre 17.41 %   Socioeconomic Pre 24.36 %   Psych/Spiritual Pre 22.36 %   Family Pre 28.8 %   GLOBAL Pre 21.41 %      PHQ-9:     Recent Review Flowsheet Data    Depression screen [facility] 2/9 11/23/2015   Decreased Interest 1   Down, Depressed, Hopeless 1   PHQ - 2 Score 2   Altered sleeping 0   Tired, decreased energy 1   Change in appetite 1   Feeling bad or failure about yourself  0   Trouble concentrating 0   Moving slowly or fidgety/restless 0   Suicidal thoughts 0   PHQ-9 Score 4   Difficult doing work/chores Somewhat difficult      Psychosocial Evaluation and Intervention:     Psychosocial Evaluation - 11/23/15 1702    Psychosocial Evaluation & Interventions   Interventions Encouraged to exercise with the program and follow exercise prescription   Continued Psychosocial Services Needed No      Psychosocial Re-Evaluation:     Psychosocial Re-Evaluation      12/12/15 1508 01/13/16 1447 02/08/16 1445       Psychosocial Re-Evaluation   Interventions Encouraged to attend Pulmonary Rehabilitation for the exercise Stress management education;Relaxation education;Encouraged to attend Pulmonary Rehabilitation for the exercise Stress management education;Relaxation education;Encouraged to attend Pulmonary Rehabilitation for the exercise     Continued Psychosocial Services Needed No No No  Education: Education Goals: Education classes will be provided on a weekly basis, covering required topics. Participant will state understanding/return demonstration of topics presented.  Learning  Barriers/Preferences:     Learning Barriers/Preferences - 11/23/15 1638    Learning Barriers/Preferences   Learning Barriers None   Learning Preferences Verbal Instruction      Education Topics: How Lungs Work and Diseases: - Discuss the anatomy of the lungs and diseases that can affect the lungs, such as COPD.   Exercise: -Discuss the importance of exercise, FITT principles of exercise, normal and abnormal responses to exercise, and how to exercise safely.      PULMONARY REHAB OTHER RESPIRATORY from 02/09/2016 in [facility]   Date  01/26/16   Educator  Hart Rochester   Instruction Review Code  2- meets goals/outcomes      Environmental Irritants: -Discuss types of environmental irritants and how to limit exposure to environmental irritants.      PULMONARY REHAB OTHER RESPIRATORY from 02/09/2016 in [place] [facility]   Date  02/02/16   Educator  DC   Instruction Review Code  2- meets goals/outcomes      Meds/Inhalers and oxygen: - Discuss respiratory medications, definition of an inhaler and oxygen, and the proper way to use an inhaler and oxygen.      PULMONARY REHAB OTHER RESPIRATORY from 02/09/2016 in [place] [facility]   Date  02/09/16   Educator  D Conley Pawling   Instruction Review Code  2- meets goals/outcomes      Energy Saving Techniques: - Discuss methods to conserve energy and decrease shortness of breath when performing activities of daily living.    Bronchial Hygiene / Breathing Techniques: - Discuss breathing mechanics, pursed-lip breathing technique,  proper posture, effective ways to clear airways, and other functional breathing techniques   Cleaning Equipment: - Provides group verbal and written instruction about the health risks of elevated stress, cause of high stress, and healthy ways to reduce stress.   Nutrition I: Fats: - Discuss the types of cholesterol, what cholesterol does to the body, and how  cholesterol levels can be controlled.      PULMONARY REHAB OTHER RESPIRATORY from 02/09/2016 in [place] [facility]   Date  12/08/15   Educator  Truddie Crumble   Instruction Review Code  2- meets goals/outcomes      Nutrition II: Labels: -Discuss the different components of food labels and how to read food labels.   Respiratory Infections: - Discuss the signs and symptoms of respiratory infections, ways to prevent respiratory infections, and the importance of seeking medical treatment when having a respiratory infection.      PULMONARY REHAB OTHER RESPIRATORY from 02/09/2016 in [place]   Date  12/22/15   Educator  Faith Rogue   Instruction Review Code  2- meets goals/outcomes      Stress I: Signs and Symptoms: - Discuss the causes of stress, how stress may lead to anxiety and depression, and ways to limit stress.      PULMONARY REHAB OTHER RESPIRATORY from 02/09/2016 in [place] [facility]   Date  12/29/15   Educator  Hart Rochester   Instruction Review Code  2- meets goals/outcomes      Stress II: Relaxation: -Discuss relaxation techniques to limit stress.      PULMONARY REHAB OTHER RESPIRATORY from 02/09/2016 in [facility]   Date  01/05/16   Educator  Hart Rochester   Instruction Review Code  2- meets goals/outcomes      Oxygen for Home/Travel: - Discuss how to prepare for travel when on oxygen and proper ways to transport and store oxygen to ensure safety.          PULMONARY REHAB OTHER RESPIRATORY from 02/09/2016 in [place] [facility]   Date  01/12/16   Educator  Hart Rochester   Instruction Review Code  2- meets goals/outcomes      Knowledge Questionnaire Score:     Knowledge Questionnaire Score - 11/23/15 1638    Knowledge Questionnaire Score   Pre Score 9/14      Core Components/Risk Factors/Patient Goals at Admission:     Personal Goals and Risk Factors at Admission -  11/23/15 1641    Core Components/Risk Factors/Patient Goals on Admission    Weight Management Yes   Intervention Weight Management: Provide education and appropriate resources to help participant work on and attain dietary goals.   Admit Weight 173 lb 9.6 oz (78.744 kg)   Goal Weight: Short Term 168 lb 9.6 oz (76.476 kg)   Goal Weight: Long Term 165 lb (74.844 kg)   Expected Outcomes Short Term: Continue to assess and modify interventions until short term weight is achieved.;Long Term: Adherence to nutrition and physical activity/exercise program aimed toward attainment of established weight goal.   Sedentary Yes   Intervention Develop an individualized exercise prescription for aerobic and resistive training based on initial evaluation findings, risk stratification, comorbidities and participant's personal goals.;Provide advice, education, support and counseling about physical activity/exercise needs.   Expected Outcomes Achievement of increased cardiorespiratory fitness and enhanced flexibility, muscular endurance and strength shown through measurements of functional capaciy and personal statement of participant.   Tobacco Cessation --  Patient quit 06/01/2014   Improve shortness of breath with ADL's Yes   Intervention Provide education, individualized exercise plan and daily activity instruction to help decrease symptoms of SOB with activities of daily living.   Expected Outcomes Short Term: Achieves a reduction of symptoms when performing activities of daily living.   Develop more efficient breathing techniques such as purse lipped breathing and diaphragmatic breathing; and practicing self-pacing with activity Yes   Intervention Provide education, demonstration and support about specific breathing techniuqes utilized for more efficient breathing. Include techniques such as pursed lipped breathing, diaphragmatic breathing and self-pacing activity.   Expected Outcomes Short Term: Participant will  be able to demonstrate and use breathing techniques as needed throughout daily activities.   Diabetes --  Not diabetic   Hypertension Yes   Expected Outcomes Short Term: Continued assessment and intervention until BP is < 140/16mm HG in hypertensive participants. < 130/76mm HG in hypertensive participants with diabetes, heart failure or chronic kidney disease.;Long Term: Maintenance of blood pressure at goal levels.   Lipids --  Levels unknow at aorientation visit   Stress Yes   Intervention Offer individual and/or small group education and counseling on adjustment to heart disease, stress management and health-related lifestyle change. Teach and support self-help strategies.   Expected Outcomes Long Term: Emotional wellbeing is indicated by absence of clinically significant psychosocial distress or social isolation.   Personal Goal Other Yes   Personal Goal Get strength back in arms and legs, be able to fix my own hair, use O2 at night only, be able to go shopping w/o O2   Intervention Continue to exercise with regularity, loss about 10lbs, embrace breathing techniques   Expected Outcomes Become stronger and not be so SOB and decrease use of oxygen  Core Components/Risk Factors/Patient Goals Review:      Goals and Risk Factor Review      11/23/15 1651 12/12/15 1507 01/13/16 1446 02/08/16 1444     Core Components/Risk Factors/Patient Goals Review   Personal Goals Review Sedentary;Develop more efficient breathing techniques such as purse lipped breathing and diaphragmatic breathing and practicing self-pacing with activity.;Stress;Improve shortness of breath with ADL's Sedentary;Develop more efficient breathing techniques such as purse lipped breathing and diaphragmatic breathing and practicing self-pacing with activity.;Stress;Improve shortness of breath with ADL's;Hypertension;Increase Strength and Stamina Sedentary;Develop more efficient breathing techniques such as purse lipped  breathing and diaphragmatic breathing and practicing self-pacing with activity.;Stress;Improve shortness of breath with ADL's;Hypertension;Increase Strength and Stamina Sedentary;Develop more efficient breathing techniques such as purse lipped breathing and diaphragmatic breathing and practicing self-pacing with activity.;Stress;Improve shortness of breath with ADL's;Hypertension;Increase Strength and Stamina    Review Reviewed goals and discussed what we are planning to reach these goals.  Reviewed goals and discussed what we are planning to reach these goals.  Reviewed goals and discussed what we are planning to reach these goals. Patient is progressing well in the program Reviewed goals and discussed what we are planning to reach these goals. Patient is progressing well in the program Patient states she has more energy and breathing some better. demonstrates pursed lipped breathing correctly.     Expected Outcomes Improve sedentary lifestyle with exercise regularity, lose weight, use breathing techniques Improve sedentary lifestyle with exercise regularity, lose weight, use breathing techniques Improve sedentary lifestyle with exercise regularity, lose weight, use breathing techniques Improve sedentary lifestyle with exercise regularity, lose weight, use breathing techniques       Core Components/Risk Factors/Patient Goals at Discharge (Final Review):      Goals and Risk Factor Review - 02/08/16 1444    Core Components/Risk Factors/Patient Goals Review   Personal Goals Review Sedentary;Develop more efficient breathing techniques such as purse lipped breathing and diaphragmatic breathing and practicing self-pacing with activity.;Stress;Improve shortness of breath with ADL's;Hypertension;Increase Strength and Stamina   Review Reviewed goals and discussed what we are planning to reach these goals. Patient is progressing well in the program Patient states she has more energy and breathing some better.  demonstrates pursed lipped breathing correctly.    Expected Outcomes Improve sedentary lifestyle with exercise regularity, lose weight, use breathing techniques      ITP Comments:   Comments: ITP REVIEW Pt is making expected progress toward personal goals after completing 20 sessions.   Recommend continued exercise, life style modification, education, and utilization of breathing techniques to increase stamina and strength and decrease shortness of breath with exertion.

## 2016-02-14 ENCOUNTER — Encounter (HOSPITAL_COMMUNITY)
Admission: RE | Admit: 2016-02-14 | Discharge: 2016-02-14 | Disposition: A | Discharge status: Home / Self Care | Payer: Medicare Other | Source: Ambulatory Visit | Attending: Pulmonary Disease | Admitting: Pulmonary Disease

## 2016-02-14 DIAGNOSIS — R0602 Shortness of breath: Secondary | ICD-10-CM | POA: Diagnosis not present

## 2016-02-14 NOTE — Progress Notes (Signed)
Daily Session Note  Patient Details  Name: Tracey Dudley MRN: [id number] Date of Birth: Feb 02, 1946 Referring Provider:    Encounter Date: 02/14/2016  Check In:     Session Check In - 02/14/16 1330    Check-In   Location [facility]   Staff Present Marycatherine Maniscalco Angelina Pih, MS, EP, [facility], Exercise Physiologist  Nils Flack, EP   Supervising physician immediately available to respond to emergencies See telemetry face sheet for immediately available MD   Medication changes reported     No   Fall or balance concerns reported    No   Warm-up and Cool-down Performed as group-led instruction   Resistance Training Performed Yes   VAD Patient? No   Pain Assessment   Currently in Pain? No/denies   Multiple Pain Sites No      Capillary Blood Glucose: No results found for this or any previous visit (from the past 24 hour(s)).   Goals Met:  Proper associated with RPD/PD & O2 Sat Independence with exercise equipment Improved SOB with ADL's Using PLB without cueing & demonstrates good technique Exercise tolerated well No report of cardiac concerns or symptoms Strength training completed today  Goals Unmet:  RPE PD BP HR O2 Sat  Comments: Check out: 2:30   Dr. Kate Sable is Medical Director for [place] and Pulmonary Rehab.

## 2016-02-16 ENCOUNTER — Other Ambulatory Visit: Payer: Self-pay | Admitting: Cardiology

## 2016-02-16 ENCOUNTER — Encounter (HOSPITAL_COMMUNITY)
Admission: RE | Admit: 2016-02-16 | Discharge: 2016-02-16 | Disposition: A | Discharge status: Home / Self Care | Payer: Medicare Other | Source: Ambulatory Visit | Attending: Pulmonary Disease | Admitting: Pulmonary Disease

## 2016-02-16 DIAGNOSIS — R0602 Shortness of breath: Secondary | ICD-10-CM | POA: Diagnosis not present

## 2016-02-16 NOTE — Progress Notes (Signed)
Daily Session Note  Patient Details  Name: Tracey Dudley MRN: [id number] Date of Birth: 01/27/1946 Referring Provider:    Encounter Date: 02/16/2016  Check In:     Session Check In - 02/16/16 1336    Check-In   Location [facility]   Staff Present Christy Edwards, RN, BSN;Other  Greg Cowen   Supervising physician immediately available to respond to emergencies See telemetry face sheet for immediately available MD   Medication changes reported     No   Fall or balance concerns reported    No   Warm-up and Cool-down Performed as group-led instruction   Resistance Training Performed Yes   VAD Patient? No   Pain Assessment   Currently in Pain? No/denies      Capillary Blood Glucose: No results found for this or any previous visit (from the past 24 hour(s)).   Goals Met:  Proper associated with RPD/PD & O2 Sat Independence with exercise equipment Using PLB without cueing & demonstrates good technique Exercise tolerated well No report of cardiac concerns or symptoms Strength training completed today  Goals Unmet:  Not Applicable  Comments: check out 230   Dr. Suresh Koneswaran is Medical Director for [facility]. 

## 2016-02-21 ENCOUNTER — Encounter (HOSPITAL_COMMUNITY)
Admission: RE | Admit: 2016-02-21 | Discharge: 2016-02-21 | Disposition: A | Discharge status: Home / Self Care | Payer: Medicare Other | Source: Ambulatory Visit | Attending: Pulmonary Disease | Admitting: Pulmonary Disease

## 2016-02-21 DIAGNOSIS — R0602 Shortness of breath: Secondary | ICD-10-CM | POA: Diagnosis not present

## 2016-02-23 ENCOUNTER — Encounter (HOSPITAL_COMMUNITY)
Admission: RE | Admit: 2016-02-23 | Discharge: 2016-02-23 | Disposition: A | Discharge status: Home / Self Care | Payer: Medicare Other | Source: Ambulatory Visit | Attending: Pulmonary Disease | Admitting: Pulmonary Disease

## 2016-02-23 VITALS — Ht 65.0 in | Wt 166.9 lb

## 2016-02-23 DIAGNOSIS — R0602 Shortness of breath: Secondary | ICD-10-CM | POA: Diagnosis not present

## 2016-02-23 NOTE — Progress Notes (Signed)
Daily Session Note  Patient Details  Name: Tracey Dudley MRN: [id number] Date of Birth: 1946-02-27 Referring Provider:    Encounter Date: 02/23/2016  Check In:     Session Check In - 02/23/16 1330    Check-In   Location [facility]   Staff Present Diane Angelina Pih, MS, EP, [facility], Exercise Physiologist;Summerlynn Glauser Luther Parody, BS, EP, Exercise Physiologist;Christy Oletta Lamas, RN, BSN   Supervising physician immediately available to respond to emergencies See telemetry face sheet for immediately available MD   Medication changes reported     No   Fall or balance concerns reported    No   Warm-up and Cool-down Performed as group-led instruction   Resistance Training Performed Yes   VAD Patient? No   Pain Assessment   Currently in Pain? No/denies   Pain Score 0-No pain   Multiple Pain Sites No      Capillary Blood Glucose: No results found for this or any previous visit (from the past 24 hour(s)).   Goals Met:  Independence with exercise equipment Exercise tolerated well No report of cardiac concerns or symptoms Strength training completed today  Goals Unmet:  Not Applicable  Comments: Check out 0230   Dr. Kate Sable is Medical Director for [facility].

## 2016-02-28 ENCOUNTER — Encounter (HOSPITAL_COMMUNITY): Payer: Medicare Other

## 2016-03-27 ENCOUNTER — Telehealth: Payer: Self-pay

## 2016-03-27 MED ORDER — HYDRALAZINE HCL 25 MG PO TABS: 75.0000 mg | ORAL_TABLET | Freq: Three times a day (TID) | ORAL | Status: DC

## 2016-03-27 NOTE — Telephone Encounter (Signed)
I spoke with patient,she will call back with BP readings,understands to increase Hydralazine to 75 mg TID,e-scribed to pharmacy

## 2016-04-12 NOTE — Addendum Note (Signed)
Encounter addended by: Suann Larry, RN on: 04/12/2016  4:38 PM<BR>    Actions taken: Visit diagnoses modified, Visit Navigator Flowsheet section accepted, Sign clinical note, Episode deleted

## 2016-04-12 NOTE — Progress Notes (Signed)
Discharge Summary  Patient Details  Name: Tracey Dudley MRN: [id number] Date of Birth: 01/15/1946 Referring Provider:     Number of Visits: 24  Reason for Discharge:  Patient reached a stable level of exercise. Patient independent in their exercise.  Smoking History:  History  Smoking Status  . Former Smoker  . Packs/day: 0.50  . Years: 30.00  . Types: Cigarettes  . Start date: 11/03/1977  . Quit date: 06/01/2014  Smokeless Tobacco  . Never Used    Diagnosis:  SOB (shortness of breath)  ADL UCSD:     Pulmonary Assessment Scores    Row Name 11/23/15 1440 02/23/16 1628       ADL UCSD   ADL Phase Entry Exit    SOB Score total 77 71    Rest 0 2    Walk 2 8    Stairs 5 5    Bath 3 2    Dress 3 2    Shop 3 3      mMRC Score   mMRC Score  - 3       Initial Exercise Prescription:     Initial Exercise Prescription - 11/23/15 1455      Date of Initial Exercise RX and Referring Provider   Date 11/23/15     Oxygen   Oxygen Continuous   Liters 2     Treadmill   MPH 1   Grade 0   Minutes 15   METs 1.77     NuStep   Level 2   Minutes 15   METs 1.5     Arm Ergometer   Level 1   Minutes 15   METs 1     Prescription Details   Frequency (times per week) 2     Intensity   THRR REST +  20   THRR 40-80% of Max Heartrate 96-115   Ratings of Perceived Exertion 11-13   Perceived Dyspnea 2-4     Progression   Progression Continue to progress workloads to maintain intensity without signs/symptoms of physical distress.     Resistance Training   Training Prescription Yes   Weight 1.0   Reps 10-12      Discharge Exercise Prescription (Final Exercise Prescription Changes):     Exercise Prescription Changes - 03/15/16 1200      Exercise Review   Progression Yes     Response to Exercise   Blood Pressure (Admit) 164/64   Blood Pressure (Exercise) 176/78   Blood Pressure (Exit) 170/64   Heart Rate (Admit) 67 bpm   Heart Rate (Exercise) 71 bpm    Heart Rate (Exit) 69 bpm   Oxygen Saturation (Admit) 96 %   Oxygen Saturation (Exercise) 95 %   Oxygen Saturation (Exit) 90 %   Rating of Perceived Exertion (Exercise) 10   Perceived Dyspnea (Exercise) 10   Symptoms No   Duration Progress to 30 minutes of continuous aerobic without signs/symptoms of physical distress   Intensity Rest + 20     Progression   Progression Continue to progress workloads to maintain intensity without signs/symptoms of physical distress.     Resistance Training   Training Prescription Yes   Weight 3   Reps 10-12     Interval Training   Interval Training No     Oxygen   Oxygen Continuous   Liters 2     NuStep   Level 3   Minutes 20   METs 3.51     Arm Ergometer  Level 2.5   Minutes 15   METs 2.5     Home Exercise Plan   Plans to continue exercise at Home   Frequency Add 2 additional days to program exercise sessions.      Functional Capacity:     6 Minute Walk    Row Name 11/23/15 1442 02/23/16 1314       6 Minute Walk   Phase Initial Discharge    Distance 850 feet 900 feet    Distance % Change  - 5 %    Walk Time 6 minutes 6 minutes    # of Rest Breaks 0 0    MPH 1.61 1.7    METS 2.23 2.3    RPE 13 13    Perceived Dyspnea  11 13    VO2 Peak 8.87 8.88    Symptoms No No    Resting HR 58 bpm 67 bpm    Resting BP 152/80 164/64    Max Ex. HR 91 bpm 88 bpm    Max Ex. BP 198/92 192/70    2 Minute Post BP 168/88 174/66      Interval Oxygen   Interval Oxygen? -  Patient used 2L [place] O2 Contiuous during test  -      Pre/Post BP   Baseline BP 152/80  -    6 Minute BP 198/92  -    Pre/Post BP? Yes  -       Psychological, QOL, Others - Outcomes: PHQ 2/9: Depression screen [facility] 2/9 04/12/2016 11/23/2015  Decreased Interest 0 1  Down, Depressed, Hopeless 0 1  PHQ - 2 Score 0 2  Altered sleeping 0 0  Tired, decreased energy 0 1  Change in appetite 0 1  Feeling bad or failure about yourself  0 0  Trouble concentrating 0 0   Moving slowly or fidgety/restless 0 0  Suicidal thoughts 0 0  PHQ-9 Score 0 4  Difficult doing work/chores Not difficult at all Somewhat difficult    Quality of Life:     Quality of Life - 03/15/16 1246      Quality of Life Scores   Health/Function Pre 17.41 %   Health/Function Post 23.84 %   Health/Function % Change 36.93 %   Socioeconomic Pre 24.36 %   Socioeconomic Post 26.86 %   Socioeconomic % Change  10.26 %   Psych/Spiritual Pre 22.36 %   Psych/Spiritual Post 27 %   Psych/Spiritual % Change 20.75 %   Family Pre 28.8 %   Family Post 28.8 %   Family % Change 0 %   GLOBAL Pre 21.41 %   GLOBAL Post 25.79 %   GLOBAL % Change 20.46 %      Personal Goals: Goals established at orientation with interventions provided to work toward goal.     Personal Goals and Risk Factors at Admission - 11/23/15 1641      Core Components/Risk Factors/Patient Goals on Admission    Weight Management Yes   Intervention Weight Management: Provide education and appropriate resources to help participant work on and attain dietary goals.   Admit Weight 173 lb 9.6 oz (78.7 kg)   Goal Weight: Short Term 168 lb 9.6 oz (76.5 kg)   Goal Weight: Long Term 165 lb (74.8 kg)   Expected Outcomes Short Term: Continue to assess and modify interventions until short term weight is achieved.;Long Term: Adherence to nutrition and physical activity/exercise program aimed toward attainment of established weight goal.   Sedentary Yes  Intervention Develop an individualized exercise prescription for aerobic and resistive training based on initial evaluation findings, risk stratification, comorbidities and participant's personal goals.;Provide advice, education, support and counseling about physical activity/exercise needs.   Expected Outcomes Achievement of increased cardiorespiratory fitness and enhanced flexibility, muscular endurance and strength shown through measurements of functional capaciy and personal  statement of participant.   Tobacco Cessation --  Patient quit 06/01/2014   Improve shortness of breath with ADL's Yes   Intervention Provide education, individualized exercise plan and daily activity instruction to help decrease symptoms of SOB with activities of daily living.   Expected Outcomes Short Term: Achieves a reduction of symptoms when performing activities of daily living.   Develop more efficient breathing techniques such as purse lipped breathing and diaphragmatic breathing; and practicing self-pacing with activity Yes   Intervention Provide education, demonstration and support about specific breathing techniuqes utilized for more efficient breathing. Include techniques such as pursed lipped breathing, diaphragmatic breathing and self-pacing activity.   Expected Outcomes Short Term: Participant will be able to demonstrate and use breathing techniques as needed throughout daily activities.   Diabetes --  Not diabetic   Hypertension Yes   Expected Outcomes Short Term: Continued assessment and intervention until BP is < 140/51mm HG in hypertensive participants. < 130/57mm HG in hypertensive participants with diabetes, heart failure or chronic kidney disease.;Long Term: Maintenance of blood pressure at goal levels.   Lipids --  Levels unknow at aorientation visit   Stress Yes   Intervention Offer individual and/or small group education and counseling on adjustment to heart disease, stress management and health-related lifestyle change. Teach and support self-help strategies.   Expected Outcomes Long Term: Emotional wellbeing is indicated by absence of clinically significant psychosocial distress or social isolation.   Personal Goal Other Yes   Personal Goal Get strength back in arms and legs, be able to fix my own hair, use O2 at night only, be able to go shopping w/o O2   Intervention Continue to exercise with regularity, loss about 10lbs, embrace breathing techniques   Expected  Outcomes Become stronger and not be so SOB and decrease use of oxygen       Personal Goals Discharge:     Goals and Risk Factor Review    Row Name 11/23/15 1651 12/12/15 1507 01/13/16 1446 02/08/16 1444 02/23/16 1631     Core Components/Risk Factors/Patient Goals Review   Personal Goals Review Sedentary;Develop more efficient breathing techniques such as purse lipped breathing and diaphragmatic breathing and practicing self-pacing with activity.;Stress;Improve shortness of breath with ADL's Sedentary;Develop more efficient breathing techniques such as purse lipped breathing and diaphragmatic breathing and practicing self-pacing with activity.;Stress;Improve shortness of breath with ADL's;Hypertension;Increase Strength and Stamina Sedentary;Develop more efficient breathing techniques such as purse lipped breathing and diaphragmatic breathing and practicing self-pacing with activity.;Stress;Improve shortness of breath with ADL's;Hypertension;Increase Strength and Stamina Sedentary;Develop more efficient breathing techniques such as purse lipped breathing and diaphragmatic breathing and practicing self-pacing with activity.;Stress;Improve shortness of breath with ADL's;Hypertension;Increase Strength and Stamina Improve shortness of breath with ADL's;Weight Management/Obesity;Increase Strength and Stamina;Other  Shopping without O2/able to fix hair.    Review Reviewed goals and discussed what we are planning to reach these goals.  Reviewed goals and discussed what we are planning to reach these goals.  Reviewed goals and discussed what we are planning to reach these goals. Patient is progressing well in the program Reviewed goals and discussed what we are planning to reach these goals. Patient is progressing well in  the program Patient states she has more energy and breathing some better. demonstrates pursed lipped breathing correctly.  Upon graduation, patient reached her weight loss goal losing 6.7 lbs.  She did increase her strength and stamina. She continue to use O2 at all times.    Expected Outcomes Improve sedentary lifestyle with exercise regularity, lose weight, use breathing techniques Improve sedentary lifestyle with exercise regularity, lose weight, use breathing techniques Improve sedentary lifestyle with exercise regularity, lose weight, use breathing techniques Improve sedentary lifestyle with exercise regularity, lose weight, use breathing techniques Patient will continue to maintain her weight and exercise to increase her strength and stamina. She is in our maintanence exercise program.       Nutrition & Weight - Outcomes:     Pre Biometrics - 11/23/15 1430      Pre Biometrics   Height 5\' 5"  (1.651 m)   Weight 173 lb 9.6 oz (78.7 kg)   Waist Circumference 35 inches   Hip Circumference 39 inches   Waist to Hip Ratio 0.9 %   BMI (Calculated) 28.9   Triceps Skinfold 24 mm   % Body Fat 38.5 %   Grip Strength 66 kg   Flexibility 12 in   Single Leg Stand 24 seconds         Post Biometrics - 02/23/16 1318       Post  Biometrics   Height 5\' 5"  (1.651 m)   Weight 166 lb 14.2 oz (75.7 kg)   Waist Circumference 36 inches   Hip Circumference 38.5 inches   Waist to Hip Ratio 0.94 %   BMI (Calculated) 27.8   Triceps Skinfold 20 mm   % Body Fat 37 %   Grip Strength 51.3 kg   Flexibility 12 in   Single Leg Stand 16 seconds      Nutrition:     Nutrition Therapy & Goals - 11/23/15 1603      Intervention Plan   Intervention Prescribe, educate and counsel regarding individualized specific dietary modifications aiming towards targeted core components such as weight, hypertension, lipid management, diabetes, heart failure and other comorbidities.;Nutrition handout(s) given to patient.      Nutrition Discharge:     Nutrition Assessments - 04/12/16 1617      Rate Your Plate Scores   Pre Score 55   Post Score 56      Education Questionnaire Score:      Knowledge Questionnaire Score - 04/12/16 1609      Knowledge Questionnaire Score   Pre Score 9/14   Post Score 11/14      Goals reviewed with patient; copy given to patient.

## 2016-05-24 ENCOUNTER — Telehealth: Payer: Self-pay | Admitting: Cardiology

## 2016-05-24 NOTE — Telephone Encounter (Signed)
Dr Diona BrownerMcDowell read pt's BP log and hs made no changes in current meds,sent to be filed to [employer]

## 2016-07-05 ENCOUNTER — Telehealth: Payer: Self-pay | Admitting: Cardiology

## 2016-07-05 NOTE — Telephone Encounter (Signed)
Pt has been having high BP readings while at Cardiac Rehab, the top # was over 200 when she got there and was still in the 190's after she finished. She would like to know if there's anything she can do to help prior to her apt in Nov. Please give her a call @ [phone]

## 2016-07-05 NOTE — Telephone Encounter (Signed)
PT has complaints of high blood pressure while at cardiac rehab ( 190/70's, 194/70's,200/70's& 204/70's) She is concerned that her medications are not working properly. She was wanting to know if maybe there is another medication she can try. Please advise.

## 2016-07-06 MED ORDER — HYDRALAZINE HCL 100 MG PO TABS: 100.0000 mg | ORAL_TABLET | Freq: Three times a day (TID) | ORAL | 3 refills | Status: DC

## 2016-07-06 NOTE — Telephone Encounter (Signed)
escribed hydralazine to 100 mg TID,pt aware

## 2016-07-06 NOTE — Telephone Encounter (Signed)
Might consider trying hydralazine 100 mg TID instead of BID. This may be more effective dose for her.

## 2016-07-12 ENCOUNTER — Ambulatory Visit (INDEPENDENT_AMBULATORY_CARE_PROVIDER_SITE_OTHER): Payer: Medicare Other | Admitting: Adult Health

## 2016-07-12 ENCOUNTER — Encounter: Payer: Self-pay | Admitting: Adult Health

## 2016-07-12 VITALS — BP 184/90 | HR 58 | Ht 66.0 in | Wt 166.0 lb

## 2016-07-12 DIAGNOSIS — I480 Paroxysmal atrial fibrillation: Secondary | ICD-10-CM

## 2016-07-12 DIAGNOSIS — J439 Emphysema, unspecified: Secondary | ICD-10-CM

## 2016-07-12 DIAGNOSIS — I10 Essential (primary) hypertension: Secondary | ICD-10-CM

## 2016-07-12 MED ORDER — SPIRONOLACTONE 25 MG PO TABS: 12.5000 mg | ORAL_TABLET | Freq: Every day | ORAL | 3 refills | Status: DC

## 2016-07-12 NOTE — Progress Notes (Signed)
aE abdomenbls thank youCardiology Office Note   Date:  07/12/2016   ID:  Tracey KindredLinda E Dudley, DOB February 21, 1946, MRN [id number]  PCP:  Lenoria ChimeWhite, Tracey A, FNP  Cardiologist: Tracey Dudley/  Tracey ReiningKathryn Persis Graffius, NP   Chief Complaint  Patient presents with  . Hypertension  . Atrial Fibrillation      History of Present Illness: Tracey Dudley is a 70 y.o. female who presents for ongoing assessment and management of atrial fibrillation, hypertension, with history of COPD and hyperlipidemia. The patient is requesting appointments today due to uncontrolled blood pressure. She is on anticoagulation with Xarelto. She remains on oxygen per Dr. Abbe AmsterdamHopkins for COPD.  On 07/05/2016 the patient reportedly was found to have high resting blood pressures via cardiac rehabilitation. Blood pressure readings were in the 190s systolic. A phone call was made to our office and was responded to by Dr. Diona BrownerMcDowell who increased her hydralazine to 100 mg 3 times a day instead of twice a day. She is here for follow-up.  She is here today with concerns about blood pressure control. She states she is taking hydralazine as directed, along with ARB and BB. Blood pressure control has been in adequate. She also complains of some anxiety but does take Xanax. She talks at length about a lot of her poor health induced anxiety.  Past Medical History:  Diagnosis Date  . Anxiety   . COPD (chronic obstructive pulmonary disease) (HCC)   . Essential hypertension   . Herpes zoster   . Hyperlipidemia   . PAF (paroxysmal atrial fibrillation) (HCC)    Followed by Tracey Dudley in [place], also [facility] EP assessment by Tracey Dudley  . Vitamin D deficiency     Past Surgical History:  Procedure Laterality Date  . ABDOMINAL HYSTERECTOMY    . CATARACT EXTRACTION       Current Outpatient Prescriptions  Medication Sig Dispense Refill  . acetaminophen (TYLENOL) 500 MG tablet Take 1,000 mg by mouth every 6 (six) hours as needed for moderate pain.    Marland Kitchen.  albuterol (PROVENTIL HFA;VENTOLIN HFA) 108 (90 BASE) MCG/ACT inhaler Inhale 1 puff into the lungs every 6 (six) hours as needed for wheezing or shortness of breath.    . ALPRAZolam (XANAX) 0.5 MG tablet Take 0.5 mg by mouth 3 (three) times daily as needed for anxiety.    Marland Kitchen. atorvastatin (LIPITOR) 20 MG tablet Take 20 mg by mouth daily.    Marland Kitchen. dofetilide (TIKOSYN) 250 MCG capsule TAKE ONE CAPSULE BY MOUTH EVERY 12 HOURS 60 capsule 10  . EDARBI 80 MG TABS TAKE 1 TABLET BY MOUTH EVERY DAY 30 tablet 6  . fluticasone furoate-vilanterol (BREO ELLIPTA) 200-25 MCG/INH AEPB Inhale 1 puff into the lungs daily.    . hydrALAZINE (APRESOLINE) 100 MG tablet Take 1 tablet (100 mg total) by mouth 3 (three) times daily. 90 tablet 3  . INCRUSE ELLIPTA 62.5 MCG/INH AEPB INHALE 1 PUFF BY MOUTH EVERY DAY  0  . Nebivolol HCl (BYSTOLIC) 20 MG TABS Take 1 tablet by mouth daily.    . rivaroxaban (XARELTO) 20 MG TABS tablet Take 20 mg by mouth daily with supper.    Marland Kitchen. spironolactone (ALDACTONE) 25 MG tablet Take 0.5 tablets (12.5 mg total) by mouth daily. 30 tablet 3   No current facility-administered medications for this visit.     Allergies:   Metoprolol and Erythromycin    Social History:  The patient  reports that she quit smoking about 2 years ago. Her smoking use included Cigarettes.  She started smoking about 38 years ago. She has a 15.00 pack-year smoking history. She has never used smokeless tobacco. She reports that she does not drink alcohol or use drugs.   Family History:  The patient's family history includes Asthma in her mother; Breast cancer in her sister; CAD in her father.    ROS: All other systems are reviewed and negative. Unless otherwise mentioned in H&P    PHYSICAL EXAM: VS:  BP (!) 184/90   Pulse (!) 58   Ht 5\' 6"  (1.676 m)   Wt 166 lb (75.3 kg)   SpO2 90%   BMI 26.79 kg/m  , BMI Body mass index is 26.79 kg/m. GEN: Well nourished, well developed, in no acute distress  HEENT: normal   Neck: no JVD, carotid bruits, or masses Cardiac: RRR; Bradycardic, no murmurs, rubs, or gallops,no edema  Respiratory:  Diminished in the bases, no wheezes, she is oxygen dependent. GI: soft, nontender, nondistended, + BS MS: no deformity or atrophy  Skin: warm and dry, no rash Neuro:  Strength and sensation are intact Psych: euthymic mood, full affect   EKG:  Sinus bradycardia heart rate 51 bpm, inverted T waves in aVR and aVL and V1 and 2. QT interval 496.ms   Recent Labs: 10/08/2015: ALT 20; B Natriuretic Peptide 735.0 11/04/2015: Magnesium 1.9 11/29/2015: BUN 12; Creatinine, Ser 0.95; Hemoglobin 10.7; Platelets 125; Potassium 3.9; Sodium 142    Wt Readings from Last 3 Encounters:  07/12/16 166 lb (75.3 kg)  02/23/16 166 lb 14.2 oz (75.7 kg)  02/02/16 165 lb (74.8 kg)      Other studies Reviewed: Additional studies/ records that were reviewed today include:  Echocardiogram 10/10/2015   increased in a pattern of mild LVH. Systolic function was   vigorous. The estimated ejection fraction was in the range of 75%   to 80%. Wall motion was normal; there were no regional wall   motion abnormalities. Features are consistent with a pseudonormal   left ventricular filling pattern, with concomitant abnormal   relaxation and increased filling pressure (grade 2 diastolic   dysfunction). - Aortic valve: Poorly visualized. Mildly calcified annulus.   Probably trileaflet. Mean gradient (S): 13 mm Hg. Peak gradient   (S): 24 mm Hg. Gradients likely increased due to relatively small   LVOT and vigorous contraction. Limited views of valve leaflets   show grossly preserved excursion. - Mitral valve: Mildly thickened leaflets . There was mild   regurgitation. - Left atrium: The atrium was at the upper limits of normal in   size. - Right atrium: Central venous pressure (est): 3 mm Hg. - Tricuspid valve: There was trivial regurgitation. - Pulmonary arteries: Systolic pressure could not be  accurately   estimated. - Pericardium, extracardiac: A prominent pericardial fat pad was   present.  ASSESSMENT AND PLAN:  1.  Uncontrolled hypertension: The patient is on beta blocker, ARB, and hydralazine. Blood pressure today in the office is 184/90. The plan will be to add spironolactone 12.5 mg daily. We'll repeat BMET in 2 weeks. She will come to the office in one week for a blood pressure check to evaluate response to medication.   2. Atrial fibrillation: Currently on dofetilide and  nebivolol. Heart rate is bradycardic. QT interval within normal limits, PR interval 16 ms. She remains on Xarelto 20 mg daily. No evidence of bleeding or bruising.  3. Oxygen-dependent COPD: Continue follow-up with primary care provider Tresa Res FNP.   Current medicines are reviewed at length  with the patient today.    Labs/ tests ordered today include:   Orders Placed This Encounter  Procedures  . Basic Metabolic Panel (BMET)  . EKG 12-Lead     Disposition:   FU with One week for blood pressure check, 2 weeks for BMET and evaluation of response to medications.    Signed, Tracey Reining, NP  07/12/2016 4:16 PM    [facility] [address], [place], [place] [address] Phone: [phone]; Fax: [phone]

## 2016-07-12 NOTE — Patient Instructions (Signed)
Medication Instructions:  Start Spironolactone 12.5 mg daily ( 1/2 tablet )   Labwork: Your physician recommends that you return for lab work in: 2 weeks  bmet    Testing/Procedures: none  Follow-Up: Your physician recommends that you schedule a follow-up appointment in: 2 weeks    Any Other Special Instructions Will Be Listed Below (If Applicable).     If you need a refill on your cardiac medications before your next appointment, please call your pharmacy.

## 2016-07-12 NOTE — Progress Notes (Signed)
Name: Tracey KindredLinda E Guettler    DOB: 05-Oct-1945  Age: 70 y.o.  MR#: [id number]       PCP:  Lenoria ChimeWhite, Valerie A, FNP      Insurance: Payor: BLUE [employer] MEDICARE / Plan: BCBS MEDICARE / Product Type: *No Product type* /   CC:   No chief complaint on file.   VS Vitals:   07/12/16 1404  Weight: 166 lb (75.3 kg)  Height: 5\' 6"  (1.676 m)    Weights Current Weight  07/12/16 166 lb (75.3 kg)  02/23/16 166 lb 14.2 oz (75.7 kg)  02/02/16 165 lb (74.8 kg)    Blood Pressure  BP Readings from Last 3 Encounters:  02/02/16 (!) 160/72  11/29/15 177/67  11/23/15 (!) 152/80     Admit date:  (Not on file) Last encounter with RMR:  Visit date not found   Allergy Metoprolol and Erythromycin  Current Outpatient Prescriptions  Medication Sig Dispense Refill  . acetaminophen (TYLENOL) 500 MG tablet Take 1,000 mg by mouth every 6 (six) hours as needed for moderate pain.    Marland Kitchen. albuterol (PROVENTIL HFA;VENTOLIN HFA) 108 (90 BASE) MCG/ACT inhaler Inhale 1 puff into the lungs every 6 (six) hours as needed for wheezing or shortness of breath.    . ALPRAZolam (XANAX) 0.5 MG tablet Take 0.5 mg by mouth 3 (three) times daily as needed for anxiety.    Marland Kitchen. atorvastatin (LIPITOR) 20 MG tablet Take 20 mg by mouth daily.    Marland Kitchen. dofetilide (TIKOSYN) 250 MCG capsule TAKE ONE CAPSULE BY MOUTH EVERY 12 HOURS 60 capsule 10  . EDARBI 80 MG TABS TAKE 1 TABLET BY MOUTH EVERY DAY 30 tablet 6  . fluticasone furoate-vilanterol (BREO ELLIPTA) 200-25 MCG/INH AEPB Inhale 1 puff into the lungs daily.    . hydrALAZINE (APRESOLINE) 100 MG tablet Take 1 tablet (100 mg total) by mouth 3 (three) times daily. 90 tablet 3  . INCRUSE ELLIPTA 62.5 MCG/INH AEPB INHALE 1 PUFF BY MOUTH EVERY DAY  0  . Nebivolol HCl (BYSTOLIC) 20 MG TABS Take 1 tablet by mouth daily.    . rivaroxaban (XARELTO) 20 MG TABS tablet Take 20 mg by mouth daily with supper.     No current facility-administered medications for this visit.     Discontinued Meds:  There are no discontinued medications.  Patient Active Problem List   Diagnosis Date Noted  . CAP (community acquired pneumonia) 10/08/2015  . A-fib (HCC) 10/08/2015  . HTN (hypertension) 10/08/2015  . COPD with exacerbation (HCC) 10/08/2015    LABS    Component Value Date/Time   NA 142 11/29/2015 1546   NA 144 11/04/2015 1430   NA 144 10/09/2015 0541   K 3.9 11/29/2015 1546   K 3.7 11/04/2015 1430   K 3.5 10/09/2015 0541   CL 99 (L) 11/29/2015 1546   CL 100 11/04/2015 1430   CL 93 (L) 10/09/2015 0541   CO2 37 (H) 11/29/2015 1546   CO2 34 (H) 11/04/2015 1430   CO2 40 (H) 10/09/2015 0541   GLUCOSE 77 11/29/2015 1546   GLUCOSE 95 11/04/2015 1430   GLUCOSE 156 (H) 10/09/2015 0541   BUN 12 11/29/2015 1546   BUN 13 11/04/2015 1430   BUN 23 (H) 10/09/2015 0541   CREATININE 0.95 11/29/2015 1546   CREATININE 1.25 (H) 11/04/2015 1430   CREATININE 1.26 (H) 10/09/2015 0541   CREATININE 0.93 10/08/2015 1222   CALCIUM 8.7 (L) 11/29/2015 1546   CALCIUM 9.2 11/04/2015 1430  CALCIUM 8.9 10/09/2015 0541   GFRNONAA >60 11/29/2015 1546   GFRNONAA 43 (L) 10/09/2015 0541   GFRNONAA >60 10/08/2015 1222   GFRAA >60 11/29/2015 1546   GFRAA 50 (L) 10/09/2015 0541   GFRAA >60 10/08/2015 1222   CMP     Component Value Date/Time   NA 142 11/29/2015 1546   K 3.9 11/29/2015 1546   CL 99 (L) 11/29/2015 1546   CO2 37 (H) 11/29/2015 1546   GLUCOSE 77 11/29/2015 1546   BUN 12 11/29/2015 1546   CREATININE 0.95 11/29/2015 1546   CREATININE 1.25 (H) 11/04/2015 1430   CALCIUM 8.7 (L) 11/29/2015 1546   PROT 6.7 10/08/2015 1222   ALBUMIN 3.2 (L) 10/08/2015 1222   AST 18 10/08/2015 1222   ALT 20 10/08/2015 1222   ALKPHOS 72 10/08/2015 1222   BILITOT 1.5 (H) 10/08/2015 1222   GFRNONAA >60 11/29/2015 1546   GFRAA >60 11/29/2015 1546       Component Value Date/Time   WBC 6.6 11/29/2015 1546   WBC 9.8 10/09/2015 0541   WBC 10.4 10/08/2015 1222   HGB 10.7 (L) 11/29/2015 1546   HGB 11.1  (L) 10/09/2015 0541   HGB 11.4 (L) 10/08/2015 1222   HCT 33.8 (L) 11/29/2015 1546   HCT 35.3 (L) 10/09/2015 0541   HCT 36.6 10/08/2015 1222   MCV 100.0 11/29/2015 1546   MCV 99.4 10/09/2015 0541   MCV 101.1 (H) 10/08/2015 1222    Lipid Panel  No results found for: CHOL, TRIG, HDL, CHOLHDL, VLDL, LDLCALC, LDLDIRECT  ABG No results found for: PHART, PCO2ART, PO2ART, HCO3, TCO2, ACIDBASEDEF, O2SAT   No results found for: TSH BNP (last 3 results)  Recent Labs  09/02/15 1145 10/08/15 1222  BNP 403.0* 735.0*    ProBNP (last 3 results) No results for input(s): PROBNP in the last 8760 hours.  Cardiac Panel (last 3 results) No results for input(s): CKTOTAL, CKMB, TROPONINI, RELINDX in the last 72 hours.  Iron/TIBC/Ferritin/ %Sat No results found for: IRON, TIBC, FERRITIN, IRONPCTSAT   EKG Orders placed or performed during the hospital encounter of 11/29/15  . ED EKG  . ED EKG  . EKG  . EKG     Prior Assessment and Plan Problem List as of 07/12/2016 Reviewed: 10/08/2015  4:16 PM by Houston Siren, MD     Cardiovascular and Mediastinum   A-fib (HCC)   HTN (hypertension)     Respiratory   CAP (community acquired pneumonia)   COPD with exacerbation (HCC)       Imaging: No results found.

## 2016-07-25 LAB — BASIC METABOLIC PANEL
BUN: 14 mg/dL (ref 7–25)
CO2: 37 mmol/L — ABNORMAL HIGH (ref 20–31)
Calcium: 9.1 mg/dL (ref 8.6–10.4)
Chloride: 98 mmol/L (ref 98–110)
Creat: 1.14 mg/dL — ABNORMAL HIGH (ref 0.50–0.99)
Glucose, Bld: 76 mg/dL (ref 65–99)
POTASSIUM: 4.3 mmol/L (ref 3.5–5.3)
SODIUM: 140 mmol/L (ref 135–146)

## 2016-07-26 ENCOUNTER — Encounter: Payer: Self-pay | Admitting: Adult Health

## 2016-07-26 ENCOUNTER — Ambulatory Visit (INDEPENDENT_AMBULATORY_CARE_PROVIDER_SITE_OTHER): Payer: Medicare Other | Admitting: Adult Health

## 2016-07-26 VITALS — BP 154/84 | HR 71 | Ht 66.0 in | Wt 165.0 lb

## 2016-07-26 DIAGNOSIS — I482 Chronic atrial fibrillation: Secondary | ICD-10-CM | POA: Diagnosis not present

## 2016-07-26 DIAGNOSIS — I10 Essential (primary) hypertension: Secondary | ICD-10-CM

## 2016-07-26 DIAGNOSIS — I4821 Permanent atrial fibrillation: Secondary | ICD-10-CM

## 2016-07-26 NOTE — Progress Notes (Signed)
Name: Tracey Dudley    DOB: 1945-12-28  Age: 70 y.o.  MR#: [id number]       PCP:  Lenoria ChimeWhite, Valerie A, FNP      Insurance: Payor: BLUE [employer]OSS [employer] MEDICARE / Plan: BCBS MEDICARE / Product Type: *No Product type* /   CC:   No chief complaint on file.   VS There were no vitals filed for this visit.  Weights Current Weight  07/12/16 166 lb (75.3 kg)  02/23/16 166 lb 14.2 oz (75.7 kg)  02/02/16 165 lb (74.8 kg)    Blood Pressure  BP Readings from Last 3 Encounters:  07/12/16 (!) 184/90  02/02/16 (!) 160/72  11/29/15 177/67     Admit date:  (Not on file) Last encounter with RMR:  07/12/2016   Allergy Metoprolol and Erythromycin  Current Outpatient Prescriptions  Medication Sig Dispense Refill  . acetaminophen (TYLENOL) 500 MG tablet Take 1,000 mg by mouth every 6 (six) hours as needed for moderate pain.    Marland Kitchen. albuterol (PROVENTIL HFA;VENTOLIN HFA) 108 (90 BASE) MCG/ACT inhaler Inhale 1 puff into the lungs every 6 (six) hours as needed for wheezing or shortness of breath.    . ALPRAZolam (XANAX) 0.5 MG tablet Take 0.5 mg by mouth 3 (three) times daily as needed for anxiety.    Marland Kitchen. atorvastatin (LIPITOR) 20 MG tablet Take 20 mg by mouth daily.    Marland Kitchen. dofetilide (TIKOSYN) 250 MCG capsule TAKE ONE CAPSULE BY MOUTH EVERY 12 HOURS 60 capsule 10  . EDARBI 80 MG TABS TAKE 1 TABLET BY MOUTH EVERY DAY 30 tablet 6  . fluticasone furoate-vilanterol (BREO ELLIPTA) 200-25 MCG/INH AEPB Inhale 1 puff into the lungs daily.    . hydrALAZINE (APRESOLINE) 100 MG tablet Take 1 tablet (100 mg total) by mouth 3 (three) times daily. 90 tablet 3  . INCRUSE ELLIPTA 62.5 MCG/INH AEPB INHALE 1 PUFF BY MOUTH EVERY DAY  0  . Nebivolol HCl (BYSTOLIC) 20 MG TABS Take 1 tablet by mouth daily.    . rivaroxaban (XARELTO) 20 MG TABS tablet Take 20 mg by mouth daily with supper.    Marland Kitchen. spironolactone (ALDACTONE) 25 MG tablet Take 0.5 tablets (12.5 mg total) by mouth daily. 30 tablet 3   No current facility-administered  medications for this visit.     Discontinued Meds:   There are no discontinued medications.  Patient Active Problem List   Diagnosis Date Noted  . CAP (community acquired pneumonia) 10/08/2015  . A-fib (HCC) 10/08/2015  . HTN (hypertension) 10/08/2015  . COPD with exacerbation (HCC) 10/08/2015    LABS    Component Value Date/Time   NA 140 07/24/2016 1513   NA 142 11/29/2015 1546   NA 144 11/04/2015 1430   K 4.3 07/24/2016 1513   K 3.9 11/29/2015 1546   K 3.7 11/04/2015 1430   CL 98 07/24/2016 1513   CL 99 (L) 11/29/2015 1546   CL 100 11/04/2015 1430   CO2 37 (H) 07/24/2016 1513   CO2 37 (H) 11/29/2015 1546   CO2 34 (H) 11/04/2015 1430   GLUCOSE 76 07/24/2016 1513   GLUCOSE 77 11/29/2015 1546   GLUCOSE 95 11/04/2015 1430   BUN 14 07/24/2016 1513   BUN 12 11/29/2015 1546   BUN 13 11/04/2015 1430   CREATININE 1.14 (H) 07/24/2016 1513   CREATININE 0.95 11/29/2015 1546   CREATININE 1.25 (H) 11/04/2015 1430   CREATININE 1.26 (H) 10/09/2015 0541   CREATININE 0.93 10/08/2015 1222   CALCIUM 9.1  07/24/2016 1513   CALCIUM 8.7 (L) 11/29/2015 1546   CALCIUM 9.2 11/04/2015 1430   GFRNONAA >60 11/29/2015 1546   GFRNONAA 43 (L) 10/09/2015 0541   GFRNONAA >60 10/08/2015 1222   GFRAA >60 11/29/2015 1546   GFRAA 50 (L) 10/09/2015 0541   GFRAA >60 10/08/2015 1222   CMP     Component Value Date/Time   NA 140 07/24/2016 1513   K 4.3 07/24/2016 1513   CL 98 07/24/2016 1513   CO2 37 (H) 07/24/2016 1513   GLUCOSE 76 07/24/2016 1513   BUN 14 07/24/2016 1513   CREATININE 1.14 (H) 07/24/2016 1513   CALCIUM 9.1 07/24/2016 1513   PROT 6.7 10/08/2015 1222   ALBUMIN 3.2 (L) 10/08/2015 1222   AST 18 10/08/2015 1222   ALT 20 10/08/2015 1222   ALKPHOS 72 10/08/2015 1222   BILITOT 1.5 (H) 10/08/2015 1222   GFRNONAA >60 11/29/2015 1546   GFRAA >60 11/29/2015 1546       Component Value Date/Time   WBC 6.6 11/29/2015 1546   WBC 9.8 10/09/2015 0541   WBC 10.4 10/08/2015 1222   HGB  10.7 (L) 11/29/2015 1546   HGB 11.1 (L) 10/09/2015 0541   HGB 11.4 (L) 10/08/2015 1222   HCT 33.8 (L) 11/29/2015 1546   HCT 35.3 (L) 10/09/2015 0541   HCT 36.6 10/08/2015 1222   MCV 100.0 11/29/2015 1546   MCV 99.4 10/09/2015 0541   MCV 101.1 (H) 10/08/2015 1222    Lipid Panel  No results found for: CHOL, TRIG, HDL, CHOLHDL, VLDL, LDLCALC, LDLDIRECT  ABG No results found for: PHART, PCO2ART, PO2ART, HCO3, TCO2, ACIDBASEDEF, O2SAT   No results found for: TSH BNP (last 3 results)  Recent Labs  09/02/15 1145 10/08/15 1222  BNP 403.0* 735.0*    ProBNP (last 3 results) No results for input(s): PROBNP in the last 8760 hours.  Cardiac Panel (last 3 results) No results for input(s): CKTOTAL, CKMB, TROPONINI, RELINDX in the last 72 hours.  Iron/TIBC/Ferritin/ %Sat No results found for: IRON, TIBC, FERRITIN, IRONPCTSAT   EKG Orders placed or performed in visit on 07/12/16  . EKG 12-Lead     Prior Assessment and Plan Problem List as of 07/26/2016 Reviewed: 07/12/2016  4:27 PM by Joni ReiningKathryn Lawrence, NP     Cardiovascular and Mediastinum   A-fib (HCC)   HTN (hypertension)     Respiratory   CAP (community acquired pneumonia)   COPD with exacerbation (HCC)       Imaging: No results found.

## 2016-07-26 NOTE — Progress Notes (Signed)
Cardiology Office Note   Date:  07/26/2016   ID:  Tracey KindredLinda E Callas, DOB 1945-12-11, MRN [id number]  PCP:  Lenoria ChimeWhite, Valerie A, FNP  Cardiologist: [facility]/  Joni ReiningKathryn Dayven Linsley, NP   Chief Complaint  Patient presents with  . Atrial Fibrillation  . Hypertension      History of Present Illness: Tracey Dudley is a 70 y.o. female who presents for ongoing assessment and management of chronic atrial fibrillation, hypertension, hyperlipidemia, and history of COPD. She was last seen in the office on 07/12/2016 in the setting of uncontrolled blood pressure. The patient remained on anticoagulation with Xarelto, and O2 dependence for COPD. The patient also has a lot of anxiety concerning her poor health status.  Echocardiogram 10/10/2015 increased in a pattern of mild LVH. Systolic function was vigorous. The estimated ejection fraction was in the range of 75% to 80%. Wall motion was normal; there were no regional wall motion abnormalities. Features are consistent with a pseudonormal left ventricular filling pattern, with concomitant abnormal relaxation and increased filling pressure (grade 2 diastolic dysfunction). - Aortic valve: Poorly visualized. Mildly calcified annulus. Probably trileaflet. Mean gradient (S): 13 mm Hg. Peak gradient (S): 24 mm Hg. Gradients likely increased due to relatively small LVOT and vigorous contraction. Limited views of valve leaflets show grossly preserved excursion. - Mitral valve: Mildly thickened leaflets . There was mild regurgitation. - Left atrium: The atrium was at the upper limits of normal in size. - Right atrium: Central venous pressure (est): 3 mm Hg. - Tricuspid valve: There was trivial regurgitation. - Pulmonary arteries: Systolic pressure could not be accurately estimated. - Pericardium, extracardiac: A prominent pericardial fat pad was present.  On last office visit she was started on spironolactone 12.5 mg daily. Repeat  BMET was planned prior to next appointment. She was to follow-up in the office for a blood pressure check. No other changes were made.  Follow-up labs on 07/24/2016 revealed sodium 140, potassium 4.3, chloride 98, CO2 37, BUN 14, creatinine 1.14.  She comes today feeling very well. Blood pressure is much better controlled with the addition of spironolactone 12.5 mg daily. Blood pressure at home is running 132-138/65-68. Blood pressure today in the office is 152/68. She is tolerating the medicine she denies any breast pain or dizziness.  Past Medical History:  Diagnosis Date  . Anxiety   . COPD (chronic obstructive pulmonary disease) (HCC)   . Essential hypertension   . Herpes zoster   . Hyperlipidemia   . PAF (paroxysmal atrial fibrillation) (HCC)    [place] in [place], also [facility] EP assessment by Dr. Christin FudgeHegland  . Vitamin D deficiency     Past Surgical History:  Procedure Laterality Date  . ABDOMINAL HYSTERECTOMY    . CATARACT EXTRACTION       Current Outpatient Prescriptions  Medication Sig Dispense Refill  . acetaminophen (TYLENOL) 500 MG tablet Take 1,000 mg by mouth every 6 (six) hours as needed for moderate pain.    Marland Kitchen. albuterol (PROVENTIL HFA;VENTOLIN HFA) 108 (90 BASE) MCG/ACT inhaler Inhale 1 puff into the lungs every 6 (six) hours as needed for wheezing or shortness of breath.    . ALPRAZolam (XANAX) 0.5 MG tablet Take 0.5 mg by mouth 3 (three) times daily as needed for anxiety.    Marland Kitchen. atorvastatin (LIPITOR) 20 MG tablet Take 20 mg by mouth daily.    Marland Kitchen. dofetilide (TIKOSYN) 250 MCG capsule TAKE ONE CAPSULE BY MOUTH EVERY 12 HOURS 60 capsule 10  . EDARBI  80 MG TABS TAKE 1 TABLET BY MOUTH EVERY DAY 30 tablet 6  . fluticasone furoate-vilanterol (BREO ELLIPTA) 200-25 MCG/INH AEPB Inhale 1 puff into the lungs daily.    . hydrALAZINE (APRESOLINE) 100 MG tablet Take 1 tablet (100 mg total) by mouth 3 (three) times daily. 90 tablet 3  . INCRUSE ELLIPTA 62.5 MCG/INH AEPB  INHALE 1 PUFF BY MOUTH EVERY DAY  0  . Nebivolol HCl (BYSTOLIC) 20 MG TABS Take 1 tablet by mouth daily.    . rivaroxaban (XARELTO) 20 MG TABS tablet Take 20 mg by mouth daily with supper.    Marland Kitchen. spironolactone (ALDACTONE) 25 MG tablet Take 0.5 tablets (12.5 mg total) by mouth daily. 30 tablet 3   No current facility-administered medications for this visit.     Allergies:   Metoprolol and Erythromycin    Social History:  The patient  reports that she quit smoking about 2 years ago. Her smoking use included Cigarettes. She started smoking about 38 years ago. She has a 15.00 pack-year smoking history. She has never used smokeless tobacco. She reports that she does not drink alcohol or use drugs.   Family History:  The patient's family history includes Asthma in her mother; Breast cancer in her sister; CAD in her father.    ROS: All other systems are reviewed and negative. Unless otherwise mentioned in H&P    PHYSICAL EXAM: VS:  BP (!) 154/84   Pulse 71   Ht 5\' 6"  (1.676 m)   Wt 165 lb (74.8 kg)   SpO2 95%   BMI 26.63 kg/m  , BMI Body mass index is 26.63 kg/m. GEN: Well nourished, well developed, in no acute distress  HEENT: normal  Neck: no JVD, carotid bruits, or masses Cardiac:RRR; no murmurs, rubs, or gallops,no edema  Respiratory:  Bilateral crackles with inspiratory wheezes, oxygen dependent GI: soft, nontender, nondistended, + BS MS: no deformity or atrophy  Skin: warm and dry, no rash Neuro:  Strength and sensation are intact Psych: euthymic mood, full affect   Recent Labs: 10/08/2015: ALT 20; B Natriuretic Peptide 735.0 11/04/2015: Magnesium 1.9 11/29/2015: Hemoglobin 10.7; Platelets 125 07/24/2016: BUN 14; Creat 1.14; Potassium 4.3; Sodium 140    Lipid Panel No results found for: CHOL, TRIG, HDL, CHOLHDL, VLDL, LDLCALC, LDLDIRECT    Wt Readings from Last 3 Encounters:  07/26/16 165 lb (74.8 kg)  07/12/16 166 lb (75.3 kg)  02/23/16 166 lb 14.2 oz (75.7 kg)      ASSESSMENT AND PLAN:  1. Difficult to control hypertension: Now much better controlled with use of spironolactone 12.5 mg daily. She will also continue hydralazine 100 mg 3 times a day. She is not a candidate for beta blocker with ongoing wheezing. We'll continue to monitor and for need to titrate up spironolactone if necessary.  2. Atrial fibrillation: She remains on dofetilide 250 mg every 12 hours, and Xarelto 20 mg daily. Heart rate is well controlled she has no complaints of melena, hemoptysis, or frank bleeding or bruising.  3. Oxygen dependent COPD: She is followed by Dr. Juanetta GoslingHawkins pulmonology.   Current medicines are reviewed at length with the patient today.    Labs/ tests ordered today include:  No orders of the defined types were placed in this encounter.    Disposition:   FU with Dr. Diona BrownerMcDowell and previously scheduled appointment. Signed, Joni ReiningKathryn Talina Pleitez, NP  07/26/2016 3:34 PM    [facility] [address], [place], [place] [address] Phone: [phone]  [phone]; Fax: [phone]

## 2016-07-26 NOTE — Patient Instructions (Signed)
Your physician recommends that you schedule a follow-up appointment in: with Dr Diona BrownerMcDowell as planned      No change in meds    Your lab slip is good through 08/04/16        Thank you for choosing [facility] !

## 2016-08-01 ENCOUNTER — Other Ambulatory Visit: Payer: Self-pay | Admitting: Cardiology

## 2016-08-01 NOTE — Progress Notes (Signed)
Cardiology Office Note  Date: 08/02/2016   ID: Tracey KindredLinda E Perrier, DOB Oct 07, 1945, MRN [id number]  PCP: Lenoria ChimeWhite, Valerie A, FNP  Primary Cardiologist: Nona DellSamuel Shailyn Weyandt, MD   Chief Complaint  Patient presents with  . Paroxysmal atrial fibrillation  . Hypertension    History of Present Illness: Tracey Dudley is a 70 y.o. female seen just recently in the office by Ms. Lawrence NP on November 9. Recent medication adjustments included the addition of Aldactone for additional blood pressure control. Blood pressure control trend has looked much better. She is not reporting chest pain or palpitations. States that she has been enjoying pulmonary rehabilitation, has gained strength. Still uses supplemental oxygen.  Past Medical History:  Diagnosis Date  . Anxiety   . COPD (chronic obstructive pulmonary disease) (HCC)   . Essential hypertension   . Herpes zoster   . Hyperlipidemia   . PAF (paroxysmal atrial fibrillation) (HCC)    Followed by Dr. Leonie GreenLingle in [place], also [facility] EP assessment by Dr. Christin FudgeHegland  . Vitamin D deficiency     Current Outpatient Prescriptions  Medication Sig Dispense Refill  . acetaminophen (TYLENOL) 500 MG tablet Take 1,000 mg by mouth every 6 (six) hours as needed for moderate pain.    Marland Kitchen. albuterol (PROVENTIL HFA;VENTOLIN HFA) 108 (90 BASE) MCG/ACT inhaler Inhale 1 puff into the lungs every 6 (six) hours as needed for wheezing or shortness of breath.    . ALPRAZolam (XANAX) 0.5 MG tablet Take 0.5 mg by mouth 3 (three) times daily as needed for anxiety.    Marland Kitchen. atorvastatin (LIPITOR) 20 MG tablet Take 20 mg by mouth daily.    Marland Kitchen. dofetilide (TIKOSYN) 250 MCG capsule TAKE ONE CAPSULE BY MOUTH EVERY 12 HOURS 60 capsule 10  . EDARBI 80 MG TABS TAKE 1 TABLET BY MOUTH EVERY DAY 30 tablet 6  . fluticasone furoate-vilanterol (BREO ELLIPTA) 200-25 MCG/INH AEPB Inhale 1 puff into the lungs daily.    . hydrALAZINE (APRESOLINE) 100 MG tablet Take 1 tablet (100 mg total) by mouth 3  (three) times daily. 90 tablet 3  . INCRUSE ELLIPTA 62.5 MCG/INH AEPB INHALE 1 PUFF BY MOUTH EVERY DAY  0  . Nebivolol HCl (BYSTOLIC) 20 MG TABS Take 1 tablet by mouth daily.    . rivaroxaban (XARELTO) 20 MG TABS tablet Take 20 mg by mouth daily with supper.    Marland Kitchen. spironolactone (ALDACTONE) 25 MG tablet Take 0.5 tablets (12.5 mg total) by mouth daily. 30 tablet 3   No current facility-administered medications for this visit.    Allergies:  Metoprolol and Erythromycin   Social History: The patient  reports that she quit smoking about 2 years ago. Her smoking use included Cigarettes. She started smoking about 38 years ago. She has a 15.00 pack-year smoking history. She has never used smokeless tobacco. She reports that she does not drink alcohol or use drugs.   ROS:  Please see the history of present illness. Otherwise, complete review of systems is positive for none.  All other systems are reviewed and negative.   Physical Exam: VS:  BP (!) 154/74   Pulse 68   Ht 5\' 6"  (1.676 m)   Wt 168 lb (76.2 kg)   SpO2 95%   BMI 27.12 kg/m , BMI Body mass index is 27.12 kg/m.  Wt Readings from Last 3 Encounters:  08/02/16 168 lb (76.2 kg)  07/26/16 165 lb (74.8 kg)  07/12/16 166 lb (75.3 kg)    General: Patient appears comfortable  at rest. Wearing oxygen via nasal cannula. HEENT: Conjunctiva and lids normal, oropharynx clear. Neck: Supple, no elevated JVP or carotid bruits, no thyromegaly. Lungs: Diminished breath sounds without wheezing, nonlabored breathing at rest. Cardiac: Regular rate and rhythm, no S3 or significant systolic murmur, no pericardial rub. Abdomen: Soft, nontender, bowel sounds present, no guarding or rebound. Extremities: No pitting edema, distal pulses 2+.  ECG: I personally reviewed the tracing from 07/12/2016 which showed sinus bradycardia with poor anterior R-wave progression and nonspecific ST-T wave changes.  Recent Labwork: 10/08/2015: ALT 20; AST 18; B Natriuretic  Peptide 735.0 11/04/2015: Magnesium 1.9 11/29/2015: Hemoglobin 10.7; Platelets 125 07/24/2016: BUN 14; Creat 1.14; Potassium 4.3; Sodium 140   Other Studies Reviewed Today:  Echocardiogram 10/10/2015: Study Conclusions  - Left ventricle: The cavity size was normal. Wall thickness was   increased in a pattern of mild LVH. Systolic function was   vigorous. The estimated ejection fraction was in the range of 75%   to 80%. Wall motion was normal; there were no regional wall   motion abnormalities. Features are consistent with a pseudonormal   left ventricular filling pattern, with concomitant abnormal   relaxation and increased filling pressure (grade 2 diastolic   dysfunction). - Aortic valve: Poorly visualized. Mildly calcified annulus.   Probably trileaflet. Mean gradient (S): 13 mm Hg. Peak gradient   (S): 24 mm Hg. Gradients likely increased due to relatively small   LVOT and vigorous contraction. Limited views of valve leaflets   show grossly preserved excursion. - Mitral valve: Mildly thickened leaflets . There was mild   regurgitation. - Left atrium: The atrium was at the upper limits of normal in   size. - Right atrium: Central venous pressure (est): 3 mm Hg. - Tricuspid valve: There was trivial regurgitation. - Pulmonary arteries: Systolic pressure could not be accurately   estimated. - Pericardium, extracardiac: A prominent pericardial fat pad was   present.  Impressions:  - Mild LVH with LVEF 75-80% and grade 2 diastolic dysfunction with   increased filling pressures. Normal left atrial chamber size.   Mildly thickened mitral leaflets with mild mitral regurgitation.   Aortic valve appears mildly sclerotic without definitive   stenosis. Trivial tricuspid regurgitation, PASP not estimated.  Assessment and Plan:  1. Essential hypertension. Blood pressure trend does look better. Plan to continue current regimen including Edarbi, hydralazine, Bystolic, and Aldactone.  Recent lab work reviewed with normal potassium and creatinine.  2. Paroxysmal atrial fibrillation, maintaining sinus rhythm on Tikosyn. She is on Xarelto for stroke prophylaxis.  3. Oxygen-dependent COPD, followed by PCP.  Current medicines were reviewed with the patient today.  Disposition: Follow up in 4 months.  Signed, Jonelle SidleSamuel G. Ari Engelbrecht, MD, [facility] 08/02/2016 11:36 AM    [facility] [address], [place], [place] [address] Phone: [phone]; Fax: [phone]

## 2016-08-02 ENCOUNTER — Encounter: Payer: Self-pay | Admitting: Cardiology

## 2016-08-02 ENCOUNTER — Ambulatory Visit: Payer: Medicare Other | Admitting: Cardiology

## 2016-08-02 ENCOUNTER — Ambulatory Visit (INDEPENDENT_AMBULATORY_CARE_PROVIDER_SITE_OTHER): Payer: Medicare Other | Admitting: Cardiology

## 2016-08-02 VITALS — BP 154/74 | HR 68 | Ht 66.0 in | Wt 168.0 lb

## 2016-08-02 DIAGNOSIS — I10 Essential (primary) hypertension: Secondary | ICD-10-CM | POA: Diagnosis not present

## 2016-08-02 DIAGNOSIS — I480 Paroxysmal atrial fibrillation: Secondary | ICD-10-CM

## 2016-08-02 DIAGNOSIS — J449 Chronic obstructive pulmonary disease, unspecified: Secondary | ICD-10-CM | POA: Diagnosis not present

## 2016-08-02 NOTE — Patient Instructions (Signed)
Medication Instructions:   Your physician recommends that you continue on your current medications as directed. Please refer to the Current Medication list given to you today.  Labwork:  none  Testing/Procedures:  none  Follow-Up:  Your physician recommends that you schedule a follow-up appointment in: 4 months.   Any Other Special Instructions Will Be Listed Below (If Applicable).  If you need a refill on your cardiac medications before your next appointment, please call your pharmacy. 

## 2016-08-11 ENCOUNTER — Other Ambulatory Visit: Payer: Self-pay | Admitting: Cardiology

## 2016-11-01 ENCOUNTER — Other Ambulatory Visit: Payer: Self-pay | Admitting: Cardiology

## 2016-11-12 ENCOUNTER — Emergency Department (HOSPITAL_COMMUNITY)
Admission: EM | Admit: 2016-11-12 | Discharge: 2016-11-12 | Disposition: A | Discharge status: Home / Self Care | Payer: Medicare Other | Attending: Emergency Medicine | Admitting: Emergency Medicine

## 2016-11-12 ENCOUNTER — Encounter (HOSPITAL_COMMUNITY): Payer: Self-pay | Admitting: Emergency Medicine

## 2016-11-12 ENCOUNTER — Emergency Department (HOSPITAL_COMMUNITY): Payer: Medicare Other

## 2016-11-12 DIAGNOSIS — I10 Essential (primary) hypertension: Secondary | ICD-10-CM | POA: Diagnosis not present

## 2016-11-12 DIAGNOSIS — I480 Paroxysmal atrial fibrillation: Secondary | ICD-10-CM | POA: Insufficient documentation

## 2016-11-12 DIAGNOSIS — Z87891 Personal history of nicotine dependence: Secondary | ICD-10-CM | POA: Insufficient documentation

## 2016-11-12 DIAGNOSIS — R000 Tachycardia, unspecified: Secondary | ICD-10-CM | POA: Diagnosis present

## 2016-11-12 DIAGNOSIS — Z79899 Other long term (current) drug therapy: Secondary | ICD-10-CM | POA: Diagnosis not present

## 2016-11-12 DIAGNOSIS — J449 Chronic obstructive pulmonary disease, unspecified: Secondary | ICD-10-CM | POA: Insufficient documentation

## 2016-11-12 DIAGNOSIS — I4891 Unspecified atrial fibrillation: Secondary | ICD-10-CM

## 2016-11-12 LAB — CBC WITH DIFFERENTIAL/PLATELET
BASOS ABS: 0 10*3/uL (ref 0.0–0.1)
Basophils Relative: 0 %
Eosinophils Absolute: 0.1 10*3/uL (ref 0.0–0.7)
Eosinophils Relative: 1 %
HCT: 39.5 % (ref 36.0–46.0)
Hemoglobin: 13 g/dL (ref 12.0–15.0)
LYMPHS PCT: 17 %
Lymphs Abs: 1.1 10*3/uL (ref 0.7–4.0)
MCH: 30.9 pg (ref 26.0–34.0)
MCHC: 32.9 g/dL (ref 30.0–36.0)
MCV: 93.8 fL (ref 78.0–100.0)
MONO ABS: 0.5 10*3/uL (ref 0.1–1.0)
Monocytes Relative: 7 %
NEUTROS ABS: 4.7 10*3/uL (ref 1.7–7.7)
Neutrophils Relative %: 75 %
Platelets: 149 10*3/uL — ABNORMAL LOW (ref 150–400)
RBC: 4.21 MIL/uL (ref 3.87–5.11)
RDW: 13.1 % (ref 11.5–15.5)
WBC: 6.4 10*3/uL (ref 4.0–10.5)

## 2016-11-12 LAB — BASIC METABOLIC PANEL
ANION GAP: 8 (ref 5–15)
BUN: 17 mg/dL (ref 6–20)
CALCIUM: 8.8 mg/dL — AB (ref 8.9–10.3)
CO2: 30 mmol/L (ref 22–32)
Chloride: 99 mmol/L — ABNORMAL LOW (ref 101–111)
Creatinine, Ser: 1.31 mg/dL — ABNORMAL HIGH (ref 0.44–1.00)
GFR calc Af Amer: 47 mL/min — ABNORMAL LOW (ref >60)
GFR calc non Af Amer: 41 mL/min — ABNORMAL LOW (ref >60)
Glucose, Bld: 115 mg/dL — ABNORMAL HIGH (ref 65–99)
POTASSIUM: 3.5 mmol/L (ref 3.5–5.1)
Sodium: 137 mmol/L (ref 135–145)

## 2016-11-12 LAB — TROPONIN I: Troponin I: <0.03 ng/mL (ref <0.03)

## 2016-11-12 MED ORDER — ETOMIDATE 2 MG/ML IV SOLN
INTRAVENOUS | Status: AC
  Filled 2016-11-12: qty 10

## 2016-11-12 MED ORDER — ETOMIDATE 2 MG/ML IV SOLN
INTRAVENOUS | Status: AC | PRN
  Administered 2016-11-12: 8 mg via INTRAVENOUS

## 2016-11-12 NOTE — Sedation Documentation (Signed)
Synchronized cardioversion at 50j by Vic BlackbirdAmanda Crews, RN with Dr Estell HarpinZammit, RT and Donnelly Angelicahristy Hyatt, RN at bedside.

## 2016-11-12 NOTE — Discharge Instructions (Signed)
Follow-up with her cardiologist next week. Return sooner if problems.

## 2016-11-12 NOTE — ED Provider Notes (Signed)
[facility] DEPT Provider Note   CSN: [id number] Arrival date & time: 11/12/16  1215     History   Chief Complaint Chief Complaint  Patient presents with  . Tachycardia    HPI Tracey Dudley is a 71 y.o. female.  Patient complains of palpitations that started last night. She states that she has a history of April the was cardioverted 4 years ago and has been in sinus since then.   The history is provided by the patient. No language interpreter was used.  Palpitations   This is a recurrent problem. The current episode started 12 to 24 hours ago. The problem occurs constantly. The problem has not changed since onset.The problem is associated with an unknown factor. Pertinent negatives include no fever, no chest pain, no abdominal pain, no headaches, no back pain and no cough.    Past Medical History:  Diagnosis Date  . Anxiety   . COPD (chronic obstructive pulmonary disease) (HCC)   . Essential hypertension   . Herpes zoster   . Hyperlipidemia   . PAF (paroxysmal atrial fibrillation) (HCC)    Followed by Dr. Leonie GreenLingle in [place], also [facility] EP assessment by Dr. Christin FudgeHegland  . Vitamin D deficiency     Patient Active Problem List   Diagnosis Date Noted  . CAP (community acquired pneumonia) 10/08/2015  . A-fib (HCC) 10/08/2015  . HTN (hypertension) 10/08/2015  . COPD with exacerbation (HCC) 10/08/2015    Past Surgical History:  Procedure Laterality Date  . ABDOMINAL HYSTERECTOMY    . CATARACT EXTRACTION      OB History    Gravida Para Term Preterm AB Living             1   SAB TAB Ectopic Multiple Live Births                   Home Medications    Prior to Admission medications   Medication Sig Start Date End Date Taking? Authorizing Provider  acetaminophen (TYLENOL) 500 MG tablet Take 1,000 mg by mouth every 6 (six) hours as needed for moderate pain.   Yes Historical Provider, MD  albuterol (PROVENTIL HFA;VENTOLIN HFA) 108 (90 BASE) MCG/ACT inhaler Inhale 1  puff into the lungs every 6 (six) hours as needed for wheezing or shortness of breath.   Yes Historical Provider, MD  ALPRAZolam Prudy Feeler(XANAX) 0.5 MG tablet Take 0.5 mg by mouth 3 (three) times daily as needed for anxiety.   Yes Historical Provider, MD  atorvastatin (LIPITOR) 20 MG tablet Take 20 mg by mouth daily.   Yes Historical Provider, MD  BYSTOLIC 20 MG TABS TAKE 1 TABLET BY MOUTH EVERY DAY 08/13/16  Yes Jonelle SidleSamuel G McDowell, MD  dofetilide (TIKOSYN) 250 MCG capsule TAKE ONE CAPSULE BY MOUTH EVERY 12 HOURS 02/16/16  Yes Jonelle SidleSamuel G McDowell, MD  EDARBI 80 MG TABS TAKE 1 TABLET BY MOUTH EVERY DAY 08/01/16  Yes Jonelle SidleSamuel G McDowell, MD  hydrALAZINE (APRESOLINE) 100 MG tablet TAKE 1 TABLET BY MOUTH 3 TIMES A DAY 11/01/16  Yes Jonelle SidleSamuel G McDowell, MD  rivaroxaban (XARELTO) 20 MG TABS tablet Take 20 mg by mouth daily with supper.   Yes Historical Provider, MD  spironolactone (ALDACTONE) 25 MG tablet Take 0.5 tablets (12.5 mg total) by mouth daily. 07/12/16  Yes Jodelle GrossKathryn M Lawrence, NP  TRELEGY ELLIPTA 100-62.5-25 MCG/INH AEPB INHALE 1 PUFF BY MOUTH EVERY DAY 11/02/16  Yes Historical Provider, MD    Family History Family History  Problem Relation Age of  Onset  . Breast cancer Sister   . CAD Father   . Asthma Mother     Social History Social History  Substance Use Topics  . Smoking status: Former Smoker    Packs/day: 0.50    Years: 30.00    Types: Cigarettes    Start date: 11/03/1977    Quit date: 06/01/2014  . Smokeless tobacco: Never Used  . Alcohol use No     Allergies   Metoprolol and Erythromycin   Review of Systems Review of Systems  Constitutional: Negative for appetite change, fatigue and fever.  HENT: Negative for congestion, ear discharge and sinus pressure.   Eyes: Negative for discharge.  Respiratory: Negative for cough.   Cardiovascular: Positive for palpitations. Negative for chest pain.  Gastrointestinal: Negative for abdominal pain and diarrhea.  Genitourinary: Negative for  frequency and hematuria.  Musculoskeletal: Negative for back pain.  Skin: Negative for rash.  Neurological: Negative for seizures and headaches.  Psychiatric/Behavioral: Negative for hallucinations.     Physical Exam Updated Vital Signs BP 125/63 (BP Location: Left Arm)   Pulse 60   Temp 98.3 F (36.8 C) (Oral)   Resp 19   Ht 5\' 6"  (1.676 m)   Wt 169 lb (76.7 kg)   SpO2 99%   BMI 27.28 kg/m   Physical Exam  Constitutional: She is oriented to person, place, and time. She appears well-developed.  HENT:  Head: Normocephalic.  Eyes: Conjunctivae and EOM are normal. No scleral icterus.  Neck: Neck supple. No thyromegaly present.  Cardiovascular: Exam reveals no gallop and no friction rub.   No murmur heard. Irregular rapid heartbeat  Pulmonary/Chest: No stridor. She has no wheezes. She has no rales. She exhibits no tenderness.  Abdominal: She exhibits no distension. There is no tenderness. There is no rebound.  Musculoskeletal: Normal range of motion. She exhibits no edema.  Lymphadenopathy:    She has no cervical adenopathy.  Neurological: She is oriented to person, place, and time. She exhibits normal muscle tone. Coordination normal.  Skin: No rash noted. No erythema.  Psychiatric: She has a normal mood and affect. Her behavior is normal.     ED Treatments / Results  Labs (all labs ordered are listed, but only abnormal results are displayed) Labs Reviewed  CBC WITH DIFFERENTIAL/PLATELET - Abnormal; Notable for the following:       Result Value   Platelets 149 (*)    All other components within normal limits  BASIC METABOLIC PANEL - Abnormal; Notable for the following:    Chloride 99 (*)    Glucose, Bld 115 (*)    Creatinine, Ser 1.31 (*)    Calcium 8.8 (*)    GFR calc non Af Amer 41 (*)    GFR calc Af Amer 47 (*)    All other components within normal limits  TROPONIN I    EKG  EKG Interpretation  Date/Time:  Monday November 12 2016 14:13:01  EST Ventricular Rate:  45 PR Interval:    QRS Duration: 100 QT Interval:  461 QTC Calculation: 399 R Axis:   86 Text Interpretation:  Sinus or ectopic atrial bradycardia Borderline right axis deviation Probable anteroseptal infarct, old Confirmed by Tracey Vaness  MD, Tracey Dudley [phone]) on 11/12/2016 4:02:01 PM       Radiology Dg Chest Portable 1 View  Result Date: 11/12/2016 CLINICAL DATA:  Tachycardia began last night. History of atrial fibrillation and cardioversion 4 years ago. Former smoker. EXAM: PORTABLE CHEST 1 VIEW COMPARISON:  PA and  lateral chest x-ray of November 29, 2015 FINDINGS: The lungs remain hyperinflated. The interstitial markings are coarse. There is no alveolar infiltrate or pleural effusion. The cardiac silhouette is mildly enlarged. The central pulmonary vascularity is prominent but there is no definite cephalization. There is calcification in the wall of the aortic arch. The mediastinum is normal in width. The bony thorax exhibits no acute abnormality. IMPRESSION: COPD-smoking related changes, stable. Mild cardiomegaly with central pulmonary vascular prominence but no definite pulmonary edema. Electronically Signed   By: David  [place] M.D.   On: 11/12/2016 12:56    Procedures .Cardioversion Date/Time: 11/12/2016 4:24 PM Performed by: Bethann Berkshire Authorized by: Bethann Berkshire   Comments:     Patient was given etomidate for anesthesia. She then was cardioverted with 50 J. The patient went into sinus rhythm immediately. She tolerated the procedure well. Patient woke up after about 15 minutes from the time she got the etomidate.   (including critical care time)  Medications Ordered in ED Medications  etomidate (AMIDATE) 2 MG/ML injection (not administered)  etomidate (AMIDATE) injection (8 mg Intravenous Given 11/12/16 1410)     Initial Impression / Assessment and Plan / ED Course  I have reviewed the triage vital signs and the nursing notes.  Pertinent labs & imaging  results that were available during my care of the patient were reviewed by me and considered in my medical decision making (see chart for details). I spoke with the cardiologist Dr. Diona Browner. He suggested going ahead and cardiovert the patient. She has been on Cymbalta for quite some time.   CRITICAL CARE Performed by: Saavi Mceachron L Total critical care time:35 minutes Critical care time was exclusive of separately billable procedures and treating other patients. Critical care was necessary to treat or prevent imminent or life-threatening deterioration. Critical care was time spent personally by me on the following activities: development of treatment plan with patient and/or surrogate as well as nursing, discussions with consultants, evaluation of patient's response to treatment, examination of patient, obtaining history from patient or surrogate, ordering and performing treatments and interventions, ordering and review of laboratory studies, ordering and review of radiographic studies, pulse oximetry and re-evaluation of patient's condition.   Final Clinical Impressions(s) / ED Diagnoses   Final diagnoses:  Atrial fibrillation with RVR (HCC)  Patient with rapid atrial fib. Patient was cardioverted in the emergency department without problems. She was observed another 2 hours and was discharged home in sinus rhythm normal follow-up with her cardiologist New Prescriptions New Prescriptions   No medications on file     Bethann Berkshire, MD 11/12/16 1625

## 2016-11-12 NOTE — ED Triage Notes (Signed)
Pt stated she started feeling her heart racing last night. Though it was anxiety due to a friend passing yesterday. Pt always on 2L [place] oxygen at home, cont in ED.  Pt denies cp/sob/n/v/d. States hasnt been in over 2 years.

## 2016-11-16 ENCOUNTER — Encounter: Payer: Self-pay | Admitting: Adult Health

## 2016-11-16 ENCOUNTER — Ambulatory Visit (INDEPENDENT_AMBULATORY_CARE_PROVIDER_SITE_OTHER): Payer: Medicare Other | Admitting: Adult Health

## 2016-11-16 VITALS — BP 128/70 | HR 57 | Ht 66.0 in | Wt 171.0 lb

## 2016-11-16 DIAGNOSIS — I480 Paroxysmal atrial fibrillation: Secondary | ICD-10-CM

## 2016-11-16 DIAGNOSIS — I10 Essential (primary) hypertension: Secondary | ICD-10-CM

## 2016-11-16 DIAGNOSIS — Z79899 Other long term (current) drug therapy: Secondary | ICD-10-CM | POA: Diagnosis not present

## 2016-11-16 NOTE — Patient Instructions (Signed)
Medication Instructions:  Your physician recommends that you continue on your current medications as directed. Please refer to the Current Medication list given to you today.   Labwork: Your physician recommends that you return for lab work in: just before next visit    Testing/Procedures: none  Follow-Up: Your physician recommends that you schedule a follow-up appointment in: keep appointment with Dr, Diona BrownerMcDowell   Any Other Special Instructions Will Be Listed Below (If Applicable).     If you need a refill on your cardiac medications before your next appointment, please call your pharmacy.

## 2016-11-16 NOTE — Progress Notes (Signed)
Name: Tracey Dudley    DOB: 15-Aug-1946  Age: 71 y.o.  MR#: [id number]       PCP:  Lenoria Chime, FNP      Insurance: Payor: BLUE CR[employer]UE [employer]IELD MEDICARE / Plan: BCBS MEDICARE / Product Type: *No Product type* /   CC:   No chief complaint on file.   VS Vitals:   11/16/16 1428  Pulse: (!) 57  SpO2: 97%  Weight: 171 lb (77.6 kg)  Height: 5\' 6"  (1.676 m)    Weights Current Weight  11/16/16 171 lb (77.6 kg)  11/12/16 169 lb (76.7 kg)  08/02/16 168 lb (76.2 kg)    Blood Pressure  BP Readings from Last 3 Encounters:  11/12/16 125/63  08/02/16 (!) 154/74  07/26/16 (!) 154/84     Admit date:  (Not on file) Last encounter with RMR:  07/26/2016   Allergy Metoprolol and Erythromycin  Current Outpatient Prescriptions  Medication Sig Dispense Refill  . acetaminophen (TYLENOL) 500 MG tablet Take 1,000 mg by mouth every 6 (six) hours as needed for moderate pain.    Marland Kitchen albuterol (PROVENTIL HFA;VENTOLIN HFA) 108 (90 BASE) MCG/ACT inhaler Inhale 1 puff into the lungs every 6 (six) hours as needed for wheezing or shortness of breath.    . ALPRAZolam (XANAX) 0.5 MG tablet Take 0.5 mg by mouth 3 (three) times daily as needed for anxiety.    Marland Kitchen atorvastatin (LIPITOR) 20 MG tablet Take 20 mg by mouth daily.    Marland Kitchen BYSTOLIC 20 MG TABS TAKE 1 TABLET BY MOUTH EVERY DAY 30 tablet 10  . dofetilide (TIKOSYN) 250 MCG capsule TAKE ONE CAPSULE BY MOUTH EVERY 12 HOURS 60 capsule 10  . EDARBI 80 MG TABS TAKE 1 TABLET BY MOUTH EVERY DAY 30 tablet 6  . hydrALAZINE (APRESOLINE) 100 MG tablet TAKE 1 TABLET BY MOUTH 3 TIMES A DAY 90 tablet 3  . rivaroxaban (XARELTO) 20 MG TABS tablet Take 20 mg by mouth daily with supper.    Marland Kitchen spironolactone (ALDACTONE) 25 MG tablet Take 0.5 tablets (12.5 mg total) by mouth daily. 30 tablet 3  . TRELEGY ELLIPTA 100-62.5-25 MCG/INH AEPB INHALE 1 PUFF BY MOUTH EVERY DAY  0   No current facility-administered medications for this visit.     Discontinued Meds:   There are no  discontinued medications.  Patient Active Problem List   Diagnosis Date Noted  . CAP (community acquired pneumonia) 10/08/2015  . A-fib (HCC) 10/08/2015  . HTN (hypertension) 10/08/2015  . COPD with exacerbation (HCC) 10/08/2015    LABS    Component Value Date/Time   NA 137 11/12/2016 1221   NA 140 07/24/2016 1513   NA 142 11/29/2015 1546   K 3.5 11/12/2016 1221   K 4.3 07/24/2016 1513   K 3.9 11/29/2015 1546   CL 99 (L) 11/12/2016 1221   CL 98 07/24/2016 1513   CL 99 (L) 11/29/2015 1546   CO2 30 11/12/2016 1221   CO2 37 (H) 07/24/2016 1513   CO2 37 (H) 11/29/2015 1546   GLUCOSE 115 (H) 11/12/2016 1221   GLUCOSE 76 07/24/2016 1513   GLUCOSE 77 11/29/2015 1546   BUN 17 11/12/2016 1221   BUN 14 07/24/2016 1513   BUN 12 11/29/2015 1546   CREATININE 1.31 (H) 11/12/2016 1221   CREATININE 1.14 (H) 07/24/2016 1513   CREATININE 0.95 11/29/2015 1546   CREATININE 1.25 (H) 11/04/2015 1430   CREATININE 1.26 (H) 10/09/2015 0541   CALCIUM 8.8 (L) 11/12/2016 1221  CALCIUM 9.1 07/24/2016 1513   CALCIUM 8.7 (L) 11/29/2015 1546   GFRNONAA 41 (L) 11/12/2016 1221   GFRNONAA >60 11/29/2015 1546   GFRNONAA 43 (L) 10/09/2015 0541   GFRAA 47 (L) 11/12/2016 1221   GFRAA >60 11/29/2015 1546   GFRAA 50 (L) 10/09/2015 0541   CMP     Component Value Date/Time   NA 137 11/12/2016 1221   K 3.5 11/12/2016 1221   CL 99 (L) 11/12/2016 1221   CO2 30 11/12/2016 1221   GLUCOSE 115 (H) 11/12/2016 1221   BUN 17 11/12/2016 1221   CREATININE 1.31 (H) 11/12/2016 1221   CREATININE 1.14 (H) 07/24/2016 1513   CALCIUM 8.8 (L) 11/12/2016 1221   PROT 6.7 10/08/2015 1222   ALBUMIN 3.2 (L) 10/08/2015 1222   AST 18 10/08/2015 1222   ALT 20 10/08/2015 1222   ALKPHOS 72 10/08/2015 1222   BILITOT 1.5 (H) 10/08/2015 1222   GFRNONAA 41 (L) 11/12/2016 1221   GFRAA 47 (L) 11/12/2016 1221       Component Value Date/Time   WBC 6.4 11/12/2016 1221   WBC 6.6 11/29/2015 1546   WBC 9.8 10/09/2015 0541    HGB 13.0 11/12/2016 1221   HGB 10.7 (L) 11/29/2015 1546   HGB 11.1 (L) 10/09/2015 0541   HCT 39.5 11/12/2016 1221   HCT 33.8 (L) 11/29/2015 1546   HCT 35.3 (L) 10/09/2015 0541   MCV 93.8 11/12/2016 1221   MCV 100.0 11/29/2015 1546   MCV 99.4 10/09/2015 0541    Lipid Panel  No results found for: CHOL, TRIG, HDL, CHOLHDL, VLDL, LDLCALC, LDLDIRECT  ABG No results found for: PHART, PCO2ART, PO2ART, HCO3, TCO2, ACIDBASEDEF, O2SAT   No results found for: TSH BNP (last 3 results) No results for input(s): BNP in the last 8760 hours.  ProBNP (last 3 results) No results for input(s): PROBNP in the last 8760 hours.  Cardiac Panel (last 3 results) No results for input(s): CKTOTAL, CKMB, TROPONINI, RELINDX in the last 72 hours.  Iron/TIBC/Ferritin/ %Sat No results found for: IRON, TIBC, FERRITIN, IRONPCTSAT   EKG Orders placed or performed during the hospital encounter of 11/12/16  . ED EKG  . EKG 12-Lead  . EKG 12-Lead  . ED EKG  . EKG 12-Lead  . EKG 12-Lead  . EKG 12-Lead  . EKG 12-Lead  . EKG 12-Lead  . EKG 12-Lead  . EKG 12-Lead  . EKG 12-Lead  . EKG     Prior Assessment and Plan Problem List as of 11/16/2016 Reviewed: 07/26/2016  3:37 PM by Joni Reining, NP     Cardiovascular and Mediastinum   A-fib (HCC)   HTN (hypertension)     Respiratory   CAP (community acquired pneumonia)   COPD with exacerbation [facility])       Imaging: Dg Chest Portable 1 View  Result Date: 11/12/2016 CLINICAL DATA:  Tachycardia began last night. History of atrial fibrillation and cardioversion 4 years ago. Former smoker. EXAM: PORTABLE CHEST 1 VIEW COMPARISON:  PA and lateral chest x-ray of November 29, 2015 FINDINGS: The lungs remain hyperinflated. The interstitial markings are coarse. There is no alveolar infiltrate or pleural effusion. The cardiac silhouette is mildly enlarged. The central pulmonary vascularity is prominent but there is no definite cephalization. There is calcification in  the wall of the aortic arch. The mediastinum is normal in width. The bony thorax exhibits no acute abnormality. IMPRESSION: COPD-smoking related changes, stable. Mild cardiomegaly with central pulmonary vascular prominence but no definite pulmonary edema. Electronically  Signed   By: David  SwazilandJordan M.D.   On: 11/12/2016 12:56

## 2016-11-16 NOTE — Progress Notes (Signed)
Cardiology Office Note   Date:  11/16/2016   ID:  Tracey Dudley, DOB 03-11-1946, MRN [id number]  PCP:  Lenoria ChimeWhite, Valerie A, FNP  Cardiologist: McDowell/  Joni ReiningKathryn Halayna Blane, NP   Chief Complaint  Patient presents with  . Atrial Fibrillation      History of Present Illness: Tracey Dudley is a 71 y.o. female who presents for ongoing assessment and management of hypertension, paroxysmal atrial fibrillation, with history of oxygen dependent COPD. Patient was last seen by Dr. Diona BrownerMcDowell on 08/02/2016. At the time she was clinically stable. She was undergoing pulmonary rehabilitation.  She was seen in the ER on 11/12/2016 with afib RVR that has been occurring over 12  hours. HR 114 bpm. She was symptomatic with fatigue and increased anxiety. She had DCCV after ER physician had conversation with Dr. Diona BrownerMcDowell by phone. She was cardioverted with 50 j, and returned to NSR.HR 45 bpm.    Since that time she has felt well. Continues to go to pulmonary rehab. She is diligent on taking medications as directed. No new medication changes were made post ER.   Past Medical History:  Diagnosis Date  . Anxiety   . COPD (chronic obstructive pulmonary disease) (HCC)   . Essential hypertension   . Herpes zoster   . Hyperlipidemia   . PAF (paroxysmal atrial fibrillation) (HCC)    Followed by Dr. Leonie GreenLingle in [place], also [facility] EP assessment by Dr. Christin FudgeHegland  . Vitamin D deficiency     Past Surgical History:  Procedure Laterality Date  . ABDOMINAL HYSTERECTOMY    . CATARACT EXTRACTION       Current Outpatient Prescriptions  Medication Sig Dispense Refill  . acetaminophen (TYLENOL) 500 MG tablet Take 1,000 mg by mouth every 6 (six) hours as needed for moderate pain.    Marland Kitchen. albuterol (PROVENTIL HFA;VENTOLIN HFA) 108 (90 BASE) MCG/ACT inhaler Inhale 1 puff into the lungs every 6 (six) hours as needed for wheezing or shortness of breath.    . ALPRAZolam (XANAX) 0.5 MG tablet Take 0.5 mg by mouth 3 (three) times daily  as needed for anxiety.    Marland Kitchen. atorvastatin (LIPITOR) 20 MG tablet Take 20 mg by mouth daily.    Marland Kitchen. BYSTOLIC 20 MG TABS TAKE 1 TABLET BY MOUTH EVERY DAY 30 tablet 10  . dofetilide (TIKOSYN) 250 MCG capsule TAKE ONE CAPSULE BY MOUTH EVERY 12 HOURS 60 capsule 10  . EDARBI 80 MG TABS TAKE 1 TABLET BY MOUTH EVERY DAY 30 tablet 6  . hydrALAZINE (APRESOLINE) 100 MG tablet TAKE 1 TABLET BY MOUTH 3 TIMES A DAY 90 tablet 3  . rivaroxaban (XARELTO) 20 MG TABS tablet Take 20 mg by mouth daily with supper.    Marland Kitchen. spironolactone (ALDACTONE) 25 MG tablet Take 0.5 tablets (12.5 mg total) by mouth daily. 30 tablet 3  . TRELEGY ELLIPTA 100-62.5-25 MCG/INH AEPB INHALE 1 PUFF BY MOUTH EVERY DAY  0   No current facility-administered medications for this visit.     Allergies:   Metoprolol and Erythromycin    Social History:  The patient  reports that she quit smoking about 2 years ago. Her smoking use included Cigarettes. She started smoking about 39 years ago. She has a 15.00 pack-year smoking history. She has never used smokeless tobacco. She reports that she does not drink alcohol or use drugs.   Family History:  The patient's family history includes Asthma in her mother; Breast cancer in her sister; CAD in her father.  ROS: All other systems are reviewed and negative. Unless otherwise mentioned in H&P    PHYSICAL EXAM: VS:  BP 128/70   Pulse (!) 57   Ht 5\' 6"  (1.676 m)   Wt 171 lb (77.6 kg)   SpO2 97%   BMI 27.60 kg/m  , BMI Body mass index is 27.6 kg/m. GEN: Well nourished, well developed, in no acute distress  HEENT: normal  Neck: no JVD, carotid bruits, or masses Cardiac: RRR; bradycardic no murmurs, rubs, or gallops,no edema  Respiratory:  clear to auscultation bilaterally, normal work of breathing. Wearing portable O2. GI: soft, nontender, nondistended, + BS MS: no deformity or atrophy  Skin: warm and dry, no rash Neuro:  Strength and sensation are intact Psych: euthymic mood, full  affect   Recent Labs: 11/12/2016: BUN 17; Creatinine, Ser 1.31; Hemoglobin 13.0; Platelets 149; Potassium 3.5; Sodium 137    Lipid Panel No results found for: CHOL, TRIG, HDL, CHOLHDL, VLDL, LDLCALC, LDLDIRECT    Wt Readings from Last 3 Encounters:  11/16/16 171 lb (77.6 kg)  11/12/16 169 lb (76.7 kg)  08/02/16 168 lb (76.2 kg)    Other studies Reviewed: Echocardiogram November 08, 2015 Left ventricle: The cavity size was normal. Wall thickness was   increased in a pattern of mild LVH. Systolic function was   vigorous. The estimated ejection fraction was in the range of 75%   to 80%. Wall motion was normal; there were no regional wall   motion abnormalities. Features are consistent with a pseudonormal   left ventricular filling pattern, with concomitant abnormal   relaxation and increased filling pressure (grade 2 diastolic   dysfunction). - Aortic valve: Poorly visualized. Mildly calcified annulus.   Probably trileaflet. Mean gradient (S): 13 mm Hg. Peak gradient   (S): 24 mm Hg. Gradients likely increased due to relatively small   LVOT and vigorous contraction. Limited views of valve leaflets   show grossly preserved excursion. - Mitral valve: Mildly thickened leaflets . There was mild   regurgitation. - Left atrium: The atrium was at the upper limits of normal in   size. - Right atrium: Central venous pressure (est): 3 mm Hg. - Tricuspid valve: There was trivial regurgitation. - Pulmonary arteries: Systolic pressure could not be accurately   estimated. - Pericardium, extracardiac: A prominent pericardial fat pad was   present.   ASSESSMENT AND PLAN:  1.  PAF: S/P DCCV on 11/12/2016. Remains in SR on Tikosyn 250 mcg BID. No issues with bleeding or bruising on Xarelto. Will not make any medication changes at this time. It was noted that potassium was 3.5 in ER.  Will repeat BMET before next appointment.   2. Hypertension: BP is currently well controlled on Bystolic and  Spironolactone. No changes.   3. COPD: O2 dependent. Followed by Dr. Juanetta Gosling.    Current medicines are reviewed at length with the patient today.    Labs/ tests ordered today include:   Orders Placed This Encounter  Procedures  . Basic Metabolic Panel (BMET)     Disposition:   FU with Dr. Diona Browner on previously scheduled appointment.  Signed, Joni Reining, NP  11/16/2016 3:10 PM    [facility] [address], [place], [place] [address] Phone: [phone]; Fax: [phone]

## 2016-12-07 NOTE — Progress Notes (Signed)
Cardiology Office Note  Date: 12/11/2016   ID: Tracey Dudley, DOB Jun 05, 1946, MRN [id number]  PCP: Lenoria ChimeWhite, Valerie A, FNP  Primary Cardiologist: Nona DellSamuel Johnnay Pleitez, MD   Chief Complaint  Patient presents with  . Atrial Fibrillation    History of Present Illness: Tracey Dudley is a 71 y.o. female seen in the office by Ms. Lawrence NP on March 2. She had undergone successful cardioversion for recurrent atrial fibrillation, seen in the ER by Dr. Estell HarpinZammit on February 26. She presents today for a follow-up visit, describes no recurrent palpitations or chest pain. States that she has been compliant with her medications.  Current cardiac regimen includes Xarelto, hydralazine, Edarbi, Tikosyn, and Bystolic. I personally reviewed her follow-up ECG today which shows sinus bradycardia with incomplete right bundle branch block.  She states that she is being evaluated for tear duct surgery, sees her ophthalmologist later this week.  Past Medical History:  Diagnosis Date  . Anxiety   . COPD (chronic obstructive pulmonary disease) (HCC)   . Essential hypertension   . Herpes zoster   . Hyperlipidemia   . PAF (paroxysmal atrial fibrillation) (HCC)    Followed by Dr. Leonie GreenLingle in [place], also [facility] EP assessment by Dr. Christin FudgeHegland  . Vitamin D deficiency     Past Surgical History:  Procedure Laterality Date  . ABDOMINAL HYSTERECTOMY    . CATARACT EXTRACTION      Current Outpatient Prescriptions  Medication Sig Dispense Refill  . acetaminophen (TYLENOL) 500 MG tablet Take 1,000 mg by mouth every 6 (six) hours as needed for moderate pain.    Marland Kitchen. albuterol (PROVENTIL HFA;VENTOLIN HFA) 108 (90 BASE) MCG/ACT inhaler Inhale 1 puff into the lungs every 6 (six) hours as needed for wheezing or shortness of breath.    . ALPRAZolam (XANAX) 0.5 MG tablet Take 0.5 mg by mouth 3 (three) times daily as needed for anxiety.    Marland Kitchen. atorvastatin (LIPITOR) 20 MG tablet Take 20 mg by mouth daily.    Marland Kitchen. BYSTOLIC 20 MG TABS  TAKE 1 TABLET BY MOUTH EVERY DAY 30 tablet 10  . dofetilide (TIKOSYN) 250 MCG capsule TAKE ONE CAPSULE BY MOUTH EVERY 12 HOURS 60 capsule 10  . EDARBI 80 MG TABS TAKE 1 TABLET BY MOUTH EVERY DAY 30 tablet 6  . hydrALAZINE (APRESOLINE) 100 MG tablet TAKE 1 TABLET BY MOUTH 3 TIMES A DAY 90 tablet 3  . rivaroxaban (XARELTO) 20 MG TABS tablet Take 20 mg by mouth daily with supper.    Marland Kitchen. spironolactone (ALDACTONE) 25 MG tablet Take 0.5 tablets (12.5 mg total) by mouth daily. 30 tablet 3  . TRELEGY ELLIPTA 100-62.5-25 MCG/INH AEPB INHALE 1 PUFF BY MOUTH EVERY DAY  0   No current facility-administered medications for this visit.    Allergies:  Metoprolol and Erythromycin   Social History: The patient  reports that she quit smoking about 2 years ago. Her smoking use included Cigarettes. She started smoking about 39 years ago. She has a 15.00 pack-year smoking history. She has never used smokeless tobacco. She reports that she does not drink alcohol or use drugs.   ROS:  Please see the history of present illness. Otherwise, complete review of systems is positive for increased tearing of the right eye.  All other systems are reviewed and negative.   Physical Exam: VS:  BP (!) 150/70   Pulse (!) 49   Ht 5\' 6"  (1.676 m)   Wt 179 lb (81.2 kg)   SpO2 96%  BMI 28.89 kg/m , BMI Body mass index is 28.89 kg/m.  Wt Readings from Last 3 Encounters:  12/11/16 179 lb (81.2 kg)  11/16/16 171 lb (77.6 kg)  11/12/16 169 lb (76.7 kg)    General: Patient appears comfortable at rest. Wearing oxygen via nasal cannula. HEENT: Conjunctiva and lids normal, oropharynx clear. Neck: Supple, no elevated JVP or carotid bruits, no thyromegaly. Lungs: Diminished breath sounds without wheezing, nonlabored breathing at rest. Cardiac: Regular rate and rhythm, no S3 or significant systolic murmur, no pericardial rub. Abdomen: Soft, nontender, bowel sounds present, no guarding or rebound. Extremities: No pitting edema,  distal pulses 2+. Skin: Warm and dry. Musculoskeletal: No kyphosis. Neuropsychiatric: Alert and oriented 3, affect appropriate.  ECG: I personally reviewed the tracing from 11/12/2016 which showed atrial fibrillation with rightward axis and nonspecific ST changes.  Recent Labwork: 11/12/2016: Hemoglobin 13.0; Platelets 149 12/07/2016: BUN 21; Creat 1.37; Potassium 4.2; Sodium 142   Other Studies Reviewed Today:  Echocardiogram 10/10/2015: Study Conclusions  - Left ventricle: The cavity size was normal. Wall thickness was increased in a pattern of mild LVH. Systolic function was vigorous. The estimated ejection fraction was in the range of 75% to 80%. Wall motion was normal; there were no regional wall motion abnormalities. Features are consistent with a pseudonormal left ventricular filling pattern, with concomitant abnormal relaxation and increased filling pressure (grade 2 diastolic dysfunction). - Aortic valve: Poorly visualized. Mildly calcified annulus. Probably trileaflet. Mean gradient (S): 13 mm Hg. Peak gradient (S): 24 mm Hg. Gradients likely increased due to relatively small LVOT and vigorous contraction. Limited views of valve leaflets show grossly preserved excursion. - Mitral valve: Mildly thickened leaflets . There was mild regurgitation. - Left atrium: The atrium was at the upper limits of normal in size. - Right atrium: Central venous pressure (est): 3 mm Hg. - Tricuspid valve: There was trivial regurgitation. - Pulmonary arteries: Systolic pressure could not be accurately estimated. - Pericardium, extracardiac: A prominent pericardial fat pad was present.  Impressions:  - Mild LVH with LVEF 75-80% and grade 2 diastolic dysfunction with increased filling pressures. Normal left atrial chamber size. Mildly thickened mitral leaflets with mild mitral regurgitation. Aortic valve appears mildly sclerotic without  definitive stenosis. Trivial tricuspid regurgitation, PASP not estimated.  Assessment and Plan:  1. Paroxysmal atrial fibrillation, symptomatically stable at this time status post recent cardioversion in February. She continues on Xarelto for stroke prophylaxis, also Tikosyn for rhythm control. I reviewed her follow-up ECG and recent lab work. We will continue observation for now.  2. Essential hypertension, no changes made present regimen. Discussed exercise plan and sodium restriction.  3. COPD, oxygen dependent. She continues to participate in pulmonary rehabilitation.  4. Tobacco abuse in remission.  Current medicines were reviewed with the patient today.   Orders Placed This Encounter  Procedures  . EKG 12-Lead    Disposition: Follow-up in 6 months.  Signed, Jonelle Sidle, MD, [facility] 12/11/2016 12:15 PM    [facility] at [facility] [address], [place], [place] [address] Phone: [phone]; Fax: [phone]

## 2016-12-08 LAB — BASIC METABOLIC PANEL
BUN: 21 mg/dL (ref 7–25)
CALCIUM: 9.2 mg/dL (ref 8.6–10.4)
CO2: 34 mmol/L — ABNORMAL HIGH (ref 20–31)
CREATININE: 1.37 mg/dL — AB (ref 0.50–0.99)
Chloride: 99 mmol/L (ref 98–110)
Glucose, Bld: 85 mg/dL (ref 65–99)
Potassium: 4.2 mmol/L (ref 3.5–5.3)
Sodium: 142 mmol/L (ref 135–146)

## 2016-12-11 ENCOUNTER — Ambulatory Visit (INDEPENDENT_AMBULATORY_CARE_PROVIDER_SITE_OTHER): Payer: Medicare Other | Admitting: Cardiology

## 2016-12-11 ENCOUNTER — Encounter: Payer: Self-pay | Admitting: Cardiology

## 2016-12-11 VITALS — BP 150/70 | HR 49 | Ht 66.0 in | Wt 179.0 lb

## 2016-12-11 DIAGNOSIS — J449 Chronic obstructive pulmonary disease, unspecified: Secondary | ICD-10-CM | POA: Diagnosis not present

## 2016-12-11 DIAGNOSIS — F17201 Nicotine dependence, unspecified, in remission: Secondary | ICD-10-CM

## 2016-12-11 DIAGNOSIS — I10 Essential (primary) hypertension: Secondary | ICD-10-CM

## 2016-12-11 DIAGNOSIS — I480 Paroxysmal atrial fibrillation: Secondary | ICD-10-CM | POA: Diagnosis not present

## 2016-12-11 NOTE — Patient Instructions (Signed)
Your physician wants you to follow-up in: 6 months Dr McDowell You will receive a reminder letter in the mail two months in advance. If you don't receive a letter, please call our office to schedule the follow-up appointment.  Your physician recommends that you continue on your current medications as directed. Please refer to the Current Medication list given to you today.    If you need a refill on your cardiac medications before your next appointment, please call your pharmacy.       Thank you for choosing [facility] !         

## 2016-12-25 ENCOUNTER — Other Ambulatory Visit: Payer: Self-pay | Admitting: Cardiology

## 2017-02-05 ENCOUNTER — Other Ambulatory Visit (HOSPITAL_COMMUNITY): Payer: Self-pay | Admitting: Respiratory Therapy

## 2017-02-05 DIAGNOSIS — R0602 Shortness of breath: Secondary | ICD-10-CM

## 2017-02-06 ENCOUNTER — Ambulatory Visit (HOSPITAL_COMMUNITY)
Admission: RE | Admit: 2017-02-06 | Discharge: 2017-02-06 | Disposition: A | Discharge status: Home / Self Care | Payer: Medicare Other | Source: Ambulatory Visit | Attending: Pulmonary Disease | Admitting: Pulmonary Disease

## 2017-02-06 DIAGNOSIS — Z87891 Personal history of nicotine dependence: Secondary | ICD-10-CM | POA: Diagnosis not present

## 2017-02-06 DIAGNOSIS — R942 Abnormal results of pulmonary function studies: Secondary | ICD-10-CM | POA: Diagnosis not present

## 2017-02-06 DIAGNOSIS — R0602 Shortness of breath: Secondary | ICD-10-CM | POA: Diagnosis present

## 2017-02-06 MED ORDER — ALBUTEROL SULFATE (2.5 MG/3ML) 0.083% IN NEBU
2.5000 mg | INHALATION_SOLUTION | Freq: Once | RESPIRATORY_TRACT | Status: AC
  Administered 2017-02-06: 2.5 mg via RESPIRATORY_TRACT

## 2017-02-08 ENCOUNTER — Telehealth: Payer: Self-pay | Admitting: Cardiology

## 2017-02-08 NOTE — Telephone Encounter (Signed)
Patient is calling for advise on OTC allergy medication that she can take with her other meds./ tg

## 2017-02-08 NOTE — Telephone Encounter (Signed)
Will have Dr Diona BrownerMcDowell advise on this

## 2017-02-08 NOTE — Telephone Encounter (Signed)
Pt notified of Dr.McDowell's suggestion

## 2017-02-08 NOTE — Telephone Encounter (Signed)
She could try plain Claritin or Zyrtec. Would not use combination medicine with a decongestant such as pseudoephedrine.

## 2017-02-12 ENCOUNTER — Telehealth: Payer: Self-pay | Admitting: Cardiology

## 2017-02-12 ENCOUNTER — Other Ambulatory Visit: Payer: Self-pay | Admitting: Adult Health

## 2017-02-12 NOTE — Telephone Encounter (Signed)
Agree 

## 2017-02-12 NOTE — Telephone Encounter (Signed)
Per pt phone call - pt has started to get a cough w/ yellow mucous, she has been taking the Claritin since Thursday

## 2017-02-12 NOTE — Telephone Encounter (Signed)
No fever, suggested she see pcp,she agreed,I told her I would FYI you

## 2017-02-13 LAB — PULMONARY FUNCTION TEST
DL/VA % PRED: 51 %
DL/VA: 2.61 ml/min/mmHg/L
DLCO COR % PRED: 36 %
DLCO UNC: 9.96 ml/min/mmHg
DLCO cor: 9.96 ml/min/mmHg
DLCO unc % pred: 36 %
FEF 25-75 POST: 0.27 L/s
FEF 25-75 Pre: 0.25 L/sec
FEF2575-%CHANGE-POST: 9 %
FEF2575-%PRED-POST: 13 %
FEF2575-%Pred-Pre: 12 %
FEV1-%CHANGE-POST: 6 %
FEV1-%Pred-Post: 25 %
FEV1-%Pred-Pre: 23 %
FEV1-Post: 0.63 L
FEV1-Pre: 0.59 L
FEV1FVC-%CHANGE-POST: 5 %
FEV1FVC-%PRED-PRE: 48 %
FEV6-%Change-Post: 4 %
FEV6-%Pred-Post: 50 %
FEV6-%Pred-Pre: 47 %
FEV6-PRE: 1.5 L
FEV6-Post: 1.57 L
FEV6FVC-%Change-Post: 2 %
FEV6FVC-%PRED-PRE: 97 %
FEV6FVC-%Pred-Post: 100 %
FVC-%Change-Post: 1 %
FVC-%PRED-PRE: 49 %
FVC-%Pred-Post: 49 %
FVC-PRE: 1.61 L
FVC-Post: 1.63 L
POST FEV1/FVC RATIO: 39 %
PRE FEV6/FVC RATIO: 94 %
Post FEV6/FVC ratio: 97 %
Pre FEV1/FVC ratio: 37 %
RV % PRED: 203 %
RV: 4.61 L
TLC % pred: 134 %
TLC: 7.19 L

## 2017-02-21 ENCOUNTER — Other Ambulatory Visit: Payer: Self-pay | Admitting: Cardiology

## 2017-04-02 ENCOUNTER — Telehealth (HOSPITAL_COMMUNITY): Payer: Self-pay | Admitting: *Deleted

## 2017-04-02 ENCOUNTER — Emergency Department (HOSPITAL_COMMUNITY)
Admission: EM | Admit: 2017-04-02 | Discharge: 2017-04-02 | Disposition: A | Discharge status: Home / Self Care | Payer: Medicare Other | Attending: Emergency Medicine | Admitting: Emergency Medicine

## 2017-04-02 ENCOUNTER — Encounter (HOSPITAL_COMMUNITY): Payer: Self-pay | Admitting: Emergency Medicine

## 2017-04-02 ENCOUNTER — Emergency Department (HOSPITAL_COMMUNITY): Payer: Medicare Other

## 2017-04-02 DIAGNOSIS — J449 Chronic obstructive pulmonary disease, unspecified: Secondary | ICD-10-CM | POA: Insufficient documentation

## 2017-04-02 DIAGNOSIS — Z79899 Other long term (current) drug therapy: Secondary | ICD-10-CM | POA: Diagnosis not present

## 2017-04-02 DIAGNOSIS — I10 Essential (primary) hypertension: Secondary | ICD-10-CM | POA: Insufficient documentation

## 2017-04-02 DIAGNOSIS — Z87891 Personal history of nicotine dependence: Secondary | ICD-10-CM | POA: Diagnosis not present

## 2017-04-02 DIAGNOSIS — R002 Palpitations: Secondary | ICD-10-CM | POA: Diagnosis present

## 2017-04-02 DIAGNOSIS — I480 Paroxysmal atrial fibrillation: Secondary | ICD-10-CM | POA: Diagnosis not present

## 2017-04-02 LAB — BASIC METABOLIC PANEL
Anion gap: 9 (ref 5–15)
BUN: 19 mg/dL (ref 6–20)
CHLORIDE: 97 mmol/L — AB (ref 101–111)
CO2: 32 mmol/L (ref 22–32)
CREATININE: 1.21 mg/dL — AB (ref 0.44–1.00)
Calcium: 9.2 mg/dL (ref 8.9–10.3)
GFR calc Af Amer: 52 mL/min — ABNORMAL LOW (ref >60)
GFR calc non Af Amer: 45 mL/min — ABNORMAL LOW (ref >60)
GLUCOSE: 99 mg/dL (ref 65–99)
Potassium: 3.9 mmol/L (ref 3.5–5.1)
Sodium: 138 mmol/L (ref 135–145)

## 2017-04-02 LAB — CBC
HCT: 38 % (ref 36.0–46.0)
Hemoglobin: 12.3 g/dL (ref 12.0–15.0)
MCH: 30.9 pg (ref 26.0–34.0)
MCHC: 32.4 g/dL (ref 30.0–36.0)
MCV: 95.5 fL (ref 78.0–100.0)
PLATELETS: 136 10*3/uL — AB (ref 150–400)
RBC: 3.98 MIL/uL (ref 3.87–5.11)
RDW: 12.8 % (ref 11.5–15.5)
WBC: 5.9 10*3/uL (ref 4.0–10.5)

## 2017-04-02 LAB — TROPONIN I: Troponin I: <0.03 ng/mL (ref <0.03)

## 2017-04-02 LAB — MAGNESIUM: Magnesium: 1.8 mg/dL (ref 1.7–2.4)

## 2017-04-02 MED ORDER — ETOMIDATE 2 MG/ML IV SOLN
8.0000 mg | Freq: Once | INTRAVENOUS | Status: AC
  Administered 2017-04-02: 8 mg via INTRAVENOUS
  Filled 2017-04-02: qty 10

## 2017-04-02 NOTE — ED Provider Notes (Signed)
Blood pressure (!) 143/70, pulse (!) 56, temperature 98.3 F (36.8 C), temperature source Oral, resp. rate 15, height 5\' 6"  (1.676 m), weight 81.2 kg (179 lb), SpO2 100 %.  In short, Tracey Dudley is a 71 y.o. female with a chief complaint of Atrial Fibrillation .  Refer to the original H&P for additional details.  The current plan of care is to cardiovert to sinus rhythm under procedural sedation.   .Cardioversion Date/Time: 04/02/2017 1:34 PM Performed by: LONG, Arlyss RepressJOSHUA G Authorized by: Maia PlanLONG, JOSHUA G   Consent:    Consent obtained:  Written   Consent given by:  Patient   Risks discussed:  Cutaneous burn, death, induced arrhythmia and pain   Alternatives discussed:  No treatment and rate-control medication Pre-procedure details:    Cardioversion basis:  Emergent   Rhythm:  Atrial fibrillation   Electrode placement:  Anterior-posterior Attempt one:    Cardioversion mode:  Synchronous   Shock (Joules):  50   Shock outcome:  Conversion to normal sinus rhythm Post-procedure details:    Patient status:  Awake   Patient tolerance of procedure:  Tolerated well, no immediate complications .Sedation Date/Time: 04/02/2017 1:35 PM Performed by: LONG, Arlyss RepressJOSHUA G Authorized by: Maia PlanLONG, JOSHUA G   Consent:    Consent obtained:  Written   Consent given by:  Patient   Risks discussed:  Allergic reaction, prolonged hypoxia resulting in organ damage, prolonged sedation necessitating reversal, dysrhythmia, inadequate sedation, respiratory compromise necessitating ventilatory assistance and intubation, vomiting and nausea   Alternatives discussed:  Analgesia without sedation Universal protocol:    Procedure explained and questions answered to patient or proxy's satisfaction: yes     Relevant documents present and verified: yes     Test results available and properly labeled: yes     Imaging studies available: yes     Required blood products, implants, devices, and special equipment available: yes      Immediately prior to procedure a time out was called: yes     Patient identity confirmation method:  Hospital-assigned identification number, arm band and verbally with patient Indications:    Procedure performed:  Cardioversion   Procedure necessitating sedation performed by:  Physician performing sedation   Intended level of sedation:  Moderate (conscious sedation) Pre-sedation assessment:    Neck mobility: normal     Pre-sedation assessments completed and reviewed: airway patency, cardiovascular function, hydration status, mental status, nausea/vomiting, pain level and respiratory function     History of difficult intubation: no   Immediate pre-procedure details:    Reassessment: Patient reassessed immediately prior to procedure     Reviewed: vital signs and relevant labs/tests     Verified: bag valve mask available, emergency equipment available, intubation equipment available, IV patency confirmed, oxygen available and suction available   Procedure details (see MAR for exact dosages):    Sedation start time:  04/02/2017 12:07 PM   Preoxygenation:  Nasal cannula   Sedation:  Etomidate   Intra-procedure monitoring:  Blood pressure monitoring, continuous capnometry, frequent LOC assessments, frequent vital sign checks, continuous pulse oximetry and cardiac monitor   Intra-procedure events: none     Sedation end time:  04/02/2017 12:16 PM Post-procedure details:    Post-sedation assessments completed and reviewed: airway patency, cardiovascular function, hydration status, mental status, nausea/vomiting, pain level and respiratory function     Patient is stable for discharge or admission: yes     Patient tolerance:  Tolerated well, no immediate complications   Alona BeneJoshua Long, MD  Maia Plan, MD 04/02/17 [phone]

## 2017-04-02 NOTE — ED Triage Notes (Signed)
Patient states she has history of a-fib and noticed on her monitor at home that "I'm back in a-fib it started last night." Denies chest pain or shortness of breath.

## 2017-04-02 NOTE — ED Notes (Signed)
EKG delayed due to monitor cords were not working.  Cords had to be replaced

## 2017-04-02 NOTE — Telephone Encounter (Signed)
I cld pt to offer appt to afib clinic since a referral was put in from ED.  Pt declined appt stating that she will follow up with her own doctor and if he saw fit for her to come here, she would then schedule.

## 2017-04-02 NOTE — Discharge Instructions (Signed)
As discussed, return here if you have a recurrence of your symptoms.  Call your cardiologist for an office followup appointment.

## 2017-04-02 NOTE — ED Notes (Signed)
Pt has been drinking ginger ale with no problems.

## 2017-04-02 NOTE — ED Provider Notes (Signed)
[facility]Y DEPT Provider Note   CSN: [id number] Arrival date & time: 04/02/17  1191     History   Chief Complaint Chief Complaint  Patient presents with  . Atrial Fibrillation    HPI Tracey Dudley is a 71 y.o. female with a history of paroxysmal afib and COPD on chronic home oxygen presenting with an approximate 13 hour history of afib.  She states she went to respiratory therapy yesterday at which time she was sx free, then around 9 pm last night developed palpitations and the generalized jittery feeling that is associated with these episodes.  She denies increased sob and has had no chest pain.  She is currently on Xarelto, last dose taken yesterday evening. She also reports generalized headache also stating she did not sleep last night due to the uncomfortable feeling in her chest.  She has found no alleviators.  HPI  Past Medical History:  Diagnosis Date  . Anxiety   . COPD (chronic obstructive pulmonary disease) (HCC)   . Essential hypertension   . Herpes zoster   . Hyperlipidemia   . PAF (paroxysmal atrial fibrillation) (HCC)    Followed by Dr. Leonie Green in [place], also [facility] EP assessment by Dr. Christin Fudge  . Vitamin D deficiency     Patient Active Problem List   Diagnosis Date Noted  . CAP (community acquired pneumonia) 10/08/2015  . A-fib (HCC) 10/08/2015  . HTN (hypertension) 10/08/2015  . COPD with exacerbation (HCC) 10/08/2015    Past Surgical History:  Procedure Laterality Date  . ABDOMINAL HYSTERECTOMY    . CATARACT EXTRACTION      OB History    Gravida Para Term Preterm AB Living             1   SAB TAB Ectopic Multiple Live Births                   Home Medications    Prior to Admission medications   Medication Sig Start Date End Date Taking? Authorizing Provider  acetaminophen (TYLENOL) 500 MG tablet Take 500 mg by mouth every 6 (six) hours as needed for moderate pain.    Yes [provider]  albuterol (PROVENTIL HFA;VENTOLIN HFA)  108 (90 BASE) MCG/ACT inhaler Inhale 1 puff into the lungs every 6 (six) hours as needed for wheezing or shortness of breath.   Yes [provider]  ALPRAZolam (XANAX) 0.5 MG tablet Take 0.5-0.75 mg by mouth 3 (three) times daily as needed for anxiety.    Yes [provider]  Artificial Tear Solution (SOOTHE XP) SOLN Apply to eye.   Yes [provider]  atorvastatin (LIPITOR) 20 MG tablet Take 20 mg by mouth daily.   Yes [provider]  BYSTOLIC 20 MG TABS TAKE 1 TABLET BY MOUTH EVERY DAY 08/13/16  Yes Jonelle Sidle, MD  dofetilide (TIKOSYN) 250 MCG capsule TAKE ONE CAPSULE BY MOUTH EVERY 12 HOURS 12/25/16  Yes Jonelle Sidle, MD  EDARBI 80 MG TABS TAKE 1 TABLET BY MOUTH EVERY DAY 02/21/17  Yes Jonelle Sidle, MD  hydrALAZINE (APRESOLINE) 100 MG tablet TAKE 1 TABLET BY MOUTH 3 TIMES A DAY 02/21/17  Yes Jonelle Sidle, MD  rivaroxaban (XARELTO) 20 MG TABS tablet Take 20 mg by mouth daily with supper.   Yes [provider]  spironolactone (ALDACTONE) 25 MG tablet TAKE 1/2 TABLET BY MOUTH EVERY DAY 02/12/17  Yes Jodelle Gross, NP  TRELEGY ELLIPTA 100-62.5-25 MCG/INH AEPB INHALE 1 PUFF  BY MOUTH EVERY DAY 11/02/16  Yes [provider]    Family History Family History  Problem Relation Age of Onset  . Breast cancer Sister   . CAD Father   . Asthma Mother     Social History Social History  Substance Use Topics  . Smoking status: Former Smoker    Packs/day: 0.50    Years: 30.00    Types: Cigarettes    Start date: 11/03/1977    Quit date: 06/01/2014  . Smokeless tobacco: Never Used  . Alcohol use No     Allergies   Metoprolol and Erythromycin   Review of Systems Review of Systems  Constitutional: Negative for fever.  HENT: Negative for congestion and sore throat.   Eyes: Negative.   Respiratory: Positive for shortness of breath. Negative for chest tightness.   Cardiovascular: Positive for palpitations. Negative  for chest pain.  Gastrointestinal: Negative for abdominal pain, nausea and vomiting.  Genitourinary: Negative.   Musculoskeletal: Negative for arthralgias, joint swelling and neck pain.  Skin: Negative.  Negative for rash and wound.  Neurological: Positive for headaches. Negative for dizziness, weakness, light-headedness and numbness.  Psychiatric/Behavioral: Negative.      Physical Exam Updated Vital Signs BP 124/73   Pulse (!) 48   Temp 98.3 F (36.8 C) (Oral)   Resp 15   Ht 5\' 6"  (1.676 m)   Wt 81.2 kg (179 lb)   SpO2 100%   BMI 28.89 kg/m   Physical Exam  Constitutional: She appears well-developed and well-nourished.  HENT:  Head: Normocephalic and atraumatic.  Eyes: Conjunctivae are normal.  Cardiovascular: Normal heart sounds and intact distal pulses.  An irregularly irregular rhythm present.  Rate mid 90's to 115 on monitor.  Pulmonary/Chest: Effort normal and breath sounds normal. She has no wheezes.  Abdominal: Soft. Bowel sounds are normal. There is no tenderness.  Musculoskeletal: Normal range of motion.  Neurological: She is alert.  Skin: Skin is warm and dry.  Psychiatric: She has a normal mood and affect.  Nursing note and vitals reviewed.    ED Treatments / Results  Labs (all labs ordered are listed, but only abnormal results are displayed) Labs Reviewed  BASIC METABOLIC PANEL - Abnormal; Notable for the following:       Result Value   Chloride 97 (*)    Creatinine, Ser 1.21 (*)    GFR calc non Af Amer 45 (*)    GFR calc Af Amer 52 (*)    All other components within normal limits  CBC - Abnormal; Notable for the following:    Platelets 136 (*)    All other components within normal limits  TROPONIN I  MAGNESIUM    EKG  EKG Interpretation  Date/Time:  Tuesday April 02 2017 12:11:38 EDT Ventricular Rate:  54 PR Interval:    QRS Duration: 96 QT Interval:  454 QTC Calculation: 431 R Axis:   85 Text Interpretation:  Sinus rhythm Borderline  right axis deviation Probable anteroseptal infarct, old Baseline wander in lead(s) V1 No STEMI Confirmed by Alona BeneLong, Joshua [phone]([id number]) on 04/02/2017 12:44:41 PM       Radiology Dg Chest 2 View  Result Date: 04/02/2017 CLINICAL DATA:  Atrial fibrillation.  History of COPD. EXAM: CHEST  2 VIEW COMPARISON:  PA and lateral chest 11/29/2015. Single-view of the chest 11/12/2016. FINDINGS: The lungs are clear. Heart size is normal. No pneumothorax or pleural fluid. Aortic atherosclerosis noted. IMPRESSION: No acute disease. Atherosclerosis. Electronically Signed   By: Maisie Fushomas  Dalessio M.D.   On: 04/02/2017 11:29    Procedures Procedures (including critical care time)  Medications Ordered in ED Medications  etomidate (AMIDATE) injection 8 mg (8 mg Intravenous Given 04/02/17 1209)     Initial Impression / Assessment and Plan / ED Course  I have reviewed the triage vital signs and the nursing notes.  Pertinent labs & imaging results that were available during my care of the patient were reviewed by me and considered in my medical decision making (see chart for details).     Discussed with Dr. Purvis Sheffield who agrees that cardioversion is appropriate tx for this patient. Refer to Dr. Edwin Dada procedure note re elect. Cardioversion.  Pt observed in ed with no recurrence of sx.  Sinus rhythm on monitor at time of dc.   Final Clinical Impressions(s) / ED Diagnoses   Final diagnoses:  Paroxysmal atrial fibrillation [facility])    New Prescriptions New Prescriptions   No medications on file     Burgess Amor, Cordelia Poche 04/02/17 1418    Long, Arlyss Repress, MD 04/02/17 [id number]

## 2017-04-02 NOTE — ED Notes (Signed)
Pt speaking in full sentences, alert and oriented X4. Denies pain or fluttering at this time.

## 2017-04-16 ENCOUNTER — Ambulatory Visit (INDEPENDENT_AMBULATORY_CARE_PROVIDER_SITE_OTHER): Payer: Medicare Other | Admitting: Adult Health

## 2017-04-16 ENCOUNTER — Encounter: Payer: Self-pay | Admitting: Adult Health

## 2017-04-16 VITALS — BP 120/70 | HR 60 | Ht 66.0 in | Wt 176.0 lb

## 2017-04-16 DIAGNOSIS — I10 Essential (primary) hypertension: Secondary | ICD-10-CM

## 2017-04-16 DIAGNOSIS — I480 Paroxysmal atrial fibrillation: Secondary | ICD-10-CM

## 2017-04-16 DIAGNOSIS — J410 Simple chronic bronchitis: Secondary | ICD-10-CM

## 2017-04-16 DIAGNOSIS — I4891 Unspecified atrial fibrillation: Secondary | ICD-10-CM

## 2017-04-16 NOTE — Progress Notes (Signed)
Cardiology Office Note   Date:  04/16/2017   ID:  Tracey KindredLinda E Dudley, DOB [birth year], MRN [id number]  PCP:  Lenoria ChimeWhite, Valerie A, FNP  Cardiologist:  Diona BrownerMcDowell  Chief Complaint  Patient presents with  . Atrial Fibrillation    s/p DCCV      History of Present Illness: Tracey Dudley is a 71 y.o. female who presents for ongoing assessment and management of chronic atrial fibrillation. Was last seen by Dr. Diona BrownerMcDowell on 12/11/2016. At that time she was clinically stable continued on Xarelto for stroke prophylaxis, blood pressure was well-controlled.  Unfortunately, the patient presented to the emergency room on 04/02/2017 for palpitations, found to be in atrial fibrillation with RVR rates between 90 and 115 bpm. The patient underwent cardioversion in the emergency room. She remained in normal sinus rhythm on discharge from ER and is here on follow-up.  She states that she was at rest when she experienced the rapid HR. It lasted from around 9 pm to the next morning when she sought medical attention. She states that she completed pulmonary rehab every Monday which is the day it began, was not overly tired. She is symptomatic with atrial fib, with worsening breathing status and chest pressure.    Past Medical History:  Diagnosis Date  . Anxiety   . COPD (chronic obstructive pulmonary disease) (HCC)   . Essential hypertension   . Herpes zoster   . Hyperlipidemia   . PAF (paroxysmal atrial fibrillation) (HCC)    Followed by Dr. Leonie GreenLingle in [place], also [facility] EP assessment by Dr. Christin FudgeHegland  . Vitamin D deficiency     Past Surgical History:  Procedure Laterality Date  . ABDOMINAL HYSTERECTOMY    . CATARACT EXTRACTION       Current Outpatient Prescriptions  Medication Sig Dispense Refill  . acetaminophen (TYLENOL) 500 MG tablet Take 500 mg by mouth every 6 (six) hours as needed for moderate pain.     Marland Kitchen. albuterol (PROVENTIL HFA;VENTOLIN HFA) 108 (90 BASE) MCG/ACT inhaler Inhale 1 puff into the lungs  every 6 (six) hours as needed for wheezing or shortness of breath.    . ALPRAZolam (XANAX) 0.5 MG tablet Take 0.5-0.75 mg by mouth 3 (three) times daily as needed for anxiety.     . Artificial Tear Solution (SOOTHE XP) SOLN Apply to eye.    Marland Kitchen. atorvastatin (LIPITOR) 20 MG tablet Take 20 mg by mouth daily.    Marland Kitchen. BYSTOLIC 20 MG TABS TAKE 1 TABLET BY MOUTH EVERY DAY 30 tablet 10  . dofetilide (TIKOSYN) 250 MCG capsule TAKE ONE CAPSULE BY MOUTH EVERY 12 HOURS 60 capsule 10  . EDARBI 80 MG TABS TAKE 1 TABLET BY MOUTH EVERY DAY 30 tablet 6  . hydrALAZINE (APRESOLINE) 100 MG tablet TAKE 1 TABLET BY MOUTH 3 TIMES A DAY 90 tablet 3  . rivaroxaban (XARELTO) 20 MG TABS tablet Take 20 mg by mouth daily with supper.    Marland Kitchen. spironolactone (ALDACTONE) 25 MG tablet TAKE 1/2 TABLET BY MOUTH EVERY DAY 30 tablet 3  . TRELEGY ELLIPTA 100-62.5-25 MCG/INH AEPB INHALE 1 PUFF BY MOUTH EVERY DAY  0   No current facility-administered medications for this visit.     Allergies:   Metoprolol and Erythromycin    Social History:  The patient  reports that she quit smoking about 2 years ago. Her smoking use included Cigarettes. She started smoking about 39 years ago. She has a 15.00 pack-year smoking history. She has never used smokeless tobacco. She reports  that she does not drink alcohol or use drugs.   Family History:  The patient's family history includes Asthma in her mother; Breast cancer in her sister; CAD in her father.    ROS: All other systems are reviewed and negative. Unless otherwise mentioned in H&P    PHYSICAL EXAM: VS:  BP 120/70   Pulse 60   Ht 5\' 6"  (1.676 m)   Wt 176 lb (79.8 kg)   SpO2 93%   BMI 28.41 kg/m  , BMI Body mass index is 28.41 kg/m. GEN: Well nourished, well developed, in no acute distress  HEENT: normal  Neck: no JVD, carotid bruits, or masses Cardiac: RRR; no murmurs, rubs, or gallops,no edema  Respiratory: Some mld crackles, no wheezes. Wearing O2 2.liters.  GI: soft, nontender,  nondistended, + BS MS: no deformity or atrophy  Skin: warm and dry, no rash Neuro:  Strength and sensation are intact Psych: euthymic mood, full affect   EKG:   The ekg ordered today demonstrates NSR , sinus bradycardia. Rate of 59 bpm. PVC's.    Recent Labs: 04/02/2017: BUN 19; Creatinine, Ser 1.21; Hemoglobin 12.3; Magnesium 1.8; Platelets 136; Potassium 3.9; Sodium 138    Lipid Panel No results found for: CHOL, TRIG, HDL, CHOLHDL, VLDL, LDLCALC, LDLDIRECT    Wt Readings from Last 3 Encounters:  04/16/17 176 lb (79.8 kg)  04/02/17 179 lb (81.2 kg)  12/11/16 179 lb (81.2 kg)      Other studies Reviewed:  Echocardiogram 10/10/2015 Left ventricle: The cavity size was normal. Wall thickness was   increased in a pattern of mild LVH. Systolic function was   vigorous. The estimated ejection fraction was in the range of 75%   to 80%. Wall motion was normal; there were no regional wall   motion abnormalities. Features are consistent with a pseudonormal   left ventricular filling pattern, with concomitant abnormal   relaxation and increased filling pressure (grade 2 diastolic   dysfunction). - Aortic valve: Poorly visualized. Mildly calcified annulus.   Probably trileaflet. Mean gradient (S): 13 mm Hg. Peak gradient   (S): 24 mm Hg. Gradients likely increased due to relatively small   LVOT and vigorous contraction. Limited views of valve leaflets   show grossly preserved excursion. - Mitral valve: Mildly thickened leaflets . There was mild   regurgitation. - Left atrium: The atrium was at the upper limits of normal in   size. - Right atrium: Central venous pressure (est): 3 mm Hg. - Tricuspid valve: There was trivial regurgitation. - Pulmonary arteries: Systolic pressure could not be accurately   estimated. - Pericardium, extracardiac: A prominent pericardial fat pad was   present.  ASSESSMENT AND PLAN:  1.  Paroxysmal Atrial fib: Required DCCV in ER. Now in Sinus  bradycardia. She remains on Tikosyn and Bystolic. She is also on Xarelto. She is very symptomatic with this and is anxious about this recurring, or whether it is something she is doing.    I discussed this with Dr. Diona Browner who is her primary cardiologist. It is my opinion that she should be sent to be seen by electrophysiologist. He has reviewed her notes and EKGs, iand is in agreement that she would benefit from EP evaluation.   We will not make any medication changes at this time. She will be scheduled within the next couple of weeks to have evaluation in the [place] office.  2. O2 Dependent COPD: Continues with mild DOE. She remains committed to pulmonary rehab. Followed by pulmonary.  3. Hypertension: Currently well controlled.   Current medicines are reviewed at length with the patient today.    Labs/ tests ordered today include:  Bettey MareKathryn M. Liborio NixonLawrence DNP, ANP, AACC   04/16/2017 3:29 PM    [facility] [address], [place], [place] [address] Phone: [phone]; Fax: [phone]

## 2017-04-16 NOTE — Patient Instructions (Signed)
Medication Instructions:  Your physician recommends that you continue on your current medications as directed. Please refer to the Current Medication list given to you today.   Labwork: NONE  Testing/Procedures: NONE  Follow-Up: You have been referred to Dr. Ladona Ridgelaylor ( Electrophysiology)    Any Other Special Instructions Will Be Listed Below (If Applicable).     If you need a refill on your cardiac medications before your next appointment, please call your pharmacy.  Thank you for choosing [facility]!

## 2017-04-24 ENCOUNTER — Encounter (HOSPITAL_COMMUNITY): Payer: Self-pay | Admitting: Cardiology

## 2017-04-24 ENCOUNTER — Emergency Department (HOSPITAL_COMMUNITY): Payer: Medicare Other

## 2017-04-24 ENCOUNTER — Emergency Department (HOSPITAL_COMMUNITY)
Admission: EM | Admit: 2017-04-24 | Discharge: 2017-04-24 | Disposition: A | Discharge status: Home / Self Care | Payer: Medicare Other | Attending: Emergency Medicine | Admitting: Emergency Medicine

## 2017-04-24 DIAGNOSIS — I4891 Unspecified atrial fibrillation: Secondary | ICD-10-CM | POA: Diagnosis not present

## 2017-04-24 DIAGNOSIS — R002 Palpitations: Secondary | ICD-10-CM | POA: Diagnosis present

## 2017-04-24 DIAGNOSIS — I10 Essential (primary) hypertension: Secondary | ICD-10-CM | POA: Insufficient documentation

## 2017-04-24 DIAGNOSIS — J449 Chronic obstructive pulmonary disease, unspecified: Secondary | ICD-10-CM | POA: Insufficient documentation

## 2017-04-24 DIAGNOSIS — Z79899 Other long term (current) drug therapy: Secondary | ICD-10-CM | POA: Diagnosis not present

## 2017-04-24 DIAGNOSIS — F419 Anxiety disorder, unspecified: Secondary | ICD-10-CM | POA: Insufficient documentation

## 2017-04-24 DIAGNOSIS — Z87891 Personal history of nicotine dependence: Secondary | ICD-10-CM | POA: Diagnosis not present

## 2017-04-24 DIAGNOSIS — Z7901 Long term (current) use of anticoagulants: Secondary | ICD-10-CM | POA: Insufficient documentation

## 2017-04-24 LAB — CBC
HEMATOCRIT: 39.9 % (ref 36.0–46.0)
HEMOGLOBIN: 13.2 g/dL (ref 12.0–15.0)
MCH: 31.4 pg (ref 26.0–34.0)
MCHC: 33.1 g/dL (ref 30.0–36.0)
MCV: 94.8 fL (ref 78.0–100.0)
Platelets: 158 10*3/uL (ref 150–400)
RBC: 4.21 MIL/uL (ref 3.87–5.11)
RDW: 13.1 % (ref 11.5–15.5)
WBC: 8.1 10*3/uL (ref 4.0–10.5)

## 2017-04-24 LAB — URINALYSIS, ROUTINE W REFLEX MICROSCOPIC
BILIRUBIN URINE: NEGATIVE
Glucose, UA: NEGATIVE mg/dL
KETONES UR: NEGATIVE mg/dL
LEUKOCYTES UA: NEGATIVE
NITRITE: NEGATIVE
Protein, ur: NEGATIVE mg/dL
Specific Gravity, Urine: 1.004 — ABNORMAL LOW (ref 1.005–1.030)
pH: 8 (ref 5.0–8.0)

## 2017-04-24 LAB — BASIC METABOLIC PANEL
ANION GAP: 9 (ref 5–15)
BUN: 15 mg/dL (ref 6–20)
CALCIUM: 9.3 mg/dL (ref 8.9–10.3)
CO2: 32 mmol/L (ref 22–32)
Chloride: 97 mmol/L — ABNORMAL LOW (ref 101–111)
Creatinine, Ser: 1.26 mg/dL — ABNORMAL HIGH (ref 0.44–1.00)
GFR calc Af Amer: 49 mL/min — ABNORMAL LOW (ref >60)
GFR calc non Af Amer: 42 mL/min — ABNORMAL LOW (ref >60)
GLUCOSE: 123 mg/dL — AB (ref 65–99)
POTASSIUM: 3.6 mmol/L (ref 3.5–5.1)
Sodium: 138 mmol/L (ref 135–145)

## 2017-04-24 LAB — I-STAT TROPONIN, ED: Troponin i, poc: 0 ng/mL (ref 0.00–0.08)

## 2017-04-24 MED ORDER — DILTIAZEM HCL 25 MG/5ML IV SOLN
5.0000 mg | Freq: Once | INTRAVENOUS | Status: AC
  Administered 2017-04-24: 5 mg via INTRAVENOUS
  Filled 2017-04-24: qty 5

## 2017-04-24 MED ORDER — DILTIAZEM HCL 30 MG PO TABS: 30.0000 mg | ORAL_TABLET | Freq: Three times a day (TID) | ORAL | 0 refills | Status: DC | PRN

## 2017-04-24 MED ORDER — ONDANSETRON HCL 4 MG/2ML IJ SOLN
4.0000 mg | Freq: Once | INTRAMUSCULAR | Status: AC
  Administered 2017-04-24: 4 mg via INTRAVENOUS
  Filled 2017-04-24: qty 2

## 2017-04-24 MED ORDER — DILTIAZEM HCL 30 MG PO TABS
30.0000 mg | ORAL_TABLET | Freq: Once | ORAL | Status: AC
  Administered 2017-04-24: 30 mg via ORAL
  Filled 2017-04-24: qty 1

## 2017-04-24 MED ORDER — LORAZEPAM 2 MG/ML IJ SOLN
0.5000 mg | Freq: Once | INTRAMUSCULAR | Status: AC
  Administered 2017-04-24: 0.5 mg via INTRAVENOUS
  Filled 2017-04-24: qty 1

## 2017-04-24 NOTE — ED Triage Notes (Signed)
States she started feeling bad around 3 am.  C/o nausea.  States she thinks she is back in A-Fib.  States she has been cardioverted 3 times.  Denies any pain.

## 2017-04-24 NOTE — ED Notes (Signed)
ED Provider at bedside. 

## 2017-04-24 NOTE — Progress Notes (Signed)
Patient discussed with ER staff, chart reviewed. History of PAF. Multiple cardioversions including Feb 2018, she is on [place]. Seen in ER 04/02/17 with recurrent afib with RVR and repeat cardioversion at that time. She presents with recurrent afib and palpitations, rates mildly elevated in low 100s. I have recommended starting dilt 30mg  po q8rhs prn palpitations, and we will work to get her an earlier appt with EP to consider ablation. Does not appear with medical therapy and prior cardioversions we are able to maintain sinus rhytm.  Dina RichJonathan Barbar Brede MD

## 2017-04-25 ENCOUNTER — Telehealth: Payer: Self-pay | Admitting: Cardiology

## 2017-04-25 NOTE — Telephone Encounter (Signed)
Patient seen in ED yesterday and has f/u with Ladona Ridgelaylor on 05/06/17.  Please advise if patient should postpone cardiac rehab.

## 2017-04-25 NOTE — Telephone Encounter (Signed)
Patient contacted and advised that it was okay that she could postpone cardiac rehab until she is evaluated in the office.

## 2017-04-25 NOTE — Telephone Encounter (Signed)
Pt is still having problems w/ her Afib and is currently doing cardiac rehab, she would like to know if she should discontinue the cardiac rehab for a little while. She's had 2 cardioversion w/in 6 months. Please give her a call @ [phone]

## 2017-04-26 NOTE — Telephone Encounter (Signed)
I believe that she is enrolled in Pulmonary rehabilitation for COPD, not Cardiac rehabilitation. If she is having trouble with symptomatic PAF and would like to delay rehabilitation pending further EP evaluation, that is fine with me.

## 2017-04-28 NOTE — ED Provider Notes (Signed)
[facility]-EMERGENCY DEPT Provider Note   CSN: [id number] Arrival date & time: 04/24/17  0702     History   Chief Complaint Chief Complaint  Patient presents with  . Atrial Fibrillation    HPI Tracey Dudley is a 72 y.o. female.  HPI Patient presents with palpitations waking her around 3 AM. She has mild nausea and lightheadedness. Denies any chest pain. States she's been taking her medications as prescribed. She's had episodic atrial fibrillation with several cardioversions in the emergency department. She is scheduled to see Dr. Ladona Ridgel for ablation. Past Medical History:  Diagnosis Date  . Anxiety   . COPD (chronic obstructive pulmonary disease) (HCC)   . Essential hypertension   . Herpes zoster   . Hyperlipidemia   . PAF (paroxysmal atrial fibrillation) (HCC)    Followed by Dr. Leonie Green in [place], also [facility] EP assessment by Dr. Christin Fudge  . Vitamin D deficiency     Patient Active Problem List   Diagnosis Date Noted  . CAP (community acquired pneumonia) 10/08/2015  . A-fib (HCC) 10/08/2015  . HTN (hypertension) 10/08/2015  . COPD with exacerbation (HCC) 10/08/2015    Past Surgical History:  Procedure Laterality Date  . ABDOMINAL HYSTERECTOMY    . CATARACT EXTRACTION      OB History    Gravida Para Term Preterm AB Living             1   SAB TAB Ectopic Multiple Live Births                   Home Medications    Prior to Admission medications   Medication Sig Start Date End Date Taking? Authorizing Provider  acetaminophen (TYLENOL) 500 MG tablet Take 500 mg by mouth every 6 (six) hours as needed for moderate pain.    Yes [provider]  albuterol (PROVENTIL HFA;VENTOLIN HFA) 108 (90 BASE) MCG/ACT inhaler Inhale 1 puff into the lungs every 6 (six) hours as needed for wheezing or shortness of breath.   Yes [provider]  ALPRAZolam (XANAX) 0.5 MG tablet Take 0.5-0.75 mg by mouth 3 (three) times daily as needed for anxiety.    Yes [provider]  Artificial Tear Solution (SOOTHE XP) SOLN Apply to eye.   Yes [provider]  atorvastatin (LIPITOR) 20 MG tablet Take 20 mg by mouth daily.   Yes [provider]  BYSTOLIC 20 MG TABS TAKE 1 TABLET BY MOUTH EVERY DAY 08/13/16  Yes Jonelle Sidle, MD  dofetilide (TIKOSYN) 250 MCG capsule TAKE ONE CAPSULE BY MOUTH EVERY 12 HOURS 12/25/16  Yes Jonelle Sidle, MD  EDARBI 80 MG TABS TAKE 1 TABLET BY MOUTH EVERY DAY 02/21/17  Yes Jonelle Sidle, MD  hydrALAZINE (APRESOLINE) 100 MG tablet TAKE 1 TABLET BY MOUTH 3 TIMES A DAY 02/21/17  Yes Jonelle Sidle, MD  rivaroxaban (XARELTO) 20 MG TABS tablet Take 20 mg by mouth daily with supper.   Yes [provider]  spironolactone (ALDACTONE) 25 MG tablet TAKE 1/2 TABLET BY MOUTH EVERY DAY 02/12/17  Yes Jodelle Gross, NP  TRELEGY ELLIPTA 100-62.5-25 MCG/INH AEPB INHALE 1 PUFF BY MOUTH EVERY DAY 11/02/16  Yes [provider]  diltiazem (CARDIZEM) 30 MG tablet Take 1 tablet (30 mg total) by mouth 3 (three) times daily as needed (Palpitations). 04/24/17   Loren Racer, MD    Family History Family History  Problem Relation Age of Onset  . Breast cancer Sister   .  CAD Father   . Asthma Mother     Social History Social History  Substance Use Topics  . Smoking status: Former Smoker    Packs/day: 0.50    Years: 30.00    Types: Cigarettes    Start date: 11/03/1977    Quit date: 06/01/2014  . Smokeless tobacco: Never Used  . Alcohol use No     Allergies   Metoprolol and Erythromycin   Review of Systems Review of Systems  Constitutional: Negative for chills and fever.  Respiratory: Negative for cough and shortness of breath.   Cardiovascular: Positive for palpitations. Negative for chest pain and leg swelling.  Gastrointestinal: Positive for nausea. Negative for abdominal pain and vomiting.  Genitourinary: Negative for dysuria, flank pain and frequency.  Musculoskeletal: Negative  for back pain, myalgias, neck pain and neck stiffness.  Skin: Negative for rash and wound.  Neurological: Positive for light-headedness. Negative for dizziness, weakness, numbness and headaches.  Psychiatric/Behavioral: The patient is nervous/anxious.   All other systems reviewed and are negative.    Physical Exam Updated Vital Signs BP 135/76   Pulse 92   Temp 98.4 F (36.9 C) (Oral)   Resp 20   Ht 5\' 6"  (1.676 m)   Wt 80.7 kg (178 lb)   SpO2 100%   BMI 28.73 kg/m   Physical Exam  Constitutional: She is oriented to person, place, and time. She appears well-developed and well-nourished.  Anxious appearing  HENT:  Head: Normocephalic and atraumatic.  Mouth/Throat: Oropharynx is clear and moist.  Eyes: Pupils are equal, round, and reactive to light. EOM are normal.  Neck: Normal range of motion. Neck supple.  Cardiovascular: Regular rhythm.  Exam reveals no gallop and no friction rub.   No murmur heard. Irregularly irregular  Pulmonary/Chest: Effort normal and breath sounds normal. No respiratory distress. She has no wheezes. She has no rales. She exhibits no tenderness.  Abdominal: Soft. Bowel sounds are normal. There is no tenderness. There is no rebound and no guarding.  Musculoskeletal: Normal range of motion. She exhibits no edema or tenderness.  No lower extremity swelling, asymmetry or tenderness. Distal pulses are 2+.  Neurological: She is alert and oriented to person, place, and time.  Moving all extremities without focal deficit. Sensation fully intact.  Skin: Skin is warm and dry. Capillary refill takes less than 2 seconds. No rash noted. No erythema.  Psychiatric: Her behavior is normal.  Nursing note and vitals reviewed.    ED Treatments / Results  Labs (all labs ordered are listed, but only abnormal results are displayed) Labs Reviewed  BASIC METABOLIC PANEL - Abnormal; Notable for the following:       Result Value   Chloride 97 (*)    Glucose, Bld 123  (*)    Creatinine, Ser 1.26 (*)    GFR calc non Af Amer 42 (*)    GFR calc Af Amer 49 (*)    All other components within normal limits  URINALYSIS, ROUTINE W REFLEX MICROSCOPIC - Abnormal; Notable for the following:    Color, Urine STRAW (*)    Specific Gravity, Urine 1.004 (*)    Hgb urine dipstick MODERATE (*)    Bacteria, UA RARE (*)    Squamous Epithelial / LPF 0-5 (*)    All other components within normal limits  CBC  I-STAT TROPONIN, ED    EKG  EKG Interpretation  Date/Time:  Wednesday April 24 2017 09:19:00 EDT Ventricular Rate:  104 PR Interval:  QRS Duration: 92 QT Interval:  356 QTC Calculation: 469 R Axis:   82 Text Interpretation:  SR Borderline right axis deviation Probable anteroseptal infarct, old unchanged from previous same day. Confirmed by Arby BarrettePfeiffer, Marcy [id number]) on 04/25/2017 1:21:27 PM       Radiology No results found.  Procedures Procedures (including critical care time)  Medications Ordered in ED Medications  ondansetron (ZOFRAN) injection 4 mg (4 mg Intravenous Given 04/24/17 0747)  LORazepam (ATIVAN) injection 0.5 mg (0.5 mg Intravenous Given 04/24/17 0748)  diltiazem (CARDIZEM) injection 5 mg (5 mg Intravenous Given 04/24/17 0746)  diltiazem (CARDIZEM) tablet 30 mg (30 mg Oral Given 04/24/17 1101)     Initial Impression / Assessment and Plan / ED Course  I have reviewed the triage vital signs and the nursing notes.  Pertinent labs & imaging results that were available during my care of the patient were reviewed by me and considered in my medical decision making (see chart for details).     Given dose of diltiazem and Ativan in the emergency department. Heart rate has significantly improved. Patient states that she is feeling much better. Discussed with cardiology on-call. Advised to give prescription for by mouth Cardizem when necessary and will expedite outpatient follow-up with Dr. Ladona Ridgelaylor. Return precautions have been given.  Final Clinical  Impressions(s) / ED Diagnoses   Final diagnoses:  Atrial fibrillation with RVR (HCC)  Anxiety    New Prescriptions Discharge Medication List as of 04/24/2017 11:41 AM    START taking these medications   Details  diltiazem (CARDIZEM) 30 MG tablet Take 1 tablet (30 mg total) by mouth 3 (three) times daily as needed (Palpitations)., Starting Wed 04/24/2017, Print         Loren RacerYelverton, Naw Lasala, MD 04/28/17 [phone]

## 2017-05-06 ENCOUNTER — Ambulatory Visit (INDEPENDENT_AMBULATORY_CARE_PROVIDER_SITE_OTHER): Payer: Medicare Other | Admitting: Internal Medicine

## 2017-05-06 ENCOUNTER — Encounter: Payer: Self-pay | Admitting: Internal Medicine

## 2017-05-06 VITALS — BP 148/73 | HR 65 | Wt 178.0 lb

## 2017-05-06 DIAGNOSIS — J449 Chronic obstructive pulmonary disease, unspecified: Secondary | ICD-10-CM | POA: Diagnosis not present

## 2017-05-06 DIAGNOSIS — I480 Paroxysmal atrial fibrillation: Secondary | ICD-10-CM | POA: Diagnosis not present

## 2017-05-06 MED ORDER — DILTIAZEM HCL 30 MG PO TABS: 30.0000 mg | ORAL_TABLET | Freq: Three times a day (TID) | ORAL | 2 refills | Status: DC

## 2017-05-06 NOTE — Progress Notes (Signed)
HPI Tracey Dudley is referred today by Dr. Diona Browner for evaluation of atrial fibrillation. She is a very pleasant 71 year old woman with a history of COPD requiring oxygen. Remotely she has had tobacco abuse but stopped smoking 5 years ago. She has a history of recurrent pneumonia. She was seen by the [facility] electrophysiology team several years ago and placed on dofetilide. She is maintaining sinus rhythm fairly nicely until the last year. She has had 3 episodes of atrial fibrillation requiring hospitalization with subsequent cardioversion. When she goes into atrial fibrillation she is anxious and feels palpitations. She has some shortness of breath but no chest pain and no syncope. She denies medical noncompliance. The patient admits to being quite anxious about the possibility of her atrial fibrillation recurring. She has been on systemic anticoagulation with oral anticoagulants for several years. She continues on beta blocker therapy in conjunction with her dofetilide and has been given a prescription for Cardizem to take as needed for rate control when she goes into atrial fibrillation. The patient does not note increasing oxygen requirements. She is on 2 L. Allergies  Allergen Reactions  . Metoprolol Shortness Of Breath and Other (See Comments)    Tiredness  . Erythromycin Nausea Only     Current Outpatient Prescriptions  Medication Sig Dispense Refill  . acetaminophen (TYLENOL) 500 MG tablet Take 500 mg by mouth every 6 (six) hours as needed for moderate pain.     Marland Kitchen albuterol (PROVENTIL HFA;VENTOLIN HFA) 108 (90 BASE) MCG/ACT inhaler Inhale 1 puff into the lungs every 6 (six) hours as needed for wheezing or shortness of breath.    . ALPRAZolam (XANAX) 0.5 MG tablet Take 0.5-0.75 mg by mouth 3 (three) times daily as needed for anxiety.     . Artificial Tear Solution (SOOTHE XP) SOLN Apply to eye.    Marland Kitchen atorvastatin (LIPITOR) 20 MG tablet Take 20 mg by mouth daily.    Marland Kitchen BYSTOLIC 20 MG TABS  TAKE 1 TABLET BY MOUTH EVERY DAY 30 tablet 10  . diltiazem (CARDIZEM) 30 MG tablet Take 1 tablet (30 mg total) by mouth 3 (three) times daily as needed (Palpitations). 10 tablet 0  . dofetilide (TIKOSYN) 250 MCG capsule TAKE ONE CAPSULE BY MOUTH EVERY 12 HOURS 60 capsule 10  . EDARBI 80 MG TABS TAKE 1 TABLET BY MOUTH EVERY DAY 30 tablet 6  . hydrALAZINE (APRESOLINE) 100 MG tablet TAKE 1 TABLET BY MOUTH 3 TIMES A DAY 90 tablet 3  . rivaroxaban (XARELTO) 20 MG TABS tablet Take 20 mg by mouth daily with supper.    Marland Kitchen spironolactone (ALDACTONE) 25 MG tablet TAKE 1/2 TABLET BY MOUTH EVERY DAY 30 tablet 3  . TRELEGY ELLIPTA 100-62.5-25 MCG/INH AEPB INHALE 1 PUFF BY MOUTH EVERY DAY  0  . diltiazem (CARDIZEM) 30 MG tablet Take 1 tablet (30 mg total) by mouth 3 (three) times daily. PRN for AFIB 90 tablet 2   No current facility-administered medications for this visit.      Past Medical History:  Diagnosis Date  . Anxiety   . COPD (chronic obstructive pulmonary disease) (HCC)   . Essential hypertension   . Herpes zoster   . Hyperlipidemia   . PAF (paroxysmal atrial fibrillation) (HCC)    Followed by Dr. Leonie Green in [place], also [facility] EP assessment by Dr. Christin Fudge  . Vitamin D deficiency     ROS:   All systems reviewed and negative except as noted in the HPI.   Past Surgical  History:  Procedure Laterality Date  . ABDOMINAL HYSTERECTOMY    . CATARACT EXTRACTION       Family History  Problem Relation Age of Onset  . Breast cancer Sister   . CAD Father   . Asthma Mother      Social History   Social History  . Marital status: Married    Spouse name: N/A  . Number of children: N/A  . Years of education: N/A   Occupational History  . Not on file.   Social History Main Topics  . Smoking status: Former Smoker    Packs/day: 0.50    Years: 30.00    Types: Cigarettes    Start date: 11/03/1977    Quit date: 06/01/2014  . Smokeless tobacco: Never Used  . Alcohol use No  . Drug  use: No  . Sexual activity: Yes   Other Topics Concern  . Not on file   Social History Narrative  . No narrative on file     BP (!) 148/73   Pulse 65   Wt 178 lb (80.7 kg)   SpO2 97%   BMI 28.73 kg/m   Physical Exam: Chronically ill appearing 71 year old woman, NAD HEENT: Unremarkable Neck:  7 cm JVD, no thyromegally Lymphatics:  No adenopathy Back:  No CVA tenderness Lungs:  Clear with reduced breath sounds throughout. No wheezes. HEART:  Regular rate rhythm, no murmurs, no rubs, no clicks Abd:  soft, positive bowel sounds, no organomegally, no rebound, no guarding Ext:  2 plus pulses, no edema, no cyanosis, no clubbing Skin:  No rashes no nodules Neuro:  CN II through XII intact, motor grossly intact  EKG - reviewed. Atrial fibrillation with a rapid ventricular response earlier this month. None today.   Assess/Plan: 1. Paroxysmal atrial fibrillation - the patient has recurrence of atrial fibrillation on medical therapy. My experience is been that patients who have oxygen-dependent COPD typically do not do as well with catheter ablation therapy although I will discuss this further with my partners. We discussed the use of when necessary Cardizem in conjunction with her current medications for additional rate control, indications for hospitalization in atrial fibrillation recurs, as well as AV node ablation and insertion of a dual-chamber his bundle pacemaker. The patient has had 2 episodes requiring emergency room evaluation in the last month and a half. If her episodes continue to increase in frequency and severity, then AV node ablation and pacemaker insertion would be a consideration. 2. COPD - the patient requires oxygen therapy but appears to be fairly comfortable. She will continue bronchodilator therapy as needed. 3. Hypertension - she appears to be reasonably well controlled at this point. I discuss importance of a low-sodium diet.  Lewayne Bunting, M.D.

## 2017-05-06 NOTE — Patient Instructions (Addendum)
Medication Instructions:  Your physician has recommended you make the following change in your medication:  Diltiazem 30 mg as needed for AFIB    Labwork: NONE  Testing/Procedures: NONE   Follow-Up: Your physician recommends that you schedule a follow-up appointment in: 3 Months with Dr. Ladona Ridgel    Any Other Special Instructions Will Be Listed Below (If Applicable).  Patient may return to Pulmonary Rehab   If you need a refill on your cardiac medications before your next appointment, please call your pharmacy.

## 2017-06-07 ENCOUNTER — Institutional Professional Consult (permissible substitution): Payer: Medicare Other | Admitting: Internal Medicine

## 2017-06-10 NOTE — Progress Notes (Signed)
Cardiology Office Note  Date: 06/11/2017   ID: Tracey Dudley, DOB [birth year], MRN [id number]  PCP: Lenoria Chime, FNP  Primary Cardiologist: Nona Dell, MD   Chief Complaint  Patient presents with  . PAF    History of Present Illness: Tracey Dudley is a 71 y.o. female last seen by Ms. Lawrence NP in July and subsequently by Dr. Ladona Ridgel in August. She has had symptomatic paroxysmal atrial fibrillation and was seen by Dr. Ladona Ridgel to discuss treatment strategies. She has been managed with Cardizem, Bystolic, Tikosyn, and Xarelto.  She presents today reporting that she has had no breakthrough atrial fibrillation since she saw Dr. Ladona Ridgel. She does have short-acting Cardizem to take as needed. Otherwise, there has been no change in her medical regimen.   Past Medical History:  Diagnosis Date  . Anxiety   . COPD (chronic obstructive pulmonary disease) (HCC)   . Essential hypertension   . Herpes zoster   . Hyperlipidemia   . PAF (paroxysmal atrial fibrillation) (HCC)    Followed by Dr. Leonie Green in [place], also [facility] EP assessment by Dr. Christin Fudge  . Vitamin D deficiency     Past Surgical History:  Procedure Laterality Date  . ABDOMINAL HYSTERECTOMY    . CATARACT EXTRACTION      Current Outpatient Prescriptions  Medication Sig Dispense Refill  . acetaminophen (TYLENOL) 500 MG tablet Take 500 mg by mouth every 6 (six) hours as needed for moderate pain.     Marland Kitchen albuterol (PROVENTIL HFA;VENTOLIN HFA) 108 (90 BASE) MCG/ACT inhaler Inhale 1 puff into the lungs every 6 (six) hours as needed for wheezing or shortness of breath.    . ALPRAZolam (XANAX) 0.5 MG tablet Take 0.5-0.75 mg by mouth 3 (three) times daily as needed for anxiety.     . Artificial Tear Solution (SOOTHE XP) SOLN Apply to eye.    Marland Kitchen atorvastatin (LIPITOR) 20 MG tablet Take 20 mg by mouth daily.    Marland Kitchen BYSTOLIC 20 MG TABS TAKE 1 TABLET BY MOUTH EVERY DAY 30 tablet 10  . diltiazem (CARDIZEM) 30 MG tablet Take 1 tablet  (30 mg total) by mouth 3 (three) times daily as needed (Palpitations). 10 tablet 0  . diltiazem (CARDIZEM) 30 MG tablet Take 1 tablet (30 mg total) by mouth 3 (three) times daily. PRN for AFIB 90 tablet 2  . dofetilide (TIKOSYN) 250 MCG capsule TAKE ONE CAPSULE BY MOUTH EVERY 12 HOURS 60 capsule 10  . EDARBI 80 MG TABS TAKE 1 TABLET BY MOUTH EVERY DAY 30 tablet 6  . hydrALAZINE (APRESOLINE) 100 MG tablet TAKE 1 TABLET BY MOUTH 3 TIMES A DAY 90 tablet 3  . rivaroxaban (XARELTO) 20 MG TABS tablet Take 20 mg by mouth daily with supper.    Marland Kitchen spironolactone (ALDACTONE) 25 MG tablet TAKE 1/2 TABLET BY MOUTH EVERY DAY 30 tablet 3  . TRELEGY ELLIPTA 100-62.5-25 MCG/INH AEPB INHALE 1 PUFF BY MOUTH EVERY DAY  0   No current facility-administered medications for this visit.    Allergies:  Metoprolol and Erythromycin   Social History: The patient  reports that she quit smoking about 3 years ago. Her smoking use included Cigarettes. She started smoking about 39 years ago. She has a 15.00 pack-year smoking history. She has never used smokeless tobacco. She reports that she does not drink alcohol or use drugs.   ROS:  Please see the history of present illness. Otherwise, complete review of systems is positive for intermittent arthritic knee  pain and stiffness.  All other systems are reviewed and negative.   Physical Exam: VS:  BP (!) 152/78   Pulse (!) 57   Ht  (1.676 m)   Wt 180 lb (81.6 kg)   SpO2 97%   BMI 29.05 kg/m , BMI Body mass index is 29.05 kg/m.  Wt Readings from Last 3 Encounters:  06/11/17 180 lb (81.6 kg)  05/06/17 178 lb (80.7 kg)  04/24/17 178 lb (80.7 kg)    General: Chronically ill-appearing woman, no distress. HEENT: Conjunctiva and lids normal, oropharynx clear. Neck: Supple, no elevated JVP or carotid bruits, no thyromegaly. Lungs: Decreased breath sounds throughout without wheezing, nonlabored breathing at rest. Cardiac: Regular rate and rhythm, no S3 or significant  systolic murmur, no pericardial rub. Abdomen: Soft, nontender, bowel sounds present. Extremities: No pitting edema, distal pulses 2+.  ECG: I personally reviewed the tracing from 04/24/2017 which showed atrial fibrillation with PVC and nonspecific ST changes.  Recent Labwork: 04/02/2017: Magnesium 1.8 04/24/2017: BUN 15; Creatinine, Ser 1.26; Hemoglobin 13.2; Platelets 158; Potassium 3.6; Sodium 138   Other Studies Reviewed Today:  Echocardiogram 10/10/2015: Study Conclusions  - Left ventricle: The cavity size was normal. Wall thickness was   increased in a pattern of mild LVH. Systolic function was   vigorous. The estimated ejection fraction was in the range of 75%   to 80%. Wall motion was normal; there were no regional wall   motion abnormalities. Features are consistent with a pseudonormal   left ventricular filling pattern, with concomitant abnormal   relaxation and increased filling pressure (grade 2 diastolic   dysfunction). - Aortic valve: Poorly visualized. Mildly calcified annulus.   Probably trileaflet. Mean gradient (S): 13 mm Hg. Peak gradient   (S): 24 mm Hg. Gradients likely increased due to relatively small   LVOT and vigorous contraction. Limited views of valve leaflets   show grossly preserved excursion. - Mitral valve: Mildly thickened leaflets . There was mild   regurgitation. - Left atrium: The atrium was at the upper limits of normal in   size. - Right atrium: Central venous pressure (est): 3 mm Hg. - Tricuspid valve: There was trivial regurgitation. - Pulmonary arteries: Systolic pressure could not be accurately   estimated. - Pericardium, extracardiac: A prominent pericardial fat pad was   present.  Impressions:  - Mild LVH with LVEF 75-80% and grade 2 diastolic dysfunction with   increased filling pressures. Normal left atrial chamber size.   Mildly thickened mitral leaflets with mild mitral regurgitation.   Aortic valve appears mildly sclerotic  without definitive   stenosis. Trivial tricuspid regurgitation, PASP not estimated.  Assessment and Plan:  1. Symptomatic paroxysmal atrial fibrillation. She has been evaluated by Dr. Ladona Ridgel, recommendations reviewed. For now she is stable on medical therapy which includes Xarelto, Tikosyn, Bystolic, and short acting Cardizem. Plan to continue observation. If symptoms escalate despite adequate medical therapy, may be a candidate for AV node ablation and pacemaker placement.  2. Oxygen-dependent COPD. She participate in pulmonary rehabilitation.  Current medicines were reviewed with the patient today.   Orders Placed This Encounter  Procedures  . Flu Vaccine QUAD 36+ mos IM    Disposition: Follow-up in 6 months.  Signed, Jonelle Sidle, MD, [facility] 06/11/2017 12:02 PM    [facility] at [facility] [address] [place], [place] [address] Phone: [phone]; Fax: [phone]

## 2017-06-11 ENCOUNTER — Encounter: Payer: Self-pay | Admitting: Cardiology

## 2017-06-11 ENCOUNTER — Ambulatory Visit (INDEPENDENT_AMBULATORY_CARE_PROVIDER_SITE_OTHER): Payer: Medicare Other | Admitting: Cardiology

## 2017-06-11 VITALS — BP 152/78 | HR 57 | Ht 66.0 in | Wt 180.0 lb

## 2017-06-11 DIAGNOSIS — Z23 Encounter for immunization: Secondary | ICD-10-CM | POA: Diagnosis not present

## 2017-06-11 DIAGNOSIS — I480 Paroxysmal atrial fibrillation: Secondary | ICD-10-CM

## 2017-06-11 DIAGNOSIS — J449 Chronic obstructive pulmonary disease, unspecified: Secondary | ICD-10-CM | POA: Diagnosis not present

## 2017-06-11 NOTE — Patient Instructions (Signed)
Your physician wants you to follow-up in: 6 months with Dr.McDowell You will receive a reminder letter in the mail two months in advance. If you don't receive a letter, please call our office to schedule the follow-up appointment.   Your physician recommends that you continue on your current medications as directed. Please refer to the Current Medication list given to you today.   If you need a refill on your cardiac medications before your next appointment, please call your pharmacy.     No labs or testing ordered today.       Thank you for choosing [facility] !        

## 2017-06-16 ENCOUNTER — Other Ambulatory Visit: Payer: Self-pay | Admitting: Cardiology

## 2017-08-15 ENCOUNTER — Ambulatory Visit: Payer: Medicare Other | Admitting: Internal Medicine

## 2017-08-15 ENCOUNTER — Encounter: Payer: Self-pay | Admitting: Internal Medicine

## 2017-08-15 VITALS — BP 128/72 | HR 57 | Ht 66.5 in | Wt 178.0 lb

## 2017-08-15 DIAGNOSIS — I480 Paroxysmal atrial fibrillation: Secondary | ICD-10-CM

## 2017-08-15 NOTE — Patient Instructions (Signed)
Medication Instructions:  Your physician recommends that you continue on your current medications as directed. Please refer to the Current Medication list given to you today.   Labwork: NONE   Testing/Procedures: NONE   Follow-Up: Your physician recommends that you schedule a follow-up appointment in: 3 Month with Dr. Ladona Ridgelaylor.   Any Other Special Instructions Will Be Listed Below (If Applicable).     If you need a refill on your cardiac medications before your next appointment, please call your pharmacy.  Thank you for choosing [facility]!

## 2017-08-15 NOTE — Progress Notes (Signed)
HPI Tracey Dudley returns today for ongoing evaluation and management of her atrial fibrillation. She has a long h/o PAF and has been reasonably well controlled with dofetilide. I saw her about 3 months ago as she was having some break through episodes of atrial fib. She was instructed to take as needed short acting diltiazem if her spells returned. Since then she has gone out of rhythm only once and she took her diltiazem over 2 days and her symptoms improved. She has felt well otherwise although she still has to wear oxygen by nasal canula. No chest pain.  Allergies  Allergen Reactions  . Metoprolol Shortness Of Breath and Other (See Comments)    Tiredness  . Erythromycin Nausea Only     Current Outpatient Medications  Medication Sig Dispense Refill  . acetaminophen (TYLENOL) 500 MG tablet Take 500 mg by mouth every 6 (six) hours as needed for moderate pain.     Marland Kitchen. albuterol (PROVENTIL HFA;VENTOLIN HFA) 108 (90 BASE) MCG/ACT inhaler Inhale 1 puff into the lungs every 6 (six) hours as needed for wheezing or shortness of breath.    . ALPRAZolam (XANAX) 0.5 MG tablet Take 0.5-0.75 mg by mouth 3 (three) times daily as needed for anxiety.     . Artificial Tear Solution (SOOTHE XP) SOLN Apply to eye.    Marland Kitchen. atorvastatin (LIPITOR) 20 MG tablet Take 20 mg by mouth daily.    Marland Kitchen. BYSTOLIC 20 MG TABS TAKE 1 TABLET BY MOUTH EVERY DAY 30 tablet 6  . diltiazem (CARDIZEM) 30 MG tablet Take 1 tablet (30 mg total) by mouth 3 (three) times daily as needed (Palpitations). 10 tablet 0  . diltiazem (CARDIZEM) 30 MG tablet Take 1 tablet (30 mg total) by mouth 3 (three) times daily. PRN for AFIB 90 tablet 2  . dofetilide (TIKOSYN) 250 MCG capsule TAKE ONE CAPSULE BY MOUTH EVERY 12 HOURS 60 capsule 10  . EDARBI 80 MG TABS TAKE 1 TABLET BY MOUTH EVERY DAY 30 tablet 6  . hydrALAZINE (APRESOLINE) 100 MG tablet TAKE 1 TABLET BY MOUTH 3 TIMES A DAY 90 tablet 3  . rivaroxaban (XARELTO) 20 MG TABS tablet Take 20 mg by  mouth daily with supper.    Marland Kitchen. spironolactone (ALDACTONE) 25 MG tablet TAKE 1/2 TABLET BY MOUTH EVERY DAY 30 tablet 3  . TRELEGY ELLIPTA 100-62.5-25 MCG/INH AEPB INHALE 1 PUFF BY MOUTH EVERY DAY  0   No current facility-administered medications for this visit.      Past Medical History:  Diagnosis Date  . Anxiety   . COPD (chronic obstructive pulmonary disease) (HCC)   . Essential hypertension   . Herpes zoster   . Hyperlipidemia   . PAF (paroxysmal atrial fibrillation) (HCC)    Follow[place] by Dr. Leonie [place]Lingle in [place], also [facility] assessment by Dr. Christin FudgeHegland  . Vitamin D deficiency     ROS:   All systems reviewed and negative except as noted in the HPI.   Past Surgical History:  Procedure Laterality Date  . ABDOMINAL HYSTERECTOMY    . CATARACT EXTRACTION       Family History  Problem Relation Age of Onset  . Breast cancer Sister   . CAD Father   . Asthma Mother      Social History   Socioeconomic History  . Marital status: Married    Spouse name: Not on file  . Number of children: Not on file  . Years of education: Not on file  .  Highest education level: Not on file  Social Needs  . Financial resource strain: Not on file  . Food insecurity - worry: Not on file  . Food insecurity - inability: Not on file  . Transportation needs - medical: Not on file  . Transportation needs - non-medical: Not on file  Occupational History  . Not on file  Tobacco Use  . Smoking status: Former Smoker    Packs/day: 0.50    Years: 30.00    Pack years: 15.00    Types: Cigarettes    Start date: 11/03/1977    Last attempt to quit: 06/01/2014    Years since quitting: 3.2  . Smokeless tobacco: Never Used  Substance and Sexual Activity  . Alcohol use: No    Alcohol/week: 0.0 oz  . Drug use: No  . Sexual activity: Yes  Other Topics Concern  . Not on file  Social History Narrative  . Not on file     BP 128/72 (BP Location: Left Arm)   Pulse (!) 57   Ht 5' 6.5" (1.689 m)   Wt  178 lb (80.7 kg)   SpO2 94%   BMI 28.30 kg/m   Physical Exam:  Well appearing 71 yo woman, NAD HEENT: Unremarkable Neck:  6 cm JVD, no thyromegally wearing nasal canula Lymphatics:  No adenopathy Back:  No CVA tenderness Lungs:  Clear with no wheezes HEART:  Regular rate rhythm, no murmurs, no rubs, no clicks Abd:  soft, positive bowel sounds, no organomegally, no rebound, no guarding Ext:  2 plus pulses, no edema, no cyanosis, no clubbing Skin:  No rashes no nodules Neuro:  CN II through XII intact, motor grossly intact  Assess/Plan: 1. PAF - she will continue her dofetilide. I discussed the taking of short acting cardizem. I have previously discussed AV node ablation and PPM insertion. I do not think that this procedure is indicated at the present time.  2. HTN - Her blood pressure is well controlled. She will continue her current meds. 3. COPD - She will continue her current meds.   Leonia Reeves.D.

## 2017-08-28 ENCOUNTER — Other Ambulatory Visit: Payer: Self-pay | Admitting: Cardiology

## 2017-09-26 ENCOUNTER — Telehealth: Payer: Self-pay | Admitting: Internal Medicine

## 2017-09-26 NOTE — Telephone Encounter (Signed)
If she is feeling ok, then she can continue with rehab. Might not want to push quite as hard.

## 2017-09-26 NOTE — Telephone Encounter (Signed)
Will forward to Dr. Taylor to advise.  

## 2017-09-26 NOTE — Telephone Encounter (Signed)
Pt is in pulmonary rehab and she went into afib yesterday and she hasn't converted back to normal rhythm yet, would like to know if she needs to be in normal rhythm before she can go back. Please give pt a call @ [phone]

## 2017-09-27 ENCOUNTER — Emergency Department (HOSPITAL_COMMUNITY)
Admission: EM | Admit: 2017-09-27 | Discharge: 2017-09-27 | Disposition: A | Discharge status: Home / Self Care | Payer: Medicare Other | Attending: Emergency Medicine | Admitting: Emergency Medicine

## 2017-09-27 ENCOUNTER — Encounter (HOSPITAL_COMMUNITY): Payer: Self-pay | Admitting: Cardiology

## 2017-09-27 DIAGNOSIS — R000 Tachycardia, unspecified: Secondary | ICD-10-CM | POA: Diagnosis not present

## 2017-09-27 DIAGNOSIS — I10 Essential (primary) hypertension: Secondary | ICD-10-CM | POA: Diagnosis not present

## 2017-09-27 DIAGNOSIS — Z87891 Personal history of nicotine dependence: Secondary | ICD-10-CM | POA: Insufficient documentation

## 2017-09-27 DIAGNOSIS — R002 Palpitations: Secondary | ICD-10-CM | POA: Diagnosis present

## 2017-09-27 DIAGNOSIS — Z8679 Personal history of other diseases of the circulatory system: Secondary | ICD-10-CM | POA: Insufficient documentation

## 2017-09-27 DIAGNOSIS — J449 Chronic obstructive pulmonary disease, unspecified: Secondary | ICD-10-CM | POA: Insufficient documentation

## 2017-09-27 DIAGNOSIS — I480 Paroxysmal atrial fibrillation: Secondary | ICD-10-CM | POA: Diagnosis not present

## 2017-09-27 DIAGNOSIS — Z79899 Other long term (current) drug therapy: Secondary | ICD-10-CM | POA: Diagnosis not present

## 2017-09-27 DIAGNOSIS — Z7901 Long term (current) use of anticoagulants: Secondary | ICD-10-CM | POA: Diagnosis not present

## 2017-09-27 LAB — BASIC METABOLIC PANEL
ANION GAP: 10 (ref 5–15)
BUN: 21 mg/dL — ABNORMAL HIGH (ref 6–20)
CALCIUM: 9 mg/dL (ref 8.9–10.3)
CO2: 30 mmol/L (ref 22–32)
Chloride: 97 mmol/L — ABNORMAL LOW (ref 101–111)
Creatinine, Ser: 1.43 mg/dL — ABNORMAL HIGH (ref 0.44–1.00)
GFR, EST AFRICAN AMERICAN: 42 mL/min — AB (ref >60)
GFR, EST NON AFRICAN AMERICAN: 36 mL/min — AB (ref >60)
Glucose, Bld: 93 mg/dL (ref 65–99)
POTASSIUM: 4.3 mmol/L (ref 3.5–5.1)
SODIUM: 137 mmol/L (ref 135–145)

## 2017-09-27 LAB — CBC WITH DIFFERENTIAL/PLATELET
BASOS ABS: 0 10*3/uL (ref 0.0–0.1)
BASOS PCT: 0 %
EOS ABS: 0.1 10*3/uL (ref 0.0–0.7)
EOS PCT: 2 %
HCT: 37.9 % (ref 36.0–46.0)
Hemoglobin: 11.9 g/dL — ABNORMAL LOW (ref 12.0–15.0)
Lymphocytes Relative: 16 %
Lymphs Abs: 1 10*3/uL (ref 0.7–4.0)
MCH: 30.6 pg (ref 26.0–34.0)
MCHC: 31.4 g/dL (ref 30.0–36.0)
MCV: 97.4 fL (ref 78.0–100.0)
MONO ABS: 0.6 10*3/uL (ref 0.1–1.0)
Monocytes Relative: 10 %
NEUTROS PCT: 72 %
Neutro Abs: 4.7 10*3/uL (ref 1.7–7.7)
PLATELETS: 141 10*3/uL — AB (ref 150–400)
RBC: 3.89 MIL/uL (ref 3.87–5.11)
RDW: 13.6 % (ref 11.5–15.5)
WBC: 6.5 10*3/uL (ref 4.0–10.5)

## 2017-09-27 MED ORDER — ETOMIDATE 2 MG/ML IV SOLN
15.0000 mg | Freq: Once | INTRAVENOUS | Status: AC
  Administered 2017-09-27: 15 mg via INTRAVENOUS
  Filled 2017-09-27: qty 10

## 2017-09-27 MED ORDER — ONDANSETRON HCL 4 MG/2ML IJ SOLN
INTRAMUSCULAR | Status: AC
  Filled 2017-09-27: qty 2

## 2017-09-27 MED ORDER — ONDANSETRON HCL 4 MG/2ML IJ SOLN
4.0000 mg | Freq: Once | INTRAMUSCULAR | Status: AC
  Administered 2017-09-27: 4 mg via INTRAVENOUS

## 2017-09-27 MED ORDER — FENTANYL CITRATE (PF) 100 MCG/2ML IJ SOLN
25.0000 ug | INTRAMUSCULAR | Status: DC | PRN
  Administered 2017-09-27: 25 ug via INTRAVENOUS
  Filled 2017-09-27: qty 2

## 2017-09-27 MED ORDER — SODIUM CHLORIDE 0.9 % IV SOLN
INTRAVENOUS | Status: DC
  Administered 2017-09-27: 125 mL/h via INTRAVENOUS

## 2017-09-27 NOTE — ED Notes (Addendum)
Pt spitting out oral airway, talking, alert and oriented

## 2017-09-27 NOTE — Discharge Instructions (Signed)
Follow up with cardiology next week, continue blood thinners and heart medicines.

## 2017-09-27 NOTE — ED Notes (Signed)
Pt complaining of nausea, EDP aware

## 2017-09-27 NOTE — ED Notes (Signed)
Synchronized shock at  150 joules.  NSR after shock

## 2017-09-27 NOTE — ED Notes (Signed)
Pt sasts dropped to upper 80's.  Jaw thrust to open airway.  Preparing to bag pt.

## 2017-09-27 NOTE — ED Notes (Signed)
Patient given discharge instruction, verbalized understand. IV removed, band aid applied. Patient ambulatory out of the department.  

## 2017-09-27 NOTE — ED Notes (Signed)
sats up with jaw thrust .  Pt responding to sternal rub.

## 2017-09-27 NOTE — ED Provider Notes (Signed)
[facility] EMERGENCY DEPARTMENT Provider Note   CSN: [id number] Arrival date & time: 09/27/17  1122     History   Chief Complaint Chief Complaint  Patient presents with  . Atrial Fibrillation    HPI Tracey Dudley is a 72 y.o. female.  Patient with history of COPD, atrial fibrillation, cardioversion, follows with Dr. Ladona Ridgel and Dr. Diona Browner presents feeling palpitations for the past 2 and half days. This feels similar to atrophic ablation history. Patient's compliant with her medications including anticoagulant. Patient has no chest pain or shortness of breath. Patient has taken her diltiazem however she remains in atrial fibrillation. Patient was told to come to the ER.      Past Medical History:  Diagnosis Date  . Anxiety   . COPD (chronic obstructive pulmonary disease) (HCC)   . Essential hypertension   . Herpes zoster   . Hyperlipidemia   . PAF (paroxysmal atrial fibrillation) (HCC)    Followed by Dr. Leonie Green in [place], also [facility] EP assessment by Dr. Christin Fudge  . Vitamin D deficiency     Patient Active Problem List   Diagnosis Date Noted  . CAP (community acquired pneumonia) 10/08/2015  . A-fib (HCC) 10/08/2015  . HTN (hypertension) 10/08/2015  . COPD with exacerbation (HCC) 10/08/2015    Past Surgical History:  Procedure Laterality Date  . ABDOMINAL HYSTERECTOMY    . CATARACT EXTRACTION      OB History    Gravida Para Term Preterm AB Living             1   SAB TAB Ectopic Multiple Live Births                   Home Medications    Prior to Admission medications   Medication Sig Start Date End Date Taking? Authorizing Provider  acetaminophen (TYLENOL) 500 MG tablet Take 500 mg by mouth every 6 (six) hours as needed for moderate pain.    Yes [provider]  albuterol (PROVENTIL HFA;VENTOLIN HFA) 108 (90 BASE) MCG/ACT inhaler Inhale 1 puff into the lungs every 6 (six) hours as needed for wheezing or shortness of breath.   Yes [provider]  ALPRAZolam (XANAX) 0.5 MG tablet Take 0.5-0.75 mg by mouth 3 (three) times daily as needed for anxiety.    Yes [provider]  Artificial Tear Solution (SOOTHE XP) SOLN Place 1-2 drops into both eyes daily as needed (dry eyes).    Yes [provider]  atorvastatin (LIPITOR) 20 MG tablet Take 20 mg by mouth daily.   Yes [provider]  BYSTOLIC 20 MG TABS TAKE 1 TABLET BY MOUTH EVERY DAY 06/17/17  Yes Jonelle Sidle, MD  diltiazem (CARDIZEM) 30 MG tablet Take 1 tablet (30 mg total) by mouth 3 (three) times daily. PRN for AFIB 05/06/17  Yes Marinus Maw, MD  dofetilide (TIKOSYN) 250 MCG capsule TAKE ONE CAPSULE BY MOUTH EVERY 12 HOURS 12/25/16  Yes Jonelle Sidle, MD  EDARBI 80 MG TABS TAKE 1 TABLET BY MOUTH EVERY DAY 08/28/17  Yes Jonelle Sidle, MD  hydrALAZINE (APRESOLINE) 100 MG tablet TAKE 1 TABLET BY MOUTH 3 TIMES A DAY 06/17/17  Yes Jonelle Sidle, MD  rivaroxaban (XARELTO) 20 MG TABS tablet Take 20 mg by mouth daily with supper.   Yes [provider]  spironolactone (ALDACTONE) 25 MG tablet TAKE 1/2 TABLET BY MOUTH EVERY DAY Patient taking differently: TAKE 1/2 TABLET (12.5mg ) BY MOUTH EVERY DAY 02/12/17  Yes Jodelle Gross, NP  TRELEGY ELLIPTA 100-62.5-25 MCG/INH AEPB INHALE 1 PUFF BY MOUTH EVERY DAY 11/02/16  Yes [provider]    Family History Family History  Problem Relation Age of Onset  . Breast cancer Sister   . CAD Father   . Asthma Mother     Social History Social History   Tobacco Use  . Smoking status: Former Smoker    Packs/day: 0.50    Years: 30.00    Pack years: 15.00    Types: Cigarettes    Start date: 11/03/1977    Last attempt to quit: 06/01/2014    Years since quitting: 3.3  . Smokeless tobacco: Never Used  Substance Use Topics  . Alcohol use: No    Alcohol/week: 0.0 oz  . Drug use: No     Allergies   Metoprolol and Erythromycin   Review of Systems Review of Systems   Constitutional: Negative for chills and fever.  HENT: Negative for congestion.   Eyes: Negative for visual disturbance.  Respiratory: Negative for shortness of breath.   Cardiovascular: Positive for palpitations. Negative for chest pain.  Gastrointestinal: Negative for abdominal pain and vomiting.  Genitourinary: Negative for dysuria and flank pain.  Musculoskeletal: Negative for back pain, neck pain and neck stiffness.  Skin: Negative for rash.  Neurological: Negative for light-headedness and headaches.     Physical Exam Updated Vital Signs BP (!) 148/60   Pulse 73   Temp 98.2 F (36.8 C) (Oral)   Resp 19   Ht 5\' 6"  (1.676 m)   Wt 80.7 kg (178 lb)   SpO2 98%   BMI 28.73 kg/m   Physical Exam  Constitutional: She is oriented to person, place, and time. She appears well-developed and well-nourished.  HENT:  Head: Normocephalic and atraumatic.  Eyes: Conjunctivae are normal. Right eye exhibits no discharge. Left eye exhibits no discharge.  Neck: Normal range of motion. Neck supple. No tracheal deviation present.  Cardiovascular: An irregularly irregular rhythm present. Tachycardia present.  Pulmonary/Chest: Effort normal and breath sounds normal.  Abdominal: Soft. She exhibits no distension. There is no tenderness. There is no guarding.  Musculoskeletal: She exhibits no edema.  Neurological: She is alert and oriented to person, place, and time.  Skin: Skin is warm. No rash noted.  Psychiatric: She has a normal mood and affect.  Nursing note and vitals reviewed.    ED Treatments / Results  Labs (all labs ordered are listed, but only abnormal results are displayed) Labs Reviewed  CBC WITH DIFFERENTIAL/PLATELET - Abnormal; Notable for the following components:      Result Value   Hemoglobin 11.9 (*)    Platelets 141 (*)    All other components within normal limits  BASIC METABOLIC PANEL - Abnormal; Notable for the following components:   Chloride 97 (*)    BUN 21 (*)     Creatinine, Ser 1.43 (*)    GFR calc non Af Amer 36 (*)    GFR calc Af Amer 42 (*)    All other components within normal limits    EKG  EKG Interpretation  Date/Time:  Friday September 27 2017 14:08:39 EST Ventricular Rate:  60 PR Interval:    QRS Duration: 95 QT Interval:  431 QTC Calculation: 431 R Axis:   78 Text Interpretation:  Sinus rhythm Atrial premature complex Confirmed by Blane Ohara [phone]) on 09/27/2017 3:28:02 PM       Radiology No results found.  Procedures .Cardioversion Date/Time: 09/27/2017 3:58  PM Performed by: Blane Ohara, MD Authorized by: Blane Ohara, MD   Consent:    Consent obtained:  Verbal and written   Consent given by:  Patient   Risks discussed:  Induced arrhythmia and death   Alternatives discussed:  No treatment and rate-control medication Pre-procedure details:    Cardioversion basis:  Emergent   Rhythm:  Atrial fibrillation   Electrode placement:  Anterior-posterior Patient sedated: Yes. Refer to sedation procedure documentation for details of sedation.  Attempt one:    Cardioversion mode:  Synchronous   Waveform:  Biphasic   Shock (Joules):  150   Shock outcome:  Conversion to normal sinus rhythm Post-procedure details:    Patient status:  Alert   Patient tolerance of procedure:  Tolerated well, no immediate complications Comments:     Used fentanyl and etomidate   .Sedation Date/Time: 09/27/2017 3:59 PM Performed by: Blane Ohara, MD Authorized by: Blane Ohara, MD   Consent:    Consent obtained:  Verbal and written   Consent given by:  Patient   Risks discussed:  Allergic reaction, dysrhythmia, inadequate sedation and nausea Universal protocol:    Procedure explained and questions answered to patient or proxy's satisfaction: yes     Relevant documents present and verified: yes     Test results available and properly labeled: yes     Imaging studies available: no     Required blood products, implants,  devices, and special equipment available: no     Site/side marked: no     Immediately prior to procedure a time out was called: yes   Indications:    Procedure performed:  Cardioversion   Procedure necessitating sedation performed by:  Physician performing sedation   Intended level of sedation:  Moderate (conscious sedation) Pre-sedation assessment:    Time since last food or drink:  4 hrs   ASA classification: class 2 - patient with mild systemic disease     Neck mobility: normal     Mouth opening:  3 or more finger widths   Thyromental distance:  4 finger widths   Mallampati score:  I - soft palate, uvula, fauces, pillars visible   Pre-sedation assessments completed and reviewed: airway patency not reviewed, cardiovascular function not reviewed, hydration status not reviewed, mental status not reviewed, nausea/vomiting not reviewed, pain level not reviewed, respiratory function not reviewed and temperature not reviewed     Pre-sedation assessment completed:  09/27/2017 4:01 PM Immediate pre-procedure details:    Reassessment: Patient reassessed immediately prior to procedure     Reviewed: vital signs, relevant labs/tests and NPO status     Verified: bag valve mask available, emergency equipment available, intubation equipment available, IV patency confirmed, oxygen available and reversal medications available   Procedure details (see MAR for exact dosages):    Preoxygenation:  Nasal cannula   Sedation:  Etomidate   Analgesia:  Fentanyl   Intra-procedure monitoring:  Blood pressure monitoring, cardiac monitor, continuous capnometry, continuous pulse oximetry, frequent LOC assessments and frequent vital sign checks   Intra-procedure events: hypercapnia     Intra-procedure management:  Airway repositioning   Total Provider sedation time (minutes):  15 Post-procedure details:    Post-sedation assessment completed:  09/27/2017 4:03 PM   Attendance: Constant attendance by certified staff  until patient recovered     Recovery: Patient returned to pre-procedure baseline     Post-sedation assessments completed and reviewed: airway patency not reviewed, cardiovascular function not reviewed, hydration status not reviewed, mental status not reviewed, nausea/vomiting not  reviewed, pain level not reviewed, respiratory function not reviewed and temperature not reviewed     Patient is stable for discharge or admission: yes     Patient tolerance:  Tolerated well, no immediate complications   (including critical care time)  Medications Ordered in ED Medications  0.9 %  sodium chloride infusion (125 mL/hr Intravenous New Bag/Given 09/27/17 1224)  fentaNYL (SUBLIMAZE) injection 25 mcg (25 mcg Intravenous Given 09/27/17 1405)  ondansetron (ZOFRAN) 4 MG/2ML injection (not administered)  etomidate (AMIDATE) injection 15 mg (15 mg Intravenous Given 09/27/17 1407)  ondansetron (ZOFRAN) injection 4 mg (4 mg Intravenous Given 09/27/17 1435)     Initial Impression / Assessment and Plan / ED Course  I have reviewed the triage vital signs and the nursing notes.  Pertinent labs & imaging results that were available during my care of the patient were reviewed by me and considered in my medical decision making (see chart for details).    Patient with atrial fibrillation presents in atrial fibrillation with rate controlled. Patient has been frustrated as she has been in for almost 3 days. Patient is on anticoagulant. Discussed with patient and with her cardiologist and they both agreed to cardiovert. Patient cardioverted on one attempt without difficulty. Discussed outpatient follow-up with cardiology next week.  CHA2DS2/VAS Stroke Risk Points      3 >= 2 Points: High Risk  1 - 1.99 Points: Medium Risk  0 Points: Low Risk    The previous score was 2 on 10/19/2016.:  Change:     Details    This score determines the patient's risk of having a stroke if the  patient has atrial fibrillation.        Points Metrics  0 Has Congestive Heart Failure:  No   0 Has Vascular Disease:  No   1 Has Hypertension:  Yes   1 Age:  [age]   0 Has Diabetes:  No   0 Had Stroke:  No  Had TIA:  No  Had thromboembolism:  No   1 Female:  Yes      Pt on xarelto.  Results and differential diagnosis were discussed with the patient/parent/guardian. Xrays were independently reviewed by myself.  Close follow up outpatient was discussed, comfortable with the plan.   Medications  0.9 %  sodium chloride infusion (125 mL/hr Intravenous New Bag/Given 09/27/17 1224)  fentaNYL (SUBLIMAZE) injection 25 mcg (25 mcg Intravenous Given 09/27/17 1405)  ondansetron (ZOFRAN) 4 MG/2ML injection (not administered)  etomidate (AMIDATE) injection 15 mg (15 mg Intravenous Given 09/27/17 1407)  ondansetron (ZOFRAN) injection 4 mg (4 mg Intravenous Given 09/27/17 1435)    Vitals:   09/27/17 1445 09/27/17 1500 09/27/17 1515 09/27/17 1530  BP: (!) 153/65 (!) 150/58 (!) 144/71 (!) 148/60  Pulse: 66 75 69 73  Resp: 16 17 17 19   Temp:      TempSrc:      SpO2: 98% 98% 98% 98%  Weight:      Height:        Final diagnoses:  AF (paroxysmal atrial fibrillation) (HCC)         Final Clinical Impressions(s) / ED Diagnoses   Final diagnoses:  AF (paroxysmal atrial fibrillation) [facility](HCC)    ED Discharge Orders    None       Blane OharaZavitz, Afnan Cadiente, MD 09/27/17 1605

## 2017-09-27 NOTE — Telephone Encounter (Signed)
Patient notified of need to be seen in ER and voiced understanding.

## 2017-09-27 NOTE — ED Triage Notes (Signed)
States she has been in A FIB since Wednesday.  State she has cardizem to take at home,  But she is staying in A FIB.  Denies any other complaints.

## 2017-09-27 NOTE — Progress Notes (Signed)
Oral airway inserted per Dr. Jodi MourningZavitz due to patient tongue back and patient snoring. Tolerating [place] at 4.5L and oral airway at this time.

## 2017-09-27 NOTE — Telephone Encounter (Signed)
Pt notified. Pt states that her afib has become uncomfortable and would like to know if she should be seen in the ED for this. Pt denies increased SOB. Please Advise.

## 2017-09-27 NOTE — ED Notes (Signed)
Husband at bedside, pt talking, denies pain

## 2017-10-02 ENCOUNTER — Other Ambulatory Visit: Payer: Self-pay

## 2017-10-02 MED ORDER — SPIRONOLACTONE 25 MG PO TABS: ORAL_TABLET | ORAL | 6 refills | Status: DC

## 2017-10-08 ENCOUNTER — Telehealth: Payer: Self-pay | Admitting: Cardiology

## 2017-10-08 NOTE — Telephone Encounter (Signed)
Patient states that she is in A-fib started last night. Please call with instructions regarding medications. / tg

## 2017-10-08 NOTE — Telephone Encounter (Signed)
Patient had cardioversion 11 days ago for a-fib.last night at 8 pm went back into a-fib.Heart rate now is 60-70's, was 110 last night Took Cardizem  90 mg last night and remains in a-fib today. Patient anxious, will forward to Dr Ladona Ridgelaylor

## 2017-10-09 NOTE — Telephone Encounter (Signed)
Patient has called again , wants to hear from Dr.Taylor, wants apt in [place] sooner than march apt in [place]

## 2017-10-10 ENCOUNTER — Telehealth: Payer: Self-pay | Admitting: Internal Medicine

## 2017-10-10 NOTE — Telephone Encounter (Signed)
Returned call to Pt.  Pt discussed recent issues with afib.  Notified Pt this nurse had spoken with Dr. Ladona Ridgelaylor.  Per Dr. Ladona Ridgelaylor Pt may take an extra diltiazem up to every 2 hours as needed for heart racing.  Pt indicates understanding.  Confirmed appt on Feb 5.  Provided reassurance.  Pt will call if any further issues.

## 2017-10-10 NOTE — Telephone Encounter (Signed)
Scheduler to get earlier appt.  No further action at this time.

## 2017-10-10 NOTE — Telephone Encounter (Signed)
Patient calling  Patient is in AFIB and scheduled to see Dr. Ladona Ridgelaylor 10-22-17. Patient went to [facility] 09-27-17 for AFIB and had a cardioversion but she has been experiencing AFIB episodes since then.

## 2017-10-11 ENCOUNTER — Other Ambulatory Visit: Payer: Self-pay | Admitting: Cardiology

## 2017-10-14 ENCOUNTER — Telehealth: Payer: Self-pay | Admitting: Cardiology

## 2017-10-14 NOTE — Telephone Encounter (Signed)
Patient states in past she was told she could stop and only take if PAF returns.

## 2017-10-14 NOTE — Telephone Encounter (Signed)
Patient states that she is no longer in A-Fib. Wants to know if she needs to continue taking Diltiazem. / tg

## 2017-10-15 NOTE — Telephone Encounter (Signed)
Patient will take cardizem only if needed

## 2017-10-15 NOTE — Telephone Encounter (Signed)
She has short acting Cardizem to be used as needed.

## 2017-10-22 ENCOUNTER — Encounter (INDEPENDENT_AMBULATORY_CARE_PROVIDER_SITE_OTHER): Payer: Self-pay

## 2017-10-22 ENCOUNTER — Encounter: Payer: Self-pay | Admitting: Internal Medicine

## 2017-10-22 ENCOUNTER — Ambulatory Visit: Payer: Medicare Other | Admitting: Internal Medicine

## 2017-10-22 VITALS — BP 122/70 | HR 54 | Ht 66.0 in | Wt 176.0 lb

## 2017-10-22 DIAGNOSIS — Z79899 Other long term (current) drug therapy: Secondary | ICD-10-CM | POA: Diagnosis not present

## 2017-10-22 DIAGNOSIS — I480 Paroxysmal atrial fibrillation: Secondary | ICD-10-CM | POA: Diagnosis not present

## 2017-10-22 NOTE — Progress Notes (Signed)
HPI [name] returns today for ongoing evaluation and management of atrial fib. She has been on dofetilide in the past. I saw her in November and she had been fairly well controlled. She uses short acting diltiazem for her break through symptoms. She was in the ED a couple of weeks ago. She was cardioverted. She has gone out of rhythm for 5 days but has taken her calcium blocker. Her symptoms are better but she is still very anxious and feels badly when she has rapid atrial fib.  Allergies  Allergen Reactions  . Metoprolol Shortness Of Breath and Other (See Comments)    Tiredness  . Erythromycin Nausea Only     Current Outpatient Medications  Medication Sig Dispense Refill  . acetaminophen (TYLENOL) 500 MG tablet Take 500 mg by mouth every 6 (six) hours as needed for moderate pain.     Marland Kitchen. albuterol (PROVENTIL HFA;VENTOLIN HFA) 108 (90 BASE) MCG/ACT inhaler Inhale 1 puff into the lungs every 6 (six) hours as needed for wheezing or shortness of breath.    . ALPRAZolam (XANAX) 0.5 MG tablet Take 0.5-0.75 mg by mouth 3 (three) times daily as needed for anxiety.     . Artificial Tear Solution (SOOTHE XP) SOLN Place 1-2 drops into both eyes daily as needed (dry eyes).     Marland Kitchen. atorvastatin (LIPITOR) 20 MG tablet Take 20 mg by mouth daily.    Marland Kitchen. BYSTOLIC 20 MG TABS TAKE 1 TABLET BY MOUTH EVERY DAY 30 tablet 6  . diltiazem (CARDIZEM) 30 MG tablet Take 1 tablet (30 mg total) by mouth 3 (three) times daily. PRN for AFIB 90 tablet 2  . dofetilide (TIKOSYN) 250 MCG capsule TAKE ONE CAPSULE BY MOUTH EVERY 12 HOURS 60 capsule 10  . EDARBI 80 MG TABS TAKE 1 TABLET BY MOUTH EVERY DAY 30 tablet 6  . hydrALAZINE (APRESOLINE) 100 MG tablet TAKE 1 TABLET BY MOUTH 3 TIMES A DAY 90 tablet 3  . rivaroxaban (XARELTO) 20 MG TABS tablet Take 20 mg by mouth daily with supper.    Marland Kitchen. spironolactone (ALDACTONE) 25 MG tablet TAKE 1/2 TABLET (12.5mg ) BY MOUTH EVERY DAY 45 tablet 6  . TRELEGY ELLIPTA 100-62.5-25  MCG/INH AEPB INHALE 1 PUFF BY MOUTH EVERY DAY  0   No current facility-administered medications for this visit.      Past Medical History:  Diagnosis Date  . Anxiety   . COPD (chronic obstructive pulmonary disease) (HCC)   . Essential hypertension   . Herpes zoster   . Hyperlipidemia   . PAF (paroxysmal atrial fibrillation) (HCC)    [place], also [facility] EP assessment by Dr. Christin Dudley  . Vitamin D deficiency     ROS:   All systems reviewed and negative except as noted in the HPI.   Past Surgical History:  Procedure Laterality Date  . ABDOMINAL HYSTERECTOMY    . CATARACT EXTRACTION       Family History  Problem Relation Age of Onset  . Breast cancer Sister   . CAD Father   . Asthma Mother      Social History   Socioeconomic History  . Marital status: Married    Spouse name: Not on file  . Number of children: Not on file  . Years of education: Not on file  . Highest education level: Not on file  Social Needs  . Financial resource strain: Not on file  . Food insecurity - worry: Not on  file  . Food insecurity - inability: Not on file  . Transportation needs - medical: Not on file  . Transportation needs - non-medical: Not on file  Occupational History  . Not on file  Tobacco Use  . Smoking status: Former Smoker    Packs/day: 0.50    Years: 30.00    Pack years: 15.00    Types: Cigarettes    Start date: 11/03/1977    Last attempt to quit: 06/01/2014    Years since quitting: 3.3  . Smokeless tobacco: Never Used  Substance and Sexual Activity  . Alcohol use: No    Alcohol/week: 0.0 oz  . Drug use: No  . Sexual activity: Yes  Other Topics Concern  . Not on file  Social History Narrative  . Not on file     BP 122/70   Pulse (!) 54   Ht 5\' 6"  (1.676 m)   Wt 176 lb (79.8 kg)   BMI 28.41 kg/m   Physical Exam:  stable appearing 72 yo woman, wearing oxygen, NAD HEENT: Unremarkable Neck:  7 cm JVD, no thyromegally Lymphatics:   No adenopathy Back:  No CVA tenderness Lungs:  Clear with no wheezes HEART:  Regular rate rhythm, no murmurs, no rubs, no clicks Abd:  soft, positive bowel sounds, no organomegally, no rebound, no guarding Ext:  2 plus pulses, no edema, no cyanosis, no clubbing Skin:  No rashes no nodules Neuro:  CN II through XII intact, motor grossly intact  EKG - NSR   Assess/Plan: 1. PAF - the patient remains quite debilitated by her atrial fib. I have discussed the treatment options. She is not a candidate for amiodarone due to her lung disease on oxygen dependence. I would anticipate she continue her dofetilide. 2. COPD - she is using oxygen and has not had a recent COPD flare. She will continue her bronchodilators 3. HTN - her pressures have been reasonably well controlled, especially when she is not too anxious.  Leonia Reeves.D.

## 2017-10-22 NOTE — Patient Instructions (Addendum)
Medication Instructions:  Your physician recommends that you continue on your current medications as directed. Please refer to the Current Medication list given to you today.  Labwork: None ordered.  Testing/Procedures:  Your physician has recommended that you have an ablation. Catheter ablation is a medical procedure used to treat some cardiac arrhythmias (irregular heartbeats). During catheter ablation, a long, thin, flexible tube is put into a blood vessel in your groin (upper thigh), or neck. This tube is called an ablation catheter. It is then guided to your heart through the blood vessel. Radio frequency waves destroy small areas of heart tissue where abnormal heartbeats may cause an arrhythmia to start. Please see the instruction sheet given to you today.   Your physician has recommended that you have a pacemaker inserted. A pacemaker is a small device that is placed under the skin of your chest or abdomen to help control abnormal heart rhythms. This device uses electrical pulses to prompt the heart to beat at a normal rate. Pacemakers are used to treat heart rhythms that are too slow. Wire (leads) are attached to the pacemaker that goes into the chambers of you heart. This is done in the hospital and usually requires and overnight stay. Please see the instruction sheet given to you today for more information.  Follow-Up:  The following dates are available for this procedure: February 8, 11, 14, 19, 20, 25, 26 March 4, 8, 18, 22, 25, 28 Give me a call when you decide on a date.  Boneta LucksJenny RN [phone]  Any Other Special Instructions Will Be Listed Below (If Applicable).  If you need a refill on your cardiac medications before your next appointment, please call your pharmacy.   Cardiac Ablation Cardiac ablation is a procedure to disable (ablate) a small amount of heart tissue in very specific places. The heart has many electrical connections. Sometimes these connections are abnormal  and can cause the heart to beat very fast or irregularly. Ablating some of the problem areas can improve the heart rhythm or return it to normal. Ablation may be done for people who:  Have Wolff-Parkinson-White syndrome.  Have fast heart rhythms (tachycardia).  Have taken medicines for an abnormal heart rhythm (arrhythmia) that were not effective or caused side effects.  Have a high-risk heartbeat that may be life-threatening.  During the procedure, a small incision is made in the neck or the groin, and a long, thin, flexible tube (catheter) is inserted into the incision and moved to the heart. Small devices (electrodes) on the tip of the catheter will send out electrical currents. A type of X-ray (fluoroscopy) will be used to help guide the catheter and to provide images of the heart. Tell a health care provider about:  Any allergies you have.  All medicines you are taking, including vitamins, herbs, eye drops, creams, and over-the-counter medicines.  Any problems you or family members have had with anesthetic medicines.  Any blood disorders you have.  Any surgeries you have had.  Any medical conditions you have, such as kidney failure.  Whether you are pregnant or may be pregnant. What are the risks? Generally, this is a safe procedure. However, problems may occur, including:  Infection.  Bruising and bleeding at the catheter insertion site.  Bleeding into the chest, especially into the sac that surrounds the heart. This is a serious complication.  Stroke or blood clots.  Damage to other structures or organs.  Allergic reaction to medicines or dyes.  Need for a permanent pacemaker  if the normal electrical system is damaged. A pacemaker is a small computer that sends electrical signals to the heart and helps your heart beat normally.  The procedure not being fully effective. This may not be recognized until months later. Repeat ablation procedures are sometimes  required.  What happens before the procedure?  Follow instructions from your health care provider about eating or drinking restrictions.  Ask your health care provider about: ? Changing or stopping your regular medicines. This is especially important if you are taking diabetes medicines or blood thinners. ? Taking medicines such as aspirin and ibuprofen. These medicines can thin your blood. Do not take these medicines before your procedure if your health care provider instructs you not to.  Plan to have someone take you home from the hospital or clinic.  If you will be going home right after the procedure, plan to have someone with you for 24 hours. What happens during the procedure?  To lower your risk of infection: ? Your health care team will wash or sanitize their hands. ? Your skin will be washed with soap. ? Hair may be removed from the incision area.  An IV tube will be inserted into one of your veins.  You will be given a medicine to help you relax (sedative).  The skin on your neck or groin will be numbed.  An incision will be made in your neck or your groin.  A needle will be inserted through the incision and into a large vein in your neck or groin.  A catheter will be inserted into the needle and moved to your heart.  Dye may be injected through the catheter to help your surgeon see the area of the heart that needs treatment.  Electrical currents will be sent from the catheter to ablate heart tissue in desired areas. There are three types of energy that may be used to ablate heart tissue: ? Heat (radiofrequency energy). ? Laser energy. ? Extreme cold (cryoablation).  When the necessary tissue has been ablated, the catheter will be removed.  Pressure will be held on the catheter insertion area to prevent excessive bleeding.  A bandage (dressing) will be placed over the catheter insertion area. The procedure may vary among health care providers and  hospitals. What happens after the procedure?  Your blood pressure, heart rate, breathing rate, and blood oxygen level will be monitored until the medicines you were given have worn off.  Your catheter insertion area will be monitored for bleeding. You will need to lie still for a few hours to ensure that you do not bleed from the catheter insertion area.  Do not drive for 24 hours or as long as directed by your health care provider. Summary  Cardiac ablation is a procedure to disable (ablate) a small amount of heart tissue in very specific places. Ablating some of the problem areas can improve the heart rhythm or return it to normal.  During the procedure, electrical currents will be sent from the catheter to ablate heart tissue in desired areas. This information is not intended to replace advice given to you by your health care provider. Make sure you discuss any questions you have with your health care provider. Document Released: 01/20/2009 Document Revised: 07/23/2016 Document Reviewed: 07/23/2016 Elsevier Interactive Patient Education  2018 [employer].    Pacemaker Implantation, Adult Pacemaker implantation is a procedure to place a pacemaker inside your chest. A pacemaker is a small computer that sends electrical signals to the heart  and helps your heart beat normally. A pacemaker also stores information about your heart rhythms. You may need pacemaker implantation if you:  Have a slow heartbeat (bradycardia).  Faint (syncope).  Have shortness of breath (dyspnea) due to heart problems.  The pacemaker attaches to your heart through a wire, called a lead. Sometimes just one lead is needed. Other times, there will be two leads. There are two types of pacemakers:  Transvenous pacemaker. This type is placed under the skin or muscle of your chest. The lead goes through a vein in the chest area to reach the inside of the heart.  Epicardial pacemaker. This type is placed under the  skin or muscle of your chest or belly. The lead goes through your chest to the outside of the heart.  Tell a health care provider about:  Any allergies you have.  All medicines you are taking, including vitamins, herbs, eye drops, creams, and over-the-counter medicines.  Any problems you or family members have had with anesthetic medicines.  Any blood or bone disorders you have.  Any surgeries you have had.  Any medical conditions you have.  Whether you are pregnant or may be pregnant. What are the risks? Generally, this is a safe procedure. However, problems may occur, including:  Infection.  Bleeding.  Failure of the pacemaker or the lead.  Collapse of a lung or bleeding into a lung.  Blood clot inside a blood vessel with a lead.  Damage to the heart.  Infection inside the heart (endocarditis).  Allergic reactions to medicines.  What happens before the procedure? Staying hydrated Follow instructions from your health care provider about hydration, which may include:  Up to 2 hours before the procedure - you may continue to drink clear liquids, such as water, clear fruit juice, black coffee, and plain tea.  Eating and drinking restrictions Follow instructions from your health care provider about eating and drinking, which may include:  8 hours before the procedure - stop eating heavy meals or foods such as meat, fried foods, or fatty foods.  6 hours before the procedure - stop eating light meals or foods, such as toast or cereal.  6 hours before the procedure - stop drinking milk or drinks that contain milk.  2 hours before the procedure - stop drinking clear liquids.  Medicines  Ask your health care provider about: ? Changing or stopping your regular medicines. This is especially important if you are taking diabetes medicines or blood thinners. ? Taking medicines such as aspirin and ibuprofen. These medicines can thin your blood. Do not take these medicines  before your procedure if your health care provider instructs you not to.  You may be given antibiotic medicine to help prevent infection. General instructions  You will have a heart evaluation. This may include an electrocardiogram (ECG), chest X-ray, and heart imaging (echocardiogram,  or echo) tests.  You will have blood tests.  Do not use any products that contain nicotine or tobacco, such as cigarettes and e-cigarettes. If you need help quitting, ask your health care provider.  Plan to have someone take you home from the hospital or clinic.  If you will be going home right after the procedure, plan to have someone with you for 24 hours.  Ask your health care provider how your surgical site will be marked or identified. What happens during the procedure?  To reduce your risk of infection: ? Your health care team will wash or sanitize their hands. ? Your skin  will be washed with soap. ? Hair may be removed from the surgical area.  An IV tube will be inserted into one of your veins.  You will be given one or more of the following: ? A medicine to help you relax (sedative). ? A medicine to numb the area (local anesthetic). ? A medicine to make you fall asleep (general anesthetic).  If you are getting a transvenous pacemaker: ? An incision will be made in your upper chest. ? A pocket will be made for the pacemaker. It may be placed under the skin or between layers of muscle. ? The lead will be inserted into a blood vessel that returns to the heart. ? While X-rays are taken by an imaging machine (fluoroscopy), the lead will be advanced through the vein to the inside of your heart. ? The other end of the lead will be tunneled under the skin and attached to the pacemaker.  If you are getting an epicardial pacemaker: ? An incision will be made near your ribs or breastbone (sternum) for the lead. ? The lead will be attached to the outside of your heart. ? Another incision will be  made in your chest or upper belly to create a pocket for the pacemaker. ? The free end of the lead will be tunneled under the skin and attached to the pacemaker.  The transvenous or epicardial pacemaker will be tested. Imaging studies may be done to check the lead position.  The incisions will be closed with stitches (sutures), adhesive strips, or skin glue.  Bandages (dressing) will be placed over the incisions. The procedure may vary among health care providers and hospitals. What happens after the procedure?  Your blood pressure, heart rate, breathing rate, and blood oxygen level will be monitored until the medicines you were given have worn off.  You will be given antibiotics and pain medicine.  ECG and chest x-rays will be done.  You will wear a continuous type of ECG (Holter monitor) to check your heart rhythm.  Your health care provider willprogram the pacemaker.  Do not drive for 24 hours if you received a sedative. This information is not intended to replace advice given to you by your health care provider. Make sure you discuss any questions you have with your health care provider. Document Released: 08/24/2002 Document Revised: 03/23/2016 Document Reviewed: 02/15/2016 Elsevier Interactive Patient Education  [employer].

## 2017-10-24 ENCOUNTER — Telehealth: Payer: Self-pay | Admitting: Internal Medicine

## 2017-10-24 NOTE — Telephone Encounter (Signed)
Patient calling in regarding pacemaker implantation.

## 2017-10-24 NOTE — Telephone Encounter (Signed)
Call received from Pt.  Pt wants to meet with Dr. 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 Pt has upcoming appt with Dr. Ladona Ridgelaylor in [place].  No further action needed at this time.

## 2017-11-07 ENCOUNTER — Other Ambulatory Visit: Payer: Self-pay | Admitting: Cardiology

## 2017-11-28 ENCOUNTER — Emergency Department (HOSPITAL_COMMUNITY): Payer: Medicare Other

## 2017-11-28 ENCOUNTER — Emergency Department (HOSPITAL_COMMUNITY)
Admission: EM | Admit: 2017-11-28 | Discharge: 2017-11-29 | Disposition: A | Discharge status: Home / Self Care | Payer: Medicare Other | Attending: Emergency Medicine | Admitting: Emergency Medicine

## 2017-11-28 ENCOUNTER — Encounter (HOSPITAL_COMMUNITY): Payer: Self-pay | Admitting: Emergency Medicine

## 2017-11-28 ENCOUNTER — Other Ambulatory Visit: Payer: Self-pay

## 2017-11-28 DIAGNOSIS — F458 Other somatoform disorders: Secondary | ICD-10-CM | POA: Insufficient documentation

## 2017-11-28 DIAGNOSIS — R0989 Other specified symptoms and signs involving the circulatory and respiratory systems: Secondary | ICD-10-CM

## 2017-11-28 DIAGNOSIS — M6281 Muscle weakness (generalized): Secondary | ICD-10-CM | POA: Diagnosis not present

## 2017-11-28 DIAGNOSIS — R197 Diarrhea, unspecified: Secondary | ICD-10-CM

## 2017-11-28 DIAGNOSIS — R112 Nausea with vomiting, unspecified: Secondary | ICD-10-CM | POA: Insufficient documentation

## 2017-11-28 DIAGNOSIS — Z87891 Personal history of nicotine dependence: Secondary | ICD-10-CM | POA: Insufficient documentation

## 2017-11-28 DIAGNOSIS — J069 Acute upper respiratory infection, unspecified: Secondary | ICD-10-CM | POA: Diagnosis not present

## 2017-11-28 DIAGNOSIS — I10 Essential (primary) hypertension: Secondary | ICD-10-CM | POA: Diagnosis not present

## 2017-11-28 DIAGNOSIS — H9201 Otalgia, right ear: Secondary | ICD-10-CM | POA: Diagnosis present

## 2017-11-28 DIAGNOSIS — J449 Chronic obstructive pulmonary disease, unspecified: Secondary | ICD-10-CM | POA: Insufficient documentation

## 2017-11-28 DIAGNOSIS — Z79899 Other long term (current) drug therapy: Secondary | ICD-10-CM | POA: Insufficient documentation

## 2017-11-28 DIAGNOSIS — R531 Weakness: Secondary | ICD-10-CM

## 2017-11-28 DIAGNOSIS — F419 Anxiety disorder, unspecified: Secondary | ICD-10-CM | POA: Diagnosis not present

## 2017-11-28 HISTORY — DX: Unspecified atrial fibrillation: I48.91

## 2017-11-28 LAB — URINALYSIS, ROUTINE W REFLEX MICROSCOPIC
BACTERIA UA: NONE SEEN
BILIRUBIN URINE: NEGATIVE
Glucose, UA: NEGATIVE mg/dL
Hgb urine dipstick: NEGATIVE
Ketones, ur: NEGATIVE mg/dL
NITRITE: NEGATIVE
Protein, ur: NEGATIVE mg/dL
SPECIFIC GRAVITY, URINE: 1.012 (ref 1.005–1.030)
pH: 7 (ref 5.0–8.0)

## 2017-11-28 LAB — CBC
HEMATOCRIT: 35.4 % — AB (ref 36.0–46.0)
HEMOGLOBIN: 11.2 g/dL — AB (ref 12.0–15.0)
MCH: 30.3 pg (ref 26.0–34.0)
MCHC: 31.6 g/dL (ref 30.0–36.0)
MCV: 95.7 fL (ref 78.0–100.0)
Platelets: 164 10*3/uL (ref 150–400)
RBC: 3.7 MIL/uL — ABNORMAL LOW (ref 3.87–5.11)
RDW: 12.8 % (ref 11.5–15.5)
WBC: 6.3 10*3/uL (ref 4.0–10.5)

## 2017-11-28 LAB — DIFFERENTIAL
BASOS PCT: 0 %
Basophils Absolute: 0 10*3/uL (ref 0.0–0.1)
EOS PCT: 1 %
Eosinophils Absolute: 0 10*3/uL (ref 0.0–0.7)
Lymphocytes Relative: 13 %
Lymphs Abs: 0.8 10*3/uL (ref 0.7–4.0)
MONO ABS: 0.5 10*3/uL (ref 0.1–1.0)
Monocytes Relative: 9 %
NEUTROS ABS: 4.9 10*3/uL (ref 1.7–7.7)
NEUTROS PCT: 77 %

## 2017-11-28 LAB — COMPREHENSIVE METABOLIC PANEL
ALBUMIN: 3.8 g/dL (ref 3.5–5.0)
ALT: 13 U/L — ABNORMAL LOW (ref 14–54)
ANION GAP: 12 (ref 5–15)
AST: 18 U/L (ref 15–41)
Alkaline Phosphatase: 120 U/L (ref 38–126)
BUN: 15 mg/dL (ref 6–20)
CHLORIDE: 88 mmol/L — AB (ref 101–111)
CO2: 31 mmol/L (ref 22–32)
Calcium: 9.4 mg/dL (ref 8.9–10.3)
Creatinine, Ser: 1.21 mg/dL — ABNORMAL HIGH (ref 0.44–1.00)
GFR calc Af Amer: 52 mL/min — ABNORMAL LOW (ref >60)
GFR calc non Af Amer: 45 mL/min — ABNORMAL LOW (ref >60)
GLUCOSE: 86 mg/dL (ref 65–99)
POTASSIUM: 4.3 mmol/L (ref 3.5–5.1)
SODIUM: 131 mmol/L — AB (ref 135–145)
TOTAL PROTEIN: 7 g/dL (ref 6.5–8.1)
Total Bilirubin: 1.3 mg/dL — ABNORMAL HIGH (ref 0.3–1.2)

## 2017-11-28 LAB — INFLUENZA PANEL BY PCR (TYPE A & B)
INFLAPCR: NEGATIVE
INFLBPCR: NEGATIVE

## 2017-11-28 LAB — LACTIC ACID, PLASMA
LACTIC ACID, VENOUS: 0.9 mmol/L (ref 0.5–1.9)
LACTIC ACID, VENOUS: 0.8 mmol/L (ref 0.5–1.9)

## 2017-11-28 LAB — LIPASE, BLOOD: LIPASE: 27 U/L (ref 11–51)

## 2017-11-28 LAB — TROPONIN I

## 2017-11-28 LAB — RAPID STREP SCREEN (MED CTR MEBANE ONLY): Streptococcus, Group A Screen (Direct): NEGATIVE

## 2017-11-28 MED ORDER — SODIUM CHLORIDE 0.9 % IV BOLUS (SEPSIS)
500.0000 mL | Freq: Once | INTRAVENOUS | Status: AC
  Administered 2017-11-28: 500 mL via INTRAVENOUS

## 2017-11-28 MED ORDER — ONDANSETRON HCL 4 MG/2ML IJ SOLN
4.0000 mg | INTRAMUSCULAR | Status: DC | PRN
  Administered 2017-11-28: 4 mg via INTRAVENOUS
  Filled 2017-11-28: qty 2

## 2017-11-28 MED ORDER — FAMOTIDINE IN NACL 20-0.9 MG/50ML-% IV SOLN
20.0000 mg | Freq: Once | INTRAVENOUS | Status: AC
  Administered 2017-11-28: 20 mg via INTRAVENOUS
  Filled 2017-11-28: qty 50

## 2017-11-28 MED ORDER — SODIUM CHLORIDE 0.9 % IV SOLN
INTRAVENOUS | Status: DC
  Administered 2017-11-28: 22:00:00 via INTRAVENOUS

## 2017-11-28 MED ORDER — ONDANSETRON 4 MG PO TBDP: 4.0000 mg | ORAL_TABLET | Freq: Three times a day (TID) | ORAL | 0 refills | Status: DC | PRN

## 2017-11-28 NOTE — ED Provider Notes (Signed)
[facility] EMERGENCY DEPARTMENT Provider Note   CSN: [id number] Arrival date & time: 11/28/17  1725     History   Chief Complaint Chief Complaint  Patient presents with  . Emesis    HPI Tracey Dudley is a 72 y.o. female.  HPI  Pt was seen at 2030. Per pt, c/o gradual onset and persistence of constant multiple symptoms for the past 3 days. Symptoms include: "throbbing" right ear pain, ears congestion, sinus congestion, FB sensation in throat when swallowing, multiple intermittent episodes of N/V/D, generalized weakness/fatigue, abd "cramping," decreased PO intake.  Pt states she was evaluated at [facility] 2 days ago and dx "virus." Pt states she continues to have symptoms, so she came to the ED today for further evaluation. Denies CP/SOB, no cough, no back pain, no neck pain, no fevers, no rash, no focal motor weakness.   Past Medical History:  Diagnosis Date  . Anxiety   . Atrial fibrillation (HCC)   . COPD (chronic obstructive pulmonary disease) (HCC)   . Essential hypertension   . Herpes zoster   . Hyperlipidemia   . PAF (paroxysmal atrial fibrillation) (HCC)    Followed by Dr. Leonie Green in [place], also [facility] EP assessment by Dr. Christin Fudge  . Vitamin D deficiency     Patient Active Problem List   Diagnosis Date Noted  . CAP (community acquired pneumonia) 10/08/2015  . A-fib (HCC) 10/08/2015  . HTN (hypertension) 10/08/2015  . COPD with exacerbation (HCC) 10/08/2015    Past Surgical History:  Procedure Laterality Date  . ABDOMINAL HYSTERECTOMY    . CATARACT EXTRACTION      OB History    Gravida Para Term Preterm AB Living             1   SAB TAB Ectopic Multiple Live Births                   Home Medications    Prior to Admission medications   Medication Sig Start Date End Date Taking? Authorizing Provider  acetaminophen (TYLENOL) 500 MG tablet Take 500 mg by mouth every 6 (six) hours as needed for moderate pain.     [provider]  albuterol  (PROVENTIL HFA;VENTOLIN HFA) 108 (90 BASE) MCG/ACT inhaler Inhale 1 puff into the lungs every 6 (six) hours as needed for wheezing or shortness of breath.    [provider]  ALPRAZolam Prudy Feeler) 0.5 MG tablet Take 0.5-0.75 mg by mouth 3 (three) times daily as needed for anxiety.     [provider]  Artificial Tear Solution (SOOTHE XP) SOLN Place 1-2 drops into both eyes daily as needed (dry eyes).     [provider]  atorvastatin (LIPITOR) 20 MG tablet Take 20 mg by mouth daily.    [provider]  BYSTOLIC 20 MG TABS TAKE 1 TABLET BY MOUTH EVERY DAY 06/17/17   Jonelle Sidle, MD  diltiazem (CARDIZEM) 30 MG tablet Take 1 tablet (30 mg total) by mouth 3 (three) times daily. PRN for AFIB 05/06/17   Marinus Maw, MD  dofetilide [facility]) 250 MCG capsule TAKE ONE CAPSULE BY MOUTH EVERY 12 HOURS 11/07/17   Jonelle Sidle, MD  EDARBI 80 MG TABS TAKE 1 TABLET BY MOUTH EVERY DAY 08/28/17   Jonelle Sidle, MD  hydrALAZINE (APRESOLINE) 100 MG tablet TAKE 1 TABLET BY MOUTH 3 TIMES A DAY 10/11/17   Jonelle Sidle, MD  rivaroxaban (XARELTO) 20 MG TABS tablet Take 20 mg by  mouth daily with supper.    [provider]  spironolactone (ALDACTONE) 25 MG tablet TAKE 1/2 TABLET (12.5mg ) BY MOUTH EVERY DAY 10/02/17   Jonelle Sidle, MD  TRELEGY ELLIPTA 100-62.5-25 MCG/INH AEPB INHALE 1 PUFF BY MOUTH EVERY DAY 11/02/16   [provider]    Family History Family History  Problem Relation Age of Onset  . Breast cancer Sister   . CAD Father   . Asthma Mother     Social History Social History   Tobacco Use  . Smoking status: Former Smoker    Packs/day: 0.50    Years: 30.00    Pack years: 15.00    Types: Cigarettes    Start date: 11/03/1977    Last attempt to quit: 06/01/2014    Years since quitting: 3.4  . Smokeless tobacco: Never Used  Substance Use Topics  . Alcohol use: No    Alcohol/week: 0.0 oz  . Drug use: No     Allergies     Metoprolol and Erythromycin   Review of Systems Review of Systems ROS: Statement: All systems negative except as marked or noted in the HPI; Constitutional: Negative for fever and chills. +generalized weakness/fatigue, decreased PO intake..; ; Eyes: Negative for eye pain, redness and discharge. ; ; ENMT: Negative for hoarseness, +right ear pain, nasal congestion, sinus pressure and FB sensation in throat. ; ; Cardiovascular: Negative for chest pain, palpitations, diaphoresis, dyspnea and peripheral edema. ; ; Respiratory: Negative for cough, wheezing and stridor. ; ; Gastrointestinal: +N/V/D, abd cramping. Negative for blood in stool, hematemesis, jaundice and rectal bleeding. ; ; Genitourinary: Negative for dysuria, flank pain and hematuria. ; ; Musculoskeletal: Negative for back pain and neck pain. Negative for swelling and trauma.; ; Skin: Negative for pruritus, rash, abrasions, blisters, bruising and skin lesion.; ; Neuro: Negative for headache, lightheadedness and neck stiffness. Negative for altered level of consciousness, altered mental status, extremity weakness, paresthesias, involuntary movement, seizure and syncope.     Physical Exam Updated Vital Signs BP (!) 186/73 (BP Location: Left Arm)   Pulse (!) 55   Temp 98.2 F (36.8 C) (Oral)   Resp (!) 28   Ht 5\' 6"  (1.676 m)   Wt 81.6 kg (180 lb)   SpO2 94%   BMI 29.05 kg/m    BP 121/76   Pulse (!) 59   Temp 98.2 F (36.8 C) (Oral)   Resp 20   Ht 5\' 6"  (1.676 m)   Wt 81.6 kg (180 lb)   SpO2 100%   BMI 29.05 kg/m    20:43 Orthostatic Vital Signs MW  Orthostatic Lying   BP- Lying: 167/55  Pulse- Lying: 60      Orthostatic Sitting  BP- Sitting: 159/74  Pulse- Sitting: 63      Orthostatic Standing at 0 minutes  BP- Standing at 0 minutes: 143/66  Pulse- Standing at 0 minutes: 65      Orthostatic Standing at 3 minutes  BP- Standing at 3 minutes: 148/63  Pulse- Standing at 3 minutes: 67     Physical  Exam 2035: Physical examination:  Nursing notes reviewed; Vital signs and O2 SAT reviewed;  Constitutional: Well developed, Well nourished, In no acute distress; Head:  Normocephalic, atraumatic. No temporal artery tenderness bilat. No mastoid tenderness bilat.; Eyes: EOMI, PERRL, No scleral icterus; ENMT: Clear fluid levels behind TM's bilat, no erythema or bulging. EAM clear bilat. +edemetous nasal turbinates bilat with clear rhinorrhea. Mouth and pharynx without lesions. No tonsillar exudates. No  intra-oral edema. No submandibular or sublingual edema. No hoarse voice, no drooling, no stridor. No pain with manipulation of larynx. No trismus.  Mouth and pharynx normal, Mucous membranes dry;;  Neck: Supple, Full range of motion, No lymphadenopathy. No neck hematoma, edema, rash. No meningeal signs.; Cardiovascular: Regular rate and rhythm, No gallop; Respiratory: Breath sounds clear & equal bilaterally, No wheezes.  Speaking full sentences with ease, Normal respiratory effort/excursion; Chest: Nontender, Movement normal; Abdomen: Soft, Nontender, Nondistended, Normal bowel sounds; Genitourinary: No CVA tenderness; Extremities: Peripheral pulses normal, No tenderness, No edema, No calf edema or asymmetry.; Neuro: AA&Ox3, Major CN grossly intact.  Speech clear. No gross focal motor or sensory deficits in extremities.; Skin: Color normal, Warm, Dry.; Psych:  Anxious.     ED Treatments / Results  Labs (all labs ordered are listed, but only abnormal results are displayed)   EKG  EKG Interpretation  Date/Time:  Thursday November 28 2017 21:43:53 EDT Ventricular Rate:  64 PR Interval:    QRS Duration: 98 QT Interval:  461 QTC Calculation: 476 R Axis:   96 Text Interpretation:  Sinus rhythm Short PR interval Anteroseptal infarct, age indeterminate When compared with ECG of 09/27/2017 No significant change was found Confirmed by Samuel Jester [phone]) on 11/28/2017 9:49:28 PM        Radiology   Procedures Procedures (including critical care time)  Medications Ordered in ED Medications  sodium chloride 0.9 % bolus 500 mL (not administered)  0.9 %  sodium chloride infusion (not administered)     Initial Impression / Assessment and Plan / ED Course  I have reviewed the triage vital signs and the nursing notes.  Pertinent labs & imaging results that were available during my care of the patient were reviewed by me and considered in my medical decision making (see chart for details).  MDM Reviewed: previous chart, nursing note and vitals Reviewed previous: labs and ECG Interpretation: labs, ECG, x-ray and CT scan   Results for orders placed or performed during the hospital encounter of 11/28/17  Rapid strep screen  Result Value Ref Range   Streptococcus, Group A Screen (Direct) NEGATIVE NEGATIVE  Lipase, blood  Result Value Ref Range   Lipase 27 11 - 51 U/L  Comprehensive metabolic panel  Result Value Ref Range   Sodium 131 (L) 135 - 145 mmol/L   Potassium 4.3 3.5 - 5.1 mmol/L   Chloride 88 (L) 101 - 111 mmol/L   CO2 31 22 - 32 mmol/L   Glucose, Bld 86 65 - 99 mg/dL   BUN 15 6 - 20 mg/dL   Creatinine, Ser 6.04 (H) 0.44 - 1.00 mg/dL   Calcium 9.4 8.9 - 54.0 mg/dL   Total Protein 7.0 6.5 - 8.1 g/dL   Albumin 3.8 3.5 - 5.0 g/dL   AST 18 15 - 41 U/L   ALT 13 (L) 14 - 54 U/L   Alkaline Phosphatase 120 38 - 126 U/L   Total Bilirubin 1.3 (H) 0.3 - 1.2 mg/dL   GFR calc non Af Amer 45 (L) >60 mL/min   GFR calc Af Amer 52 (L) >60 mL/min   Anion gap 12 5 - 15  CBC  Result Value Ref Range   WBC 6.3 4.0 - 10.5 K/uL   RBC 3.70 (L) 3.87 - 5.11 MIL/uL   Hemoglobin 11.2 (L) 12.0 - 15.0 g/dL   HCT 98.1 (L) 19.1 - 47.8 %   MCV 95.7 78.0 - 100.0 fL   MCH 30.3 26.0 - 34.0  pg   MCHC 31.6 30.0 - 36.0 g/dL   RDW 16.1 09.6 - 04.5 %   Platelets 164 150 - 400 K/uL  Urinalysis, Routine w reflex microscopic  Result Value Ref Range   Color, Urine YELLOW YELLOW    APPearance CLEAR CLEAR   Specific Gravity, Urine 1.012 1.005 - 1.030   pH 7.0 5.0 - 8.0   Glucose, UA NEGATIVE NEGATIVE mg/dL   Hgb urine dipstick NEGATIVE NEGATIVE   Bilirubin Urine NEGATIVE NEGATIVE   Ketones, ur NEGATIVE NEGATIVE mg/dL   Protein, ur NEGATIVE NEGATIVE mg/dL   Nitrite NEGATIVE NEGATIVE   Leukocytes, UA TRACE (A) NEGATIVE   RBC / HPF 0-5 0 - 5 RBC/hpf   WBC, UA 0-5 0 - 5 WBC/hpf   Bacteria, UA NONE SEEN NONE SEEN   Squamous Epithelial / LPF 0-5 (A) NONE SEEN  Lactic acid, plasma  Result Value Ref Range   Lactic Acid, Venous 0.9 0.5 - 1.9 mmol/L  Lactic acid, plasma  Result Value Ref Range   Lactic Acid, Venous 0.8 0.5 - 1.9 mmol/L  Differential  Result Value Ref Range   Neutrophils Relative % 77 %   Neutro Abs 4.9 1.7 - 7.7 K/uL   Lymphocytes Relative 13 %   Lymphs Abs 0.8 0.7 - 4.0 K/uL   Monocytes Relative 9 %   Monocytes Absolute 0.5 0.1 - 1.0 K/uL   Eosinophils Relative 1 %   Eosinophils Absolute 0.0 0.0 - 0.7 K/uL   Basophils Relative 0 %   Basophils Absolute 0.0 0.0 - 0.1 K/uL  Influenza panel by PCR (type A & B)  Result Value Ref Range   Influenza A By PCR NEGATIVE NEGATIVE   Influenza B By PCR NEGATIVE NEGATIVE  Troponin I  Result Value Ref Range   Troponin I <0.03 <0.03 ng/mL   Ct Soft Tissue Neck Wo Contrast Result Date: 11/28/2017 CLINICAL DATA:  72 y/o F; nausea, right ear pain, headache since Monday. One episode of vomiting. Sensation of something stuck in the throat. EXAM: CT NECK WITHOUT CONTRAST TECHNIQUE: Multidetector CT imaging of the neck was performed following the standard protocol without intravenous contrast. COMPARISON:  None. FINDINGS: Pharynx and larynx: Normal. No mass or swelling. No foreign body in the airway identified. Salivary glands: No inflammation, mass, or stone. Thyroid: Normal. Lymph nodes: None enlarged or abnormal density. Vascular: Calcific atherosclerosis of carotid siphons, carotid bifurcations, and aortic arch.  Limited intracranial: Negative. Visualized orbits: Negative. Mastoids and visualized paranasal sinuses: Clear. Skeleton: Mild cervical spondylosis with prominent left-sided facet hypertrophy. Upper chest: Moderate centrilobular emphysema of the upper lobes. Other: None. IMPRESSION: 1. No mass or foreign body in the airway identified. No significant inflammatory change. 2. Aortic and carotid system calcific atherosclerosis. 3. Mild cervical spine spondylosis. 4. Moderate centrilobular emphysema of lung apices. Electronically Signed   By: Mitzi Hansen M.D.   On: 11/28/2017 21:45   Dg Abd Acute W/chest Result Date: 11/28/2017 CLINICAL DATA:  Acute onset of nausea.  Difficulty eating. EXAM: DG ABDOMEN ACUTE W/ 1V CHEST COMPARISON:  Chest radiograph performed 04/24/2017 FINDINGS: The lungs are well-aerated and clear. There is no evidence of focal opacification, pleural effusion or pneumothorax. The cardiomediastinal silhouette is borderline enlarged. The visualized bowel gas pattern is unremarkable. Scattered stool and air are seen within the colon; there is no evidence of small bowel dilatation to suggest obstruction. No free intra-abdominal air is identified on the provided upright view. No acute osseous abnormalities are seen; the sacroiliac joints are  unremarkable in appearance. IMPRESSION: 1. Unremarkable bowel gas pattern; no free intra-abdominal air seen. Small amount of stool noted in the colon. 2. Borderline cardiomegaly.  Lungs remain grossly clear. Electronically Signed   By: Roanna RaiderJeffery  Chang M.D.   On: 11/28/2017 21:39     2330:  Labs per baseline. Pt not orthostatic on VS. Extensive workup for pt's multiple symptoms performed and is reassuring. Pt reassured multiple times. Pt has finally agreed to ambulate and trial PO. Pt has ambulated with steady gait, easy resps, NAD. Pt has tol PO well without N/V. Abd remains benign. No stooling while in the ED.  VS remain stable. Pt's husband would  like to take pt home now. No clear indication for admission at this time. Tx symptomatically. Dx and testing d/w pt and family.  Questions answered.  Verb understanding, agreeable to d/c home with outpt f/u.      Final Clinical Impressions(s) / ED Diagnoses   Final diagnoses:  None    ED Discharge Orders    None       Samuel JesterMcManus, Annjanette Wertenberger, DO 12/01/17 1714

## 2017-11-28 NOTE — ED Triage Notes (Addendum)
Pt c/o of nausea, right ear pain and headache since Monday. Vomited x1 today. Pt states "I feel like im choking on something, that something is stuck in my throat"

## 2017-11-28 NOTE — ED Notes (Signed)
Patient transported to X-ray 

## 2017-11-28 NOTE — Discharge Instructions (Signed)
Take the prescription as directed.  Take over the counter tylenol, as directed on packaging, as needed for discomfort. Increase your fluid intake (ie:  Gatoraide) for the next few days.  Eat a bland diet and advance to your regular diet slowly as you can tolerate it.   Avoid full strength juices, as well as milk and milk products until your diarrhea has resolved. Take over the counter decongestant (formulated safe for high blood pressure patients), as directed on packaging, for the next week.  Use over the counter normal saline nasal spray with frequent nose blowing several times per day for the next 2 weeks.  Call your regular medical doctor tomorrow to schedule a follow up appointment this week.  Return to the Emergency Department immediately if worsening.

## 2017-11-28 NOTE — ED Notes (Signed)
Pt ambulated to bathroom without assistance. Pt tolerated well.

## 2017-12-01 LAB — CULTURE, GROUP A STREP (THRC)

## 2017-12-02 LAB — URINE CULTURE

## 2017-12-03 NOTE — Progress Notes (Signed)
ED Antimicrobial Stewardship Positive Culture Follow Up   Tracey Dudley is an 72 y.o. female who presented to [facility] on [facility]2017 with a chief complaint of  Chief Complaint  Patient presents with  . Emesis    Recent Results (from the past 720 hour(s))  Urine culture     Status: Abnormal   Collection Time: 11/28/17  8:45 PM  Result Value Ref Range Status   Specimen Description   Final    URINE, RANDOM Performed at [facility], [address] [place], [place] [address]    Special Requests   Final    NONE Performed at [facility], [address] [place], [place] [address]    Culture (A)  Final    20,000 COLONIES/mL ENTEROCOCCUS FAECALIS 20,000 COLONIES/mL AEROCOCCUS URINAE Standardized susceptibility testing for this organism is not available. Performed at [facility], [address] [place], [place] [address]    Report Status 12/02/2017 FINAL  Final   Organism ID, Bacteria ENTEROCOCCUS FAECALIS (A)  Final      Susceptibility   Enterococcus faecalis - MIC*    AMPICILLIN <=2 SENSITIVE Sensitive     LEVOFLOXACIN 1 SENSITIVE Sensitive     NITROFURANTOIN <=16 SENSITIVE Sensitive     VANCOMYCIN 1 SENSITIVE Sensitive     * 20,000 COLONIES/mL ENTEROCOCCUS FAECALIS  Rapid strep screen     Status: None   Collection Time: 11/28/17  8:45 PM  Result Value Ref Range Status   Streptococcus, Group A Screen (Direct) NEGATIVE NEGATIVE Final    Comment: (NOTE) A Rapid Antigen test may result negative if the antigen level in the sample is below the detection level of this test. The FDA has not cleared this test as a stand-alone test therefore the rapid antigen negative result has reflexed to a Group A Strep culture. Performed at [facility], [address] [place], [place] [address]   Culture, group A strep     Status: None   Collection Time: 11/28/17  8:45 PM  Result Value Ref Range Status   Specimen Description   Final    THROAT Performed at [facility], [address] [place], [place] [address]    Special Requests   Final    NONE Reflexed from [id number] Performed at [facility], [address] [place], [place] [address]    Culture   Final    NO GROUP A STREP (S.PYOGENES) ISOLATED Performed at [facility], [address] [place], [place] [address]    Report Status 12/01/2017 FINAL  Final   UA negative, no urinary symptoms, low colony count. Asymptomatic bacteriuria, no antibiotics required.   Sallee Provencalurner, Alleyne Lac S 12/03/2017, 8:49 AM Infectious Diseases Pharmacist Phone# [phone]

## 2017-12-05 ENCOUNTER — Ambulatory Visit: Payer: Medicare Other | Admitting: Internal Medicine

## 2017-12-10 ENCOUNTER — Other Ambulatory Visit: Payer: Self-pay | Admitting: Internal Medicine

## 2017-12-10 MED ORDER — DILTIAZEM HCL 30 MG PO TABS: 30.0000 mg | ORAL_TABLET | Freq: Three times a day (TID) | ORAL | 3 refills | Status: DC

## 2017-12-23 NOTE — Progress Notes (Signed)
Cardiology Office Note  Date: 12/24/2017   ID: Tracey KindredLinda E Belter, DOB June 19, 1946, MRN [id number]  PCP: Lenoria ChimeWhite, Valerie A, FNP  Primary Cardiologist: Nona DellSamuel Srihitha Tagliaferri, MD   Chief Complaint  Patient presents with  . PAF    History of Present Illness: Tracey KindredLinda E Ingman is a 72 y.o. female last seen in September 2018.  She presents for a follow-up visit.  She states that she has had trouble with a recent viral URI that lasted for several weeks but started to improve within the last week.  Unfortunately, she began to experience palpitations this past Friday consistent with her atrial fibrillation and started on a short acting Cardizem in addition to her standing regimen at that point.  She feels weak and still with a sense of palpitations although heart rate control is better.  Last time this occurred she spontaneously returned to sinus rhythm without cardioversion, but has required a number of cardioversions over time.  Follow-up visit noted with Dr. Ladona Ridgelaylor in February.  She has had breakthrough symptomatic atrial fibrillation, was continued on Tikosyn at that visit.  There has been discussion about possibility of AV node ablation and pacemaker, patient states that she had almost decided to go through with this but still had further questions for Dr. Ladona Ridgelaylor.  I personally reviewed her ECG today which shows coarse atrial fibrillation versus atypical atrial flutter with variable conduction.  I reviewed her medications which are unchanged and outlined below.  Past Medical History:  Diagnosis Date  . Anxiety   . Atrial fibrillation (HCC)   . COPD (chronic obstructive pulmonary disease) (HCC)   . Essential hypertension   . Herpes zoster   . Hyperlipidemia   . PAF (paroxysmal atrial fibrillation) (HCC)    Follow[place] by Dr. Leonie GreenLingle in [place], also [facility] EP assessment by Dr. Christin FudgeHegland  . Vitamin D deficiency     Past Surgical History:  Procedure Laterality Date  . ABDOMINAL HYSTERECTOMY    . CATARACT  EXTRACTION      Current Outpatient Medications  Medication Sig Dispense Refill  . acetaminophen (TYLENOL) 500 MG tablet Take 500 mg by mouth every 6 (six) hours as needed for moderate pain.     Marland Kitchen. albuterol (PROVENTIL HFA;VENTOLIN HFA) 108 (90 BASE) MCG/ACT inhaler Inhale 1 puff into the lungs every 6 (six) hours as needed for wheezing or shortness of breath.    . ALPRAZolam (XANAX) 0.5 MG tablet Take 0.5-0.75 mg by mouth 3 (three) times daily as needed for anxiety.     . Artificial Tear Solution (SOOTHE XP) SOLN Place 1-2 drops into both eyes daily as needed (dry eyes).     Marland Kitchen. atorvastatin (LIPITOR) 20 MG tablet Take 20 mg by mouth daily.    Marland Kitchen. BYSTOLIC 20 MG TABS TAKE 1 TABLET BY MOUTH EVERY DAY 30 tablet 6  . diltiazem (CARDIZEM) 30 MG tablet Take 1 tablet (30 mg total) by mouth 3 (three) times daily. PRN for AFIB 270 tablet 3  . dofetilide (TIKOSYN) 250 MCG capsule TAKE ONE CAPSULE BY MOUTH EVERY 12 HOURS 60 capsule 10  . EDARBI 80 MG TABS TAKE 1 TABLET BY MOUTH EVERY DAY 30 tablet 6  . hydrALAZINE (APRESOLINE) 100 MG tablet TAKE 1 TABLET BY MOUTH 3 TIMES A DAY 90 tablet 3  . ondansetron (ZOFRAN ODT) 4 MG disintegrating tablet Take 1 tablet (4 mg total) by mouth every 8 (eight) hours as needed for nausea or vomiting. 6 tablet 0  . rivaroxaban (XARELTO) 20 MG TABS  tablet Take 20 mg by mouth daily with supper.    Marland Kitchen spironolactone (ALDACTONE) 25 MG tablet TAKE 1/2 TABLET (12.5mg ) BY MOUTH EVERY DAY 45 tablet 6  . TRELEGY ELLIPTA 100-62.5-25 MCG/INH AEPB INHALE 1 PUFF BY MOUTH EVERY DAY  0   No current facility-administered medications for this visit.    Allergies:  Metoprolol and Erythromycin   Social History: The patient  reports that she quit smoking about 3 years ago. Her smoking use included cigarettes. She started smoking about 40 years ago. She has a 15.00 pack-year smoking history. She has never used smokeless tobacco. She reports that she does not drink alcohol or use drugs.   ROS:   Please see the history of present illness. Otherwise, complete review of systems is positive for chronic shortness of breath, on supplemental oxygen.  All other systems are reviewed and negative.   Physical Exam: VS:  BP 128/76 (BP Location: Right Arm)   Pulse 85   Ht 5\' 6"  (1.676 m)   Wt 175 lb (79.4 kg)   SpO2 97%   BMI 28.25 kg/m , BMI Body mass index is 28.25 kg/m.  Wt Readings from Last 3 Encounters:  12/24/17 175 lb (79.4 kg)  11/28/17 180 lb (81.6 kg)  10/22/17 176 lb (79.8 kg)    General: No distress, wearing oxygen via nasal cannula. HEENT: Conjunctiva and lids normal, oropharynx clear. Neck: Supple, no elevated JVP or carotid bruits, no thyromegaly. Lungs: Diminished breath sounds without wheezing, nonlabored breathing at rest. Cardiac: Irregularly irregular, no S3, soft systolic murmur. Abdomen: Soft, nontender, bowel sounds present. Extremities: No pitting edema, distal pulses 2+. Skin: Warm and dry. Musculoskeletal: No kyphosis. Neuropsychiatric: Alert and oriented x3, affect grossly appropriate.  ECG: I personally reviewed the tracing from 11/29/2017 which showed sinus rhythm with early repolarization and QTc 479 ms.  Recent Labwork: 04/02/2017: Magnesium 1.8 11/28/2017: ALT 13; AST 18; BUN 15; Creatinine, Ser 1.21; Hemoglobin 11.2; Platelets 164; Potassium 4.3; Sodium 131   Other Studies Reviewed Today:  Echocardiogram 10/10/2015: Study Conclusions  - Left ventricle: The cavity size was normal. Wall thickness was increased in a pattern of mild LVH. Systolic function was vigorous. The estimated ejection fraction was in the range of 75% to 80%. Wall motion was normal; there were no regional wall motion abnormalities. Features are consistent with a pseudonormal left ventricular filling pattern, with concomitant abnormal relaxation and increased filling pressure (grade 2 diastolic dysfunction). - Aortic valve: Poorly visualized. Mildly calcified  annulus. Probably trileaflet. Mean gradient (S): 13 mm Hg. Peak gradient (S): 24 mm Hg. Gradients likely increased due to relatively small LVOT and vigorous contraction. Limited views of valve leaflets show grossly preserved excursion. - Mitral valve: Mildly thickened leaflets . There was mild regurgitation. - Left atrium: The atrium was at the upper limits of normal in size. - Right atrium: Central venous pressure (est): 3 mm Hg. - Tricuspid valve: There was trivial regurgitation. - Pulmonary arteries: Systolic pressure could not be accurately estimated. - Pericardium, extracardiac: A prominent pericardial fat pad was present.  Impressions:  - Mild LVH with LVEF 75-80% and grade 2 diastolic dysfunction with increased filling pressures. Normal left atrial chamber size. Mildly thickened mitral leaflets with mild mitral regurgitation. Aortic valve appears mildly sclerotic without definitive stenosis. Trivial tricuspid regurgitation, PASP not estimated.  Assessment and Plan:  1.  Symptomatic, paroxysmal atrial fibrillation.  She has continued on baseline treatment including Tikosyn, Bystolic, and Xarelto.  Most recent recurrent episode began this past Friday  and she has been taking short acting Cardizem since then with adequate heart rate control now.  She tells me that she has thought more about discussion regarding AV node ablation and pacemaker with Dr. Ladona Ridgel and would like to speak with him again about this to make a final decision.  She remains very symptomatic and other treatment options look to be limited.  2.  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  She does participate in pulmonary rehabilitation.  3.  Mixed hyperlipidemia, on Lipitor.  Current medicines were reviewed with the patient today.   Orders Placed This Encounter  Procedures  . EKG 12-Lead    Disposition: Scheduling visit with Dr. Ladona Ridgel for further  discussion.  Signed, Jonelle Sidle, MD, [facility] 12/24/2017 1:40 PM    [facility] [address] [place], [place] [address] Phone: [phone]; Fax: [phone]

## 2017-12-24 ENCOUNTER — Ambulatory Visit: Payer: Medicare Other | Admitting: Cardiology

## 2017-12-24 ENCOUNTER — Encounter: Payer: Self-pay | Admitting: Cardiology

## 2017-12-24 VITALS — BP 128/76 | HR 85 | Ht 66.0 in | Wt 175.0 lb

## 2017-12-24 DIAGNOSIS — J449 Chronic obstructive pulmonary disease, unspecified: Secondary | ICD-10-CM

## 2017-12-24 DIAGNOSIS — E782 Mixed hyperlipidemia: Secondary | ICD-10-CM | POA: Diagnosis not present

## 2017-12-24 DIAGNOSIS — I480 Paroxysmal atrial fibrillation: Secondary | ICD-10-CM | POA: Diagnosis not present

## 2017-12-24 NOTE — Patient Instructions (Addendum)
Your physician wants you to follow-up in: 1 month with Dr.McDowell    Your physician recommends that you continue on your current medications as directed. Please refer to the Current Medication list given to you today.   If you need a refill on your cardiac medications before your next appointment, please call your pharmacy.     No lab work or tests ordered today

## 2017-12-25 ENCOUNTER — Encounter: Payer: Self-pay | Admitting: *Deleted

## 2017-12-25 ENCOUNTER — Other Ambulatory Visit (HOSPITAL_COMMUNITY)
Admission: RE | Admit: 2017-12-25 | Discharge: 2017-12-25 | Disposition: A | Discharge status: Home / Self Care | Payer: Medicare Other | Source: Ambulatory Visit | Attending: Internal Medicine | Admitting: Internal Medicine

## 2017-12-25 ENCOUNTER — Encounter: Payer: Self-pay | Admitting: Internal Medicine

## 2017-12-25 ENCOUNTER — Ambulatory Visit: Payer: Medicare Other | Admitting: Internal Medicine

## 2017-12-25 VITALS — BP 102/56 | HR 62 | Ht 66.0 in | Wt 174.0 lb

## 2017-12-25 DIAGNOSIS — Z79899 Other long term (current) drug therapy: Secondary | ICD-10-CM | POA: Diagnosis present

## 2017-12-25 LAB — CBC WITH DIFFERENTIAL/PLATELET
BASOS ABS: 0 10*3/uL (ref 0.0–0.1)
BASOS PCT: 0 %
Eosinophils Absolute: 0.1 10*3/uL (ref 0.0–0.7)
Eosinophils Relative: 1 %
HEMATOCRIT: 35.8 % — AB (ref 36.0–46.0)
HEMOGLOBIN: 11.7 g/dL — AB (ref 12.0–15.0)
Lymphocytes Relative: 13 %
Lymphs Abs: 0.9 10*3/uL (ref 0.7–4.0)
MCH: 30.9 pg (ref 26.0–34.0)
MCHC: 32.7 g/dL (ref 30.0–36.0)
MCV: 94.5 fL (ref 78.0–100.0)
MONO ABS: 0.7 10*3/uL (ref 0.1–1.0)
Monocytes Relative: 11 %
NEUTROS ABS: 5.2 10*3/uL (ref 1.7–7.7)
NEUTROS PCT: 75 %
Platelets: 191 10*3/uL (ref 150–400)
RBC: 3.79 MIL/uL — ABNORMAL LOW (ref 3.87–5.11)
RDW: 12.5 % (ref 11.5–15.5)
WBC: 6.9 10*3/uL (ref 4.0–10.5)

## 2017-12-25 LAB — BASIC METABOLIC PANEL
ANION GAP: 10 (ref 5–15)
BUN: 19 mg/dL (ref 6–20)
CALCIUM: 9.1 mg/dL (ref 8.9–10.3)
CO2: 34 mmol/L — ABNORMAL HIGH (ref 22–32)
Chloride: 88 mmol/L — ABNORMAL LOW (ref 101–111)
Creatinine, Ser: 1.26 mg/dL — ABNORMAL HIGH (ref 0.44–1.00)
GFR calc Af Amer: 49 mL/min — ABNORMAL LOW (ref >60)
GFR, EST NON AFRICAN AMERICAN: 42 mL/min — AB (ref >60)
Glucose, Bld: 104 mg/dL — ABNORMAL HIGH (ref 65–99)
Potassium: 4.7 mmol/L (ref 3.5–5.1)
Sodium: 132 mmol/L — ABNORMAL LOW (ref 135–145)

## 2017-12-25 NOTE — Patient Instructions (Signed)
Medication Instructions:  Your physician recommends that you continue on your current medications as directed. Please refer to the Current Medication list given to you today.   Labwork: Your physician recommends that you return for lab work in: Today    Testing/Procedures: Your physician has recommended that you have an ablation. Catheter ablation is a medical procedure used to treat some cardiac arrhythmias (irregular heartbeats). During catheter ablation, a long, thin, flexible tube is put into a blood vessel in your groin (upper thigh), or neck. This tube is called an ablation catheter. It is then guided to your heart through the blood vessel. Radio frequency waves destroy small areas of heart tissue where abnormal heartbeats may cause an arrhythmia to start. Please see the instruction sheet given to you today.  Your physician has recommended that you have a defibrillator inserted. An implantable cardioverter defibrillator (ICD) is a small device that is placed in your chest or, in rare cases, your abdomen. This device uses electrical pulses or shocks to help control life-threatening, irregular heartbeats that could lead the heart to suddenly stop beating (sudden cardiac arrest). Leads are attached to the ICD that goes into your heart. This is done in the hospital and usually requires an overnight stay. Please see the instruction sheet given to you today for more information.    Follow-Up: Your physician recommends that you schedule a follow-up appointment in: 14 Days after your procedure in [place] office.      Any Other Special Instructions Will Be Listed Below (If Applicable).     If you need a refill on your cardiac medications before your next appointment, please call your pharmacy.  Thank you for choosing [facility]!

## 2017-12-25 NOTE — Progress Notes (Addendum)
HPI Tracey Dudley returns today for ongoing evaluation of atrial fib with a RVR. The patient was doing well with her dofetilide but developed an URI several weeks ago. She has been out of rhythm since and feels poorly. She saw Dr. Diona Browner yesterday and referred today to discuss additional options. She has COPD and on chronic oxygen therapy. She uses short acting cardizem when her heart is racing. She denies medical non-compliance. When I saw her in February, we discussed AV node ablation and His bundle pacing but she was unsure. Now she would like to proceed. I also offered her a repeat DCCV.  Allergies  Allergen Reactions  . Metoprolol Shortness Of Breath and Other (See Comments)    Tiredness  . Erythromycin Nausea Only     Current Outpatient Medications  Medication Sig Dispense Refill  . acetaminophen (TYLENOL) 500 MG tablet Take 500 mg by mouth every 6 (six) hours as needed for moderate pain.     Marland Kitchen albuterol (PROVENTIL HFA;VENTOLIN HFA) 108 (90 BASE) MCG/ACT inhaler Inhale 1 puff into the lungs every 6 (six) hours as needed for wheezing or shortness of breath.    . ALPRAZolam (XANAX) 0.5 MG tablet Take 0.5-0.75 mg by mouth 3 (three) times daily as needed for anxiety.     . Artificial Tear Solution (SOOTHE XP) SOLN Place 1-2 drops into both eyes daily as needed (dry eyes).     Marland Kitchen atorvastatin (LIPITOR) 20 MG tablet Take 20 mg by mouth daily.    Marland Kitchen BYSTOLIC 20 MG TABS TAKE 1 TABLET BY MOUTH EVERY DAY 30 tablet 6  . diltiazem (CARDIZEM) 30 MG tablet Take 1 tablet (30 mg total) by mouth 3 (three) times daily. PRN for AFIB 270 tablet 3  . dofetilide (TIKOSYN) 250 MCG capsule TAKE ONE CAPSULE BY MOUTH EVERY 12 HOURS 60 capsule 10  . EDARBI 80 MG TABS TAKE 1 TABLET BY MOUTH EVERY DAY 30 tablet 6  . hydrALAZINE (APRESOLINE) 100 MG tablet TAKE 1 TABLET BY MOUTH 3 TIMES A DAY 90 tablet 3  . ondansetron (ZOFRAN ODT) 4 MG disintegrating tablet Take 1 tablet (4 mg total) by mouth every 8 (eight)  hours as needed for nausea or vomiting. 6 tablet 0  . rivaroxaban (XARELTO) 20 MG TABS tablet Take 20 mg by mouth daily with supper.    Marland Kitchen spironolactone (ALDACTONE) 25 MG tablet TAKE 1/2 TABLET (12.5mg ) BY MOUTH EVERY DAY 45 tablet 6  . TRELEGY ELLIPTA 100-62.5-25 MCG/INH AEPB INHALE 1 PUFF BY MOUTH EVERY DAY  0   No current facility-administered medications for this visit.      Past Medical History:  Diagnosis Date  . Anxiety   . Atrial fibrillation (HCC)   . COPD (chronic obstructive pulmonary disease) (HCC)   . Essential hypertension   . Herpes zoster   . Hyperlipidemia   . PAF (paroxysmal atrial fibrillation) (HCC)    Followed by Dr. Leonie Green in [place], also [facility] EP assessment by Dr. Christin Fudge  . Vitamin D deficiency     ROS:   All systems reviewed and negative except as noted in the HPI.   Past Surgical History:  Procedure Laterality Date  . ABDOMINAL HYSTERECTOMY    . CATARACT EXTRACTION       Family History  Problem Relation Age of Onset  . Breast cancer Sister   . CAD Father   . Asthma Mother      Social History   Socioeconomic History  . Marital status: Married  Spouse name: Not on file  . Number of children: Not on file  . Years of education: Not on file  . Highest education level: Not on file  Occupational History  . Not on file  Social Needs  . Financial resource strain: Not on file  . Food insecurity:    Worry: Not on file    Inability: Not on file  . Transportation needs:    Medical: Not on file    Non-medical: Not on file  Tobacco Use  . Smoking status: Former Smoker    Packs/day: 0.50    Years: 30.00    Pack years: 15.00    Types: Cigarettes    Start date: 11/03/1977    Last attempt to quit: 06/01/2014    Years since quitting: 3.5  . Smokeless tobacco: Never Used  Substance and Sexual Activity  . Alcohol use: No    Alcohol/week: 0.0 oz  . Drug use: No  . Sexual activity: Yes  Lifestyle  . Physical activity:    Days per week:  Not on file    Minutes per session: Not on file  . Stress: Not on file  Relationships  . Social connections:    Talks on phone: Not on file    Gets together: Not on file    Attends religious service: Not on file    Active member of club or organization: Not on file    Attends meetings of clubs or organizations: Not on file    Relationship status: Not on file  . Intimate partner violence:    Fear of current or ex partner: Not on file    Emotionally abused: Not on file    Physically abused: Not on file    Forced sexual activity: Not on file  Other Topics Concern  . Not on file  Social History Narrative  . Not on file     BP (!) 102/56 (BP Location: Left Arm)   Pulse 62   Ht 5\' 6"  (1.676 m)   Wt 174 lb (78.9 kg)   SpO2 96%   BMI 28.08 kg/m   Physical Exam:  Chronically ill appearing NAD HEENT: Unremarkable except wearing oxygen by nasal cannula Neck:  6 cm JVD, no thyromegally Lymphatics:  No adenopathy Back:  No CVA tenderness Lungs:  Clear with no wheezes breath sounds are reduced HEART:  Regular rate rhythm, no murmurs, no rubs, no clicks Abd:  soft, positive bowel sounds, no organomegally, no rebound, no guarding Ext:  2 plus pulses, no edema, no cyanosis, no clubbing Skin:  No rashes no nodules Neuro:  CN II through XII intact, motor grossly intact  EKG - reviewed atrial fib with a RVR   Assess/Plan: 1. Atrial fib - we discussed the treatment options in detail. Her VR is not well controlled and she feels poorly. I have offered her AV node ablation and insertion of a PPM as well as repeating her DCCV now that her URI is improved. The risks/benefits/goals/expectations of the procedure have been discussed with the patient and she wishes to proceed. 2. HTN - her blood pressure is well controlled. Hopefully she can reduce or eliminate her AV nodal blocking drugs after PPM/AV node RFA 3. COPD - she is not actively wheezing today. She will continue her oxygen.   [name]  Haruki Arnold,M.D.

## 2017-12-25 NOTE — H&P (View-Only) (Signed)
HPI Tracey Dudley returns today for ongoing evaluation of atrial fib with a RVR. The patient was doing well with her dofetilide but developed an URI several weeks ago. She has been out of rhythm since and feels poorly. She saw Dr. Diona Browner yesterday and referred today to discuss additional options. She has COPD and on chronic oxygen therapy. She uses short acting cardizem when her heart is racing. She denies medical non-compliance. When I saw her in February, we discussed AV node ablation and His bundle pacing but she was unsure. Now she would like to proceed. I also offered her a repeat DCCV.  Allergies  Allergen Reactions  . Metoprolol Shortness Of Breath and Other (See Comments)    Tiredness  . Erythromycin Nausea Only     Current Outpatient Medications  Medication Sig Dispense Refill  . acetaminophen (TYLENOL) 500 MG tablet Take 500 mg by mouth every 6 (six) hours as needed for moderate pain.     Marland Kitchen albuterol (PROVENTIL HFA;VENTOLIN HFA) 108 (90 BASE) MCG/ACT inhaler Inhale 1 puff into the lungs every 6 (six) hours as needed for wheezing or shortness of breath.    . ALPRAZolam (XANAX) 0.5 MG tablet Take 0.5-0.75 mg by mouth 3 (three) times daily as needed for anxiety.     . Artificial Tear Solution (SOOTHE XP) SOLN Place 1-2 drops into both eyes daily as needed (dry eyes).     Marland Kitchen atorvastatin (LIPITOR) 20 MG tablet Take 20 mg by mouth daily.    Marland Kitchen BYSTOLIC 20 MG TABS TAKE 1 TABLET BY MOUTH EVERY DAY 30 tablet 6  . diltiazem (CARDIZEM) 30 MG tablet Take 1 tablet (30 mg total) by mouth 3 (three) times daily. PRN for AFIB 270 tablet 3  . dofetilide (TIKOSYN) 250 MCG capsule TAKE ONE CAPSULE BY MOUTH EVERY 12 HOURS 60 capsule 10  . EDARBI 80 MG TABS TAKE 1 TABLET BY MOUTH EVERY DAY 30 tablet 6  . hydrALAZINE (APRESOLINE) 100 MG tablet TAKE 1 TABLET BY MOUTH 3 TIMES A DAY 90 tablet 3  . ondansetron (ZOFRAN ODT) 4 MG disintegrating tablet Take 1 tablet (4 mg total) by mouth every 8 (eight)  hours as needed for nausea or vomiting. 6 tablet 0  . rivaroxaban (XARELTO) 20 MG TABS tablet Take 20 mg by mouth daily with supper.    Marland Kitchen spironolactone (ALDACTONE) 25 MG tablet TAKE 1/2 TABLET (12.5mg ) BY MOUTH EVERY DAY 45 tablet 6  . TRELEGY ELLIPTA 100-62.5-25 MCG/INH AEPB INHALE 1 PUFF BY MOUTH EVERY DAY  0   No current facility-administered medications for this visit.      Past Medical History:  Diagnosis Date  . Anxiety   . Atrial fibrillation (HCC)   . COPD (chronic obstructive pulmonary disease) (HCC)   . Essential hypertension   . Herpes zoster   . Hyperlipidemia   . PAF (paroxysmal atrial fibrillation) (HCC)    Followed by Dr. Leonie Green in [place], also [facility] EP assessment by Dr. Christin Fudge  . Vitamin D deficiency     ROS:   All systems reviewed and negative except as noted in the HPI.   Past Surgical History:  Procedure Laterality Date  . ABDOMINAL HYSTERECTOMY    . CATARACT EXTRACTION       Family History  Problem Relation Age of Onset  . Breast cancer Sister   . CAD Father   . Asthma Mother      Social History   Socioeconomic History  . Marital status: Married  Spouse name: Not on file  . Number of children: Not on file  . Years of education: Not on file  . Highest education level: Not on file  Occupational History  . Not on file  Social Needs  . Financial resource strain: Not on file  . Food insecurity:    Worry: Not on file    Inability: Not on file  . Transportation needs:    Medical: Not on file    Non-medical: Not on file  Tobacco Use  . Smoking status: Former Smoker    Packs/day: 0.50    Years: 30.00    Pack years: 15.00    Types: Cigarettes    Start date: 11/03/1977    Last attempt to quit: 06/01/2014    Years since quitting: 3.5  . Smokeless tobacco: Never Used  Substance and Sexual Activity  . Alcohol use: No    Alcohol/week: 0.0 oz  . Drug use: No  . Sexual activity: Yes  Lifestyle  . Physical activity:    Days per week:  Not on file    Minutes per session: Not on file  . Stress: Not on file  Relationships  . Social connections:    Talks on phone: Not on file    Gets together: Not on file    Attends religious service: Not on file    Active member of club or organization: Not on file    Attends meetings of clubs or organizations: Not on file    Relationship status: Not on file  . Intimate partner violence:    Fear of current or ex partner: Not on file    Emotionally abused: Not on file    Physically abused: Not on file    Forced sexual activity: Not on file  Other Topics Concern  . Not on file  Social History Narrative  . Not on file     BP (!) 102/56 (BP Location: Left Arm)   Pulse 62   Ht 5\' 6"  (1.676 m)   Wt 174 lb (78.9 kg)   SpO2 96%   BMI 28.08 kg/m   Physical Exam:  Chronically ill appearing NAD HEENT: Unremarkable except wearing oxygen by nasal cannula Neck:  6 cm JVD, no thyromegally Lymphatics:  No adenopathy Back:  No CVA tenderness Lungs:  Clear with no wheezes breath sounds are reduced HEART:  Regular rate rhythm, no murmurs, no rubs, no clicks Abd:  soft, positive bowel sounds, no organomegally, no rebound, no guarding Ext:  2 plus pulses, no edema, no cyanosis, no clubbing Skin:  No rashes no nodules Neuro:  CN II through XII intact, motor grossly intact  EKG - reviewed atrial fib with a RVR   Assess/Plan: 1. Atrial fib - we discussed the treatment options in detail. Her VR is not well controlled and she feels poorly. I have offered her AV node ablation and insertion of a PPM as well as repeating her DCCV now that her URI is improved. The risks/benefits/goals/expectations of the procedure have been discussed with the patient and she wishes to proceed. 2. HTN - her blood pressure is well controlled. Hopefully she can reduce or eliminate her AV nodal blocking drugs after PPM/AV node RFA 3. COPD - she is not actively wheezing today. She will continue her oxygen.   Tracey ReevesGregg  Dudley,M.D.

## 2017-12-26 ENCOUNTER — Other Ambulatory Visit: Payer: Self-pay | Admitting: Cardiology

## 2018-01-01 ENCOUNTER — Other Ambulatory Visit: Payer: Self-pay | Admitting: Cardiology

## 2018-01-02 ENCOUNTER — Encounter (HOSPITAL_COMMUNITY): Payer: Self-pay | Admitting: General Practice

## 2018-01-02 ENCOUNTER — Other Ambulatory Visit: Payer: Self-pay

## 2018-01-02 ENCOUNTER — Ambulatory Visit (HOSPITAL_COMMUNITY)
Admission: RE | Admit: 2018-01-02 | Discharge: 2018-01-03 | Disposition: A | Discharge status: Home / Self Care | Payer: Medicare Other | Source: Ambulatory Visit | Attending: Internal Medicine | Admitting: Internal Medicine

## 2018-01-02 ENCOUNTER — Encounter (HOSPITAL_COMMUNITY)
Admission: RE | Disposition: A | Discharge status: Home / Self Care | Payer: Self-pay | Source: Ambulatory Visit | Attending: Internal Medicine

## 2018-01-02 DIAGNOSIS — E559 Vitamin D deficiency, unspecified: Secondary | ICD-10-CM | POA: Diagnosis not present

## 2018-01-02 DIAGNOSIS — F419 Anxiety disorder, unspecified: Secondary | ICD-10-CM | POA: Diagnosis not present

## 2018-01-02 DIAGNOSIS — I4891 Unspecified atrial fibrillation: Secondary | ICD-10-CM | POA: Diagnosis present

## 2018-01-02 DIAGNOSIS — I4819 Other persistent atrial fibrillation: Secondary | ICD-10-CM | POA: Diagnosis present

## 2018-01-02 DIAGNOSIS — Z9981 Dependence on supplemental oxygen: Secondary | ICD-10-CM | POA: Insufficient documentation

## 2018-01-02 DIAGNOSIS — Z7901 Long term (current) use of anticoagulants: Secondary | ICD-10-CM | POA: Diagnosis not present

## 2018-01-02 DIAGNOSIS — Z87891 Personal history of nicotine dependence: Secondary | ICD-10-CM | POA: Diagnosis not present

## 2018-01-02 DIAGNOSIS — J449 Chronic obstructive pulmonary disease, unspecified: Secondary | ICD-10-CM | POA: Diagnosis not present

## 2018-01-02 DIAGNOSIS — I481 Persistent atrial fibrillation: Secondary | ICD-10-CM | POA: Insufficient documentation

## 2018-01-02 DIAGNOSIS — I10 Essential (primary) hypertension: Secondary | ICD-10-CM | POA: Insufficient documentation

## 2018-01-02 DIAGNOSIS — E785 Hyperlipidemia, unspecified: Secondary | ICD-10-CM | POA: Insufficient documentation

## 2018-01-02 DIAGNOSIS — Z950 Presence of cardiac pacemaker: Secondary | ICD-10-CM

## 2018-01-02 HISTORY — DX: Presence of cardiac pacemaker: Z95.0

## 2018-01-02 HISTORY — PX: PACEMAKER IMPLANT: EP1218

## 2018-01-02 HISTORY — PX: INSERT / REPLACE / REMOVE PACEMAKER: SUR710

## 2018-01-02 HISTORY — PX: AV NODE ABLATION: EP1193

## 2018-01-02 HISTORY — PX: ABLATION OF DYSRHYTHMIC FOCUS: SHX254

## 2018-01-02 LAB — SURGICAL PCR SCREEN
MRSA, PCR: NEGATIVE
STAPHYLOCOCCUS AUREUS: NEGATIVE

## 2018-01-02 SURGERY — AV NODE ABLATION

## 2018-01-02 MED ORDER — SODIUM CHLORIDE 0.9 % IV SOLN
INTRAVENOUS | Status: DC
  Administered 2018-01-02: 07:00:00 via INTRAVENOUS

## 2018-01-02 MED ORDER — BUPIVACAINE HCL (PF) 0.25 % IJ SOLN
INTRAMUSCULAR | Status: DC | PRN
  Administered 2018-01-02: 20 mL

## 2018-01-02 MED ORDER — MUPIROCIN 2 % EX OINT
1.0000 | TOPICAL_OINTMENT | Freq: Once | CUTANEOUS | Status: DC
Start: 2018-01-02 — End: 2018-01-03

## 2018-01-02 MED ORDER — CEFAZOLIN SODIUM-DEXTROSE 2-4 GM/100ML-% IV SOLN
INTRAVENOUS | Status: AC
  Filled 2018-01-02: qty 100

## 2018-01-02 MED ORDER — CEFAZOLIN SODIUM-DEXTROSE 2-4 GM/100ML-% IV SOLN
2.0000 g | INTRAVENOUS | Status: AC
  Administered 2018-01-02: 2 g via INTRAVENOUS
  Filled 2018-01-02: qty 100

## 2018-01-02 MED ORDER — ALBUTEROL SULFATE (2.5 MG/3ML) 0.083% IN NEBU: 2.5000 mg | INHALATION_SOLUTION | Freq: Four times a day (QID) | RESPIRATORY_TRACT | Status: DC | PRN

## 2018-01-02 MED ORDER — IOPAMIDOL (ISOVUE-370) INJECTION 76%
INTRAVENOUS | Status: AC
  Filled 2018-01-02: qty 50

## 2018-01-02 MED ORDER — HEPARIN SODIUM (PORCINE) 1000 UNIT/ML IJ SOLN: INTRAMUSCULAR | Status: DC | PRN

## 2018-01-02 MED ORDER — ATORVASTATIN CALCIUM 20 MG PO TABS
20.0000 mg | ORAL_TABLET | Freq: Every evening | ORAL | Status: DC
  Administered 2018-01-02: 20 mg via ORAL
  Filled 2018-01-02: qty 1

## 2018-01-02 MED ORDER — HEPARIN (PORCINE) IN NACL 1000-0.9 UT/500ML-% IV SOLN
INTRAVENOUS | Status: AC
  Filled 2018-01-02: qty 500

## 2018-01-02 MED ORDER — LIDOCAINE HCL (PF) 1 % IJ SOLN
INTRAMUSCULAR | Status: DC | PRN
  Administered 2018-01-02: 60 mL

## 2018-01-02 MED ORDER — FENTANYL CITRATE (PF) 100 MCG/2ML IJ SOLN
INTRAMUSCULAR | Status: AC
  Filled 2018-01-02: qty 2

## 2018-01-02 MED ORDER — FENTANYL CITRATE (PF) 100 MCG/2ML IJ SOLN
25.0000 ug | INTRAMUSCULAR | Status: DC | PRN
  Administered 2018-01-02 (×2): 50 ug via INTRAVENOUS
  Filled 2018-01-02: qty 2

## 2018-01-02 MED ORDER — MUPIROCIN 2 % EX OINT
TOPICAL_OINTMENT | CUTANEOUS | Status: AC
  Administered 2018-01-02: 1
  Filled 2018-01-02: qty 22

## 2018-01-02 MED ORDER — SODIUM CHLORIDE 0.9 % IV SOLN
80.0000 mg | INTRAVENOUS | Status: AC
  Administered 2018-01-02: 80 mg
  Filled 2018-01-02: qty 2

## 2018-01-02 MED ORDER — ACETAMINOPHEN 325 MG PO TABS: 325.0000 mg | ORAL_TABLET | ORAL | Status: DC | PRN

## 2018-01-02 MED ORDER — ONDANSETRON HCL 4 MG/2ML IJ SOLN
INTRAMUSCULAR | Status: AC
  Filled 2018-01-02: qty 2

## 2018-01-02 MED ORDER — ALPRAZOLAM 0.25 MG PO TABS: 0.2500 mg | ORAL_TABLET | Freq: Three times a day (TID) | ORAL | Status: DC | PRN

## 2018-01-02 MED ORDER — IRBESARTAN 300 MG PO TABS
300.0000 mg | ORAL_TABLET | Freq: Every day | ORAL | Status: DC
  Administered 2018-01-03: 300 mg via ORAL
  Filled 2018-01-02: qty 1

## 2018-01-02 MED ORDER — HEPARIN (PORCINE) IN NACL 2-0.9 UNITS/ML
INTRAMUSCULAR | Status: AC | PRN
  Administered 2018-01-02: 1000 mL

## 2018-01-02 MED ORDER — HYDRALAZINE HCL 50 MG PO TABS
100.0000 mg | ORAL_TABLET | Freq: Three times a day (TID) | ORAL | Status: DC
  Administered 2018-01-02 – 2018-01-03 (×2): 100 mg via ORAL
  Filled 2018-01-02 (×2): qty 2

## 2018-01-02 MED ORDER — BUPIVACAINE HCL (PF) 0.25 % IJ SOLN
INTRAMUSCULAR | Status: AC
  Filled 2018-01-02: qty 30

## 2018-01-02 MED ORDER — SOOTHE XP OP SOLN
1.0000 [drp] | Freq: Every day | OPHTHALMIC | Status: DC | PRN
Start: 2018-01-02 — End: 2018-01-02

## 2018-01-02 MED ORDER — CHLORHEXIDINE GLUCONATE 4 % EX LIQD
60.0000 mL | Freq: Once | CUTANEOUS | Status: DC
  Filled 2018-01-02: qty 60

## 2018-01-02 MED ORDER — ONDANSETRON HCL 4 MG/2ML IJ SOLN
4.0000 mg | Freq: Four times a day (QID) | INTRAMUSCULAR | Status: DC | PRN
  Administered 2018-01-02 – 2018-01-03 (×4): 4 mg via INTRAVENOUS
  Filled 2018-01-02 (×3): qty 2

## 2018-01-02 MED ORDER — SPIRONOLACTONE 12.5 MG HALF TABLET
12.5000 mg | ORAL_TABLET | Freq: Every day | ORAL | Status: DC
  Administered 2018-01-03: 12.5 mg via ORAL
  Filled 2018-01-02: qty 1

## 2018-01-02 MED ORDER — CEFAZOLIN SODIUM-DEXTROSE 1-4 GM/50ML-% IV SOLN
1.0000 g | Freq: Four times a day (QID) | INTRAVENOUS | Status: AC
  Administered 2018-01-02 – 2018-01-03 (×3): 1 g via INTRAVENOUS
  Filled 2018-01-02 (×4): qty 50

## 2018-01-02 MED ORDER — FENTANYL CITRATE (PF) 100 MCG/2ML IJ SOLN
INTRAMUSCULAR | Status: DC | PRN
  Administered 2018-01-02 (×5): 12.5 ug via INTRAVENOUS

## 2018-01-02 MED ORDER — RIVAROXABAN 20 MG PO TABS
20.0000 mg | ORAL_TABLET | Freq: Every day | ORAL | Status: DC
  Administered 2018-01-02: 20 mg via ORAL
  Filled 2018-01-02: qty 1

## 2018-01-02 MED ORDER — KETOROLAC TROMETHAMINE 15 MG/ML IJ SOLN
15.0000 mg | Freq: Three times a day (TID) | INTRAMUSCULAR | Status: DC | PRN
  Administered 2018-01-03: 15 mg via INTRAVENOUS
  Filled 2018-01-02 (×2): qty 1

## 2018-01-02 MED ORDER — LIDOCAINE HCL 1 % IJ SOLN
INTRAMUSCULAR | Status: AC
  Filled 2018-01-02: qty 60

## 2018-01-02 MED ORDER — MIDAZOLAM HCL 5 MG/5ML IJ SOLN
INTRAMUSCULAR | Status: AC
  Filled 2018-01-02: qty 5

## 2018-01-02 MED ORDER — MIDAZOLAM HCL 5 MG/5ML IJ SOLN
INTRAMUSCULAR | Status: DC | PRN
  Administered 2018-01-02: 1 mg via INTRAVENOUS
  Administered 2018-01-02 (×2): 1 mg
  Administered 2018-01-02: 1 mg via INTRAVENOUS
  Administered 2018-01-02: 1 mg

## 2018-01-02 MED ORDER — SODIUM CHLORIDE 0.9 % IV SOLN
INTRAVENOUS | Status: AC
  Filled 2018-01-02: qty 2

## 2018-01-02 SURGICAL SUPPLY — 15 items
BAG SNAP BAND KOVER 36X36 (MISCELLANEOUS) ×3
CABLE SURGICAL S-101-97-12 (CABLE) ×3
CATH CELSIUS THERMO F CV 7FR (ABLATOR) ×3
CATH RIGHTSITE C315HIS02 (CATHETERS) ×3
LEAD CAPSURE NOVUS 5076-52CM (Lead) ×3 IMPLANT
PACE AZURE XT DR MRI W1DR01 (Pacemaker) ×3 IMPLANT
PACK EP LATEX FREE (CUSTOM PROCEDURE TRAY) ×2
PACK EP LF (CUSTOM PROCEDURE TRAY) ×1
PAD DEFIB LIFELINK (PAD) ×3
SELECT SECURE 3830 383069 (Lead) ×3 IMPLANT
SHEATH CLASSIC 7F (SHEATH) ×6
SHEATH PINNACLE 8F 10CM (SHEATH) ×3
SLITTER 6232ADJ (MISCELLANEOUS) ×3
TRAY PACEMAKER INSERTION (PACKS) ×3
WIRE HI TORQ VERSACORE-J 145CM (WIRE) ×3

## 2018-01-02 NOTE — Discharge Summary (Addendum)
ELECTROPHYSIOLOGY PROCEDURE DISCHARGE SUMMARY    Patient ID: Tracey Dudley,  MRN: [id number], DOB/AGE: [birth year] 72 y.o.  Admit date: 01/02/2018 Discharge date: 01/03/18  Primary Care Physician: Lenoria Chime, FNP Primary Cardiologist: Dr. Diona Browner Electrophysiologist: Dr. Ladona Ridgel  Primary Discharge Diagnosis:  1. Permanent Afib, uncontrolled rates     CHA2DS2Vasc is 2, on Xarelto  Secondary Discharge Diagnosis:  1. COPD     On home O2 2. HLD 3. Anxiety  Allergies  Allergen Reactions  . Metoprolol Shortness Of Breath and Other (See Comments)    Tiredness  . Erythromycin Nausea Only     Procedures This Admission:  1.  Implantation of a MDT dual chamber PPM on 01/02/18 by Dr Ladona Ridgel.  The patient received a Medtronic (serial number [id number]) pacemaker, Medtronic (serial number [id number]) right atrial lead and a Medtronic (serial number [id number]) right ventricular/His bundle lead  There were no immediate post procedure complications. 2. AVNode ablation 01/02/18 the patient's AV conduction continued to recur.  Because of the concern for creation of heart block distal to the pacing lead no additional ablations were delivered 3.  CXR on 01/03/18 demonstrated no pneumothorax status post device implantation.   Brief HPI: Tracey Dudley is a 72 y.o. female wasis followed by electrophysiology in the outpatient setting for afib, poorly controlled rates.  Past medical history is noted above.  The patient has had symptomatic AFib with poorly controlled rates, and planned for PPM implant w/AV node ablation.  Risks, benefits, and alternatives to PPM implantation and AV node ablation were reviewed with the patient who wished to proceed.   Hospital Course:  The patient was admitted and underwent implantation of a PPM followed by AV node ablation which was incompplete with details as outlined above. She was monitored on telemetry overnight which demonstrated SR.  Left chest was without  hematoma or ecchymosis. R groin site stable. The device was interrogated and found to be functioning normally.  CXR was obtained and demonstrated no pneumothorax status post device implantation.  Wound care, arm mobility, and restrictions were reviewed with the patient.  The patient denies any CP, SOB, was examined by Dr. Ladona Ridgel and considered stable for discharge to home.   The patient reports taking her Tikosyn up to admission, d/w Dr. Ladona Ridgel, will continue and resume this evening at her usual dose/time.   Physical Exam: Vitals:   01/03/18 0500 01/03/18 0505 01/03/18 0754 01/03/18 1154  BP: 131/62  (!) 125/51 137/67  Pulse: 75  69 68  Resp: 18  18 18   Temp: 98 F (36.7 C)  98 F (36.7 C) 98.1 F (36.7 C)  TempSrc: Oral  Oral Oral  SpO2: 95%  100% 96%  Weight:  172 lb 11.2 oz (78.3 kg)    Height:        GEN- The patient is well appearing, alert and oriented x 3 today.   HEENT: normocephalic, atraumatic; sclera clear, conjunctiva pink; hearing intact; oropharynx clear; neck supple, no JVP Lungs- CTA b/l, normal work of breathing.  No wheezes, rales, rhonchi Heart- RRR, no murmurs, rubs or gallops, PMI not laterally displaced GI- soft, non-tender, non-distended Extremities- no clubbing, cyanosis, or edema, R groin site is stable, no bleeding, discomfort or hematoma MS- no significant deformity or atrophy Skin- warm and dry, no rash or lesion, left chest without hematoma/ecchymosis Psych- euthymic mood, full affect Neuro- no gross deficits   Labs:   Lab Results  Component Value Date   WBC 6.9 12/25/2017  HGB 11.7 (L) 12/25/2017   HCT 35.8 (L) 12/25/2017   MCV 94.5 12/25/2017   PLT 191 12/25/2017   No results for input(s): NA, K, CL, CO2, BUN, CREATININE, CALCIUM, PROT, BILITOT, ALKPHOS, ALT, AST, GLUCOSE in the last 168 hours.  Invalid input(s): LABALBU  Discharge Medications:  Allergies as of 01/03/2018      Reactions   Metoprolol Shortness Of Breath, Other (See  Comments)   Tiredness   Erythromycin Nausea Only      Medication List    STOP taking these medications   ondansetron 4 MG disintegrating tablet Commonly known as:  ZOFRAN ODT     TAKE these medications   acetaminophen 500 MG tablet Commonly known as:  TYLENOL Take 500 mg by mouth every 6 (six) hours as needed for moderate pain or headache.   albuterol 108 (90 Base) MCG/ACT inhaler Commonly known as:  PROVENTIL HFA;VENTOLIN HFA Inhale 1 puff into the lungs every 6 (six) hours as needed for wheezing or shortness of breath.   ALPRAZolam 0.5 MG tablet Commonly known as:  XANAX Take 0.25-1 mg by mouth 3 (three) times daily as needed for anxiety.   atorvastatin 20 MG tablet Commonly known as:  LIPITOR Take 20 mg by mouth every evening.   BYSTOLIC 20 MG Tabs Generic drug:  Nebivolol HCl TAKE 1 TABLET BY MOUTH EVERY DAY What changed:    how much to take  how to take this  when to take this   diltiazem 30 MG tablet Commonly known as:  CARDIZEM Take 1 tablet (30 mg total) by mouth 3 (three) times daily. PRN for AFIB What changed:    when to take this  reasons to take this  additional instructions   dofetilide 250 MCG capsule Commonly known as:  TIKOSYN TAKE ONE CAPSULE BY MOUTH EVERY 12 HOURS What changed:  See the new instructions.   EDARBI 80 MG Tabs Generic drug:  Azilsartan Medoxomil TAKE 1 TABLET BY MOUTH EVERY DAY What changed:    how much to take  how to take this  when to take this   hydrALAZINE 100 MG tablet Commonly known as:  APRESOLINE TAKE 100 MG BY MOUTH 3 TIMES A DAY   OXYGEN Inhale 2 L into the lungs continuous.   rivaroxaban 20 MG Tabs tablet Commonly known as:  XARELTO Take 20 mg by mouth daily with supper.   SOOTHE XP Soln Place 1-2 drops into both eyes daily as needed (dry eyes).   spironolactone 25 MG tablet Commonly known as:  ALDACTONE TAKE 1/2 TABLET (12.5mg ) BY MOUTH EVERY DAY   TRELEGY ELLIPTA 100-62.5-25 MCG/INH  Aepb Generic drug:  Fluticasone-Umeclidin-Vilant INHALE 1 PUFF BY MOUTH EVERY DAY       Disposition:  Home Discharge Instructions    Diet - low sodium heart healthy   Complete by:  As directed    Increase activity slowly   Complete by:  As directed      Follow-up Information    [facility] Follow up on 01/08/2018.   Specialty:  Cardiology Why:  9:00AM, wound check visit Contact information: [address], [address] [place] [id number] [phone]       Marinus Mawaylor, Gregg W, MD Follow up on 03/27/2018.   Specialty:  Cardiology Why:  8:45AM Contact information: [address] [place] [id number] [phone]           Duration of Discharge Encounter: Greater than 30 minutes including physician time.  Norma Fredrickson, PA-C 01/03/2018 12:11 PM  EP Attending  Patient seen and examined. Agree with above. The patient is stable today althought a little anxious. PPM interogation under my direction demonstrated normal DDD PM function. She remains in NSR. CXR is ok. DC home with usual followup.  Leonia Reeves.D.

## 2018-01-02 NOTE — Interval H&P Note (Signed)
History and Physical Interval Note:  01/02/2018 7:22 AM  Tracey KindredLinda E Dudley  has presented today for surgery, with the diagnosis of afib  The various methods of treatment have been discussed with the patient and family. After consideration of risks, benefits and other options for treatment, the patient has consented to  Procedure(s): AV NODE ABLATION (N/A) PACEMAKER IMPLANT - Dual Chamber (N/A) as a surgical intervention .  The patient's history has been reviewed, patient examined, no change in status, stable for surgery.  I have reviewed the patient's chart and labs.  Questions were answered to the patient's satisfaction.     Lewayne BuntingGregg Taylor

## 2018-01-02 NOTE — Progress Notes (Signed)
MD verbal order to place foley if pt retains urine again. RN informed MD that unit policy requires us to in/out cath pt three times before placing foley. RN informed MD that she will in/out cath pt again if needed and notify him to discuss further. MD states this is okay.

## 2018-01-02 NOTE — Progress Notes (Signed)
Pt states she feels like she needs to void, but is unable to. RN bladder scanned pt. Bladder scan= 675 mL. RN did an in/out cath on pt. Urine output= 900 mL. MD notified.

## 2018-01-03 ENCOUNTER — Other Ambulatory Visit: Payer: Self-pay

## 2018-01-03 ENCOUNTER — Ambulatory Visit (HOSPITAL_COMMUNITY): Payer: Medicare Other

## 2018-01-03 ENCOUNTER — Encounter (HOSPITAL_COMMUNITY): Payer: Self-pay | Admitting: Internal Medicine

## 2018-01-03 DIAGNOSIS — I481 Persistent atrial fibrillation: Secondary | ICD-10-CM

## 2018-01-03 DIAGNOSIS — J449 Chronic obstructive pulmonary disease, unspecified: Secondary | ICD-10-CM | POA: Diagnosis not present

## 2018-01-03 DIAGNOSIS — I10 Essential (primary) hypertension: Secondary | ICD-10-CM | POA: Diagnosis not present

## 2018-01-03 DIAGNOSIS — Z950 Presence of cardiac pacemaker: Secondary | ICD-10-CM

## 2018-01-03 DIAGNOSIS — E785 Hyperlipidemia, unspecified: Secondary | ICD-10-CM | POA: Diagnosis not present

## 2018-01-03 MED ORDER — ALPRAZOLAM 0.5 MG PO TABS
0.5000 mg | ORAL_TABLET | Freq: Three times a day (TID) | ORAL | Status: DC | PRN
  Administered 2018-01-03: 0.5 mg via ORAL
  Filled 2018-01-03: qty 1

## 2018-01-03 MED FILL — Heparin Sod (Porcine)-NaCl IV Soln 1000 Unit/500ML-0.9%: INTRAVENOUS | Qty: 1000 | Status: AC

## 2018-01-03 MED FILL — Lidocaine HCl Local Inj 1%: INTRAMUSCULAR | Qty: 60 | Status: AC

## 2018-01-03 NOTE — Discharge Instructions (Signed)
Groin procedure site care No lifting over 5 lbs for 1 week. No vigorous or activity for 1 week. Keep procedure site clean & dry. If you notice increased pain, swelling, bleeding or pus, call/return!  No soaking baths/hot tubs/pools for 1 week.      Supplemental Discharge Instructions for  Pacemaker/Defibrillator Patients  Activity No heavy lifting or vigorous activity with your left/right arm for 6 to 8 weeks.  Do not raise your left/right arm above your head for one week.  Gradually raise your affected arm as drawn below.              01/06/18                    01/07/18                    01/08/18                   01/09/18 __  NO DRIVING for   1 week   ; you may begin driving on  1/61/09  .  WOUND CARE - Keep the wound area clean and dry.  Do not get this area wet for one week. No showers for one week; you may shower on  01/09/18  . - The tape/steri-strips on your wound will fall off; do not pull them off.  No bandage is needed on the site.  DO  NOT apply any creams, oils, or ointments to the wound area. - If you notice any drainage or discharge from the wound, any swelling or bruising at the site, or you develop a fever > 101? F after you are discharged home, call the office at once.  Special Instructions - You are still able to use cellular telephones; use the ear opposite the side where you have your pacemaker/defibrillator.  Avoid carrying your cellular phone near your device. - When traveling through airports, show security personnel your identification card to avoid being screened in the metal detectors.  Ask the security personnel to use the hand wand. - Avoid arc welding equipment, MRI testing (magnetic resonance imaging), TENS units (transcutaneous nerve stimulators).  Call the office for questions about other devices. - Avoid electrical appliances that are in poor condition or are not properly grounded. - Microwave ovens are safe to be near or to  operate.   ===========================================================================================  Information on my medicine - XARELTO (Rivaroxaban)  Why was Xarelto prescribed for you? Xarelto was prescribed for you to reduce the risk of a blood clot forming that can cause a stroke if you have a medical condition called atrial fibrillation (a type of irregular heartbeat).  What do you need to know about xarelto ? Take your Xarelto ONCE DAILY at the same time every day with your evening meal. If you have difficulty swallowing the tablet whole, you may crush it and mix in applesauce just prior to taking your dose.  Take Xarelto exactly as prescribed by your doctor and DO NOT stop taking Xarelto without talking to the doctor who prescribed the medication.  Stopping without other stroke prevention medication to take the place of Xarelto may increase your risk of developing a clot that causes a stroke.  Refill your prescription before you run out.  After discharge, you should have regular check-up appointments with your healthcare provider that is prescribing your Xarelto.  In the future your dose may need to be changed if your kidney function or weight changes by a significant amount.  What do you do if you miss a dose? If you are taking Xarelto ONCE DAILY and you miss a dose, take it as soon as you remember on the same day then continue your regularly scheduled once daily regimen the next day. Do not take two doses of Xarelto at the same time or on the same day.   Important Safety Information A possible side effect of Xarelto is bleeding. You should call your healthcare provider right away if you experience any of the following: ? Bleeding from an injury or your nose that does not stop. ? Unusual colored urine (red or dark brown) or unusual colored stools (red or black). ? Unusual bruising for unknown reasons. ? A serious fall or if you hit your head (even if there is no  bleeding).  Some medicines may interact with Xarelto and might increase your risk of bleeding while on Xarelto. To help avoid this, consult your healthcare provider or pharmacist prior to using any new prescription or non-prescription medications, including herbals, vitamins, non-steroidal anti-inflammatory drugs (NSAIDs) and supplements.  This website has more information on Xarelto: [email or web address].xarelto.[email or web address].

## 2018-01-06 ENCOUNTER — Other Ambulatory Visit: Payer: Self-pay

## 2018-01-06 MED ORDER — AZILSARTAN MEDOXOMIL 80 MG PO TABS: ORAL_TABLET | ORAL | 3 refills | Status: DC

## 2018-01-06 NOTE — Telephone Encounter (Signed)
Per fax request from [facility] mail order, 90 day supply edarbi

## 2018-01-07 ENCOUNTER — Telehealth: Payer: Self-pay | Admitting: Internal Medicine

## 2018-01-07 NOTE — Telephone Encounter (Signed)
Spoke with patient who describes an itching rash with pinpoint red bumps bilaterally across her chest, breasts, shoulders as well as surrounding her groin where access was gained during the procedure. She states that she noticed this rash at discharge and has been applying Cortizone cream since then. She states that the rash no longer itches since applying the Cortizone cream but has not noticed any improvement in the overall appearance. I advised that she continue applying the Cortizone cream as it sounds like a topical reaction to a cleanser applied to her skin prior to the procedure. I will also forward to Dr. Ladona Ridgelaylor for any additional recommendations. Wound check scheduled for 4/30.

## 2018-01-07 NOTE — Telephone Encounter (Signed)
I agree. It should bet better and is likely due to the cleaning solution. Continue the topical steroid. After 10 more days, stop and see. Can take benadryl if itching returns. GT

## 2018-01-07 NOTE — Telephone Encounter (Signed)
New message    Patient calling with concerns about rash on torso and groin area and chest area. Patient feels this rash is coming from antiseptic used prior to surgery. Patient has questions about after care; pacemaker.  No chest pain. No shortness of breath. Please call

## 2018-01-08 ENCOUNTER — Ambulatory Visit: Payer: Medicare Other

## 2018-01-08 NOTE — Telephone Encounter (Signed)
Spoke with Ms. Arvilla MarketMills and let her know of Dr. Lubertha Basqueaylor's recommendations. She verbalizes understanding and is agreeable.

## 2018-01-09 ENCOUNTER — Ambulatory Visit: Payer: Medicare Other | Admitting: Internal Medicine

## 2018-01-10 ENCOUNTER — Telehealth: Payer: Self-pay | Admitting: Internal Medicine

## 2018-01-10 NOTE — Telephone Encounter (Signed)
New Message:      Pt calling due to have pail yellow discharge from her wound site.  Pt c/o BP issue: STAT if pt c/o blurred vision, one-sided weakness or slurred speech  1. What are your last 5 BP readings? 159/91 hr 59  2. Are you having any other symptoms (ex. Dizziness, headache, blurred vision, passed out)? Dizziness/nausea  3. What is your BP issue? Hr 59. Pt is having tingling and numbness in her feet.

## 2018-01-10 NOTE — Telephone Encounter (Signed)
Tracey Dudley is feeling "shaky" and nauseous and having tingling in her feet. She says that her skin is yellow below the PPM bandage. I explained that a normal progression of a healing bruise will be yellow and that it is likely from the PPM implant. Transmission sent to review PPM function- no episodes, normal device function. Her smile is symmetrical, equal grips, no slurred speech. She doesn't think that she can get to [place] because her husband isn't familiar with the drive. She admits to having a fair amount of anxiety related to her COPD and AFIB and says that this all might be because she's gotten herself worked up. She had me reiterate that her PPM was functioning normally several times. I advised that she could call her PCP regarding symptoms. She says that she will rest and if her symptoms continue then she will go to [facility].

## 2018-01-14 ENCOUNTER — Ambulatory Visit (INDEPENDENT_AMBULATORY_CARE_PROVIDER_SITE_OTHER): Payer: Medicare Other | Admitting: *Deleted

## 2018-01-14 DIAGNOSIS — I481 Persistent atrial fibrillation: Secondary | ICD-10-CM | POA: Diagnosis not present

## 2018-01-14 DIAGNOSIS — I4819 Other persistent atrial fibrillation: Secondary | ICD-10-CM

## 2018-01-14 NOTE — Progress Notes (Signed)
Wound check appointment. Steri-strips removed. Wound without redness or edema. Incision edges approximated, wound well healed. Normal device function. Thresholds, sensing, and impedances consistent with implant measurements. RV-HIS: LOC 0.75@1ms . Device programmed at 3.5V  until 3 month visit. Histogram distribution appropriate for patient and level of activity. No mode switches or high ventricular rates noted. Patient educated about wound care, arm mobility, lifting restrictions. ROV with GT 7/11 - RDS

## 2018-01-27 NOTE — Progress Notes (Signed)
Cardiology Office Note  Date: 01/28/2018   ID: Tracey Dudley, DOB October 03, 1945, MRN [id number]  PCP: Lenoria Chime, FNP  Primary Cardiologist: Nona Dell, MD   Chief Complaint  Patient presents with  . Atrial Fibrillation    History of Present Illness: Tracey Dudley is a 72 y.o. female last seen in April.  She had an interval visit with Dr. Ladona Ridgel and ultimately underwent incomplete AV node ablation with placement of Medtronic pacemaker with His bundle lead on April 18.  She presents today for follow-up, states that she feels much better in general, no palpitations at present.  She plans to start back in pulmonary rehabilitation later this month.  I reviewed her medications which are stable from a cardiac perspective.  We are not making any specific changes at this time.  She still has short acting Cardizem to use as needed.  Heart rate today is around 60.  Past Medical History:  Diagnosis Date  . Anxiety   . Atrial fibrillation (HCC)   . COPD (chronic obstructive pulmonary disease) (HCC)   . Essential hypertension   . Herpes zoster   . Hyperlipidemia   . PAF (paroxysmal atrial fibrillation) (HCC)    Followed by Dr. Leonie Green in [place], also [facility] EP assessment by Dr. Christin Fudge  . 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 Vitamin D deficiency     Past Surgical History:  Procedure Laterality Date  . ABDOMINAL HYSTERECTOMY    . ABLATION OF DYSRHYTHMIC FOCUS  01/02/2018  . AV NODE ABLATION N/A 01/02/2018   Procedure: AV NODE ABLATION;  Surgeon: Marinus Maw, MD;  [facility]AS[facility] CV LAB;  Service: Cardiovascular;  Laterality: N/A;  . CATARACT EXTRACTION    . INSERT / REPLACE / REMOVE PACEMAKER  01/02/2018   DUAL CHAMBER  . PACEMAKER IMPLANT N/A 01/02/2018   Procedure: PACEMAKER IMPLANT - Dual Chamber;  Surgeon: Marinus Maw, MD;  Location: [facility] INVASIVE CV LAB;  Service: Cardiovascular;  Laterality: N/A;    Current Outpatient  Medications  Medication Sig Dispense Refill  . acetaminophen (TYLENOL) 500 MG tablet Take 500 mg by mouth every 6 (six) hours as needed for moderate pain or headache.     . albuterol (PROVENTIL HFA;VENTOLIN HFA) 108 (90 BASE) MCG/ACT inhaler Inhale 1 puff into the lungs every 6 (six) hours as needed for wheezing or shortness of breath.    . ALPRAZolam (XANAX) 0.5 MG tablet Take 0.25-1 mg by mouth 3 (three) times daily as needed for anxiety.     . Artificial Tear Solution (SOOTHE XP) SOLN Place 1-2 drops into both eyes daily as needed (dry eyes).     Marland Kitchen atorvastatin (LIPITOR) 20 MG tablet Take 20 mg by mouth every evening.     . Azilsartan Medoxomil (EDARBI) 80 MG TABS TAKE 80 MG BY MOUTH EVERY DAY 90 tablet 3  . BYSTOLIC 20 MG TABS TAKE 1 TABLET BY MOUTH EVERY DAY (Patient taking differently: TAKE 20 MG BY MOUTH EVERY DAY) 30 tablet 6  . diltiazem (CARDIZEM) 30 MG tablet Take 1 tablet (30 mg total) by mouth 3 (three) times daily. PRN for AFIB (Patient taking differently: Take 30 mg by mouth 3 (three) times daily as needed (for AFIB). ) 270 tablet 3  . dofetilide (TIKOSYN) 250 MCG capsule TAKE ONE CAPSULE BY MOUTH EVERY 12 HOURS (Patient taking differently: TAKE 250 MCG BY MOUTH EVERY 12 HOURS) 60 capsule 10  . hydrALAZINE (APRESOLINE) 100 MG  tablet TAKE 100 MG BY MOUTH 3 TIMES A DAY 90 tablet 2  . OXYGEN Inhale 2 L into the lungs continuous.    . rivaroxaban (XARELTO) 20 MG TABS tablet Take 20 mg by mouth daily with supper.    Marland Kitchen spironolactone (ALDACTONE) 25 MG tablet TAKE 1/2 TABLET (12.5mg ) BY MOUTH EVERY DAY 45 tablet 6  . TRELEGY ELLIPTA 100-62.5-25 MCG/INH AEPB INHALE 1 PUFF BY MOUTH EVERY DAY  0   No current facility-administered medications for this visit.    Allergies:  Metoprolol and Erythromycin   Social History: The patient  reports that she quit smoking about 3 years ago. Her smoking use included cigarettes. She started smoking about 40 years ago. She has a 15.00 pack-year smoking  history. She has never used smokeless tobacco. She reports that she does not drink alcohol or use drugs.   ROS:  Please see the history of present illness. Otherwise, complete review of systems is positive for chronic dyspnea on exertion, uses supplemental oxygen.  All other systems are reviewed and negative.   Physical Exam: VS:  BP 138/66   Pulse 62   Ht  (1.676 m)   Wt 172 lb (78 kg)   SpO2 95%   BMI 27.76 kg/m , BMI Body mass index is 27.76 kg/m.  Wt Readings from Last 3 Encounters:  01/28/18 172 lb (78 kg)  01/03/18 172 lb 11.2 oz (78.3 kg)  12/25/17 174 lb (78.9 kg)    General: Patient appears comfortable at rest.  Wearing oxygen via nasal cannula. HEENT: Conjunctiva and lids normal, oropharynx clear. Neck: Supple, no elevated JVP or carotid bruits, no thyromegaly. Lungs: No wheezing, nonlabored breathing at rest. Cardiac: Regular rate and rhythm, no S3, soft systolic murmur. Abdomen: Soft, nontender,  bowel sounds present. Extremities: No pitting edema, distal pulses 2+. Skin: Warm and dry. Musculoskeletal: No kyphosis. Neuropsychiatric: Alert and oriented x3, affect grossly appropriate.  ECG: I personally reviewed the tracing from 01/03/2018 which showed sinus rhythm with PVC and PAC, right bundle branch block.  Recent Labwork: 04/02/2017: Magnesium 1.8 11/28/2017: ALT 13; AST 18 12/25/2017: BUN 19; Creatinine, Ser 1.26; Hemoglobin 11.7; Platelets 191; Potassium 4.7; Sodium 132   Other Studies Reviewed Today:  Echocardiogram 10/10/2015: Study Conclusions  - Left ventricle: The cavity size was normal. Wall thickness was increased in a pattern of mild LVH. Systolic function was vigorous. The estimated ejection fraction was in the range of 75% to 80%. Wall motion was normal; there were no regional wall motion abnormalities. Features are consistent with a pseudonormal left ventricular filling pattern, with concomitant abnormal relaxation and increased  filling pressure (grade 2 diastolic dysfunction). - Aortic valve: Poorly visualized. Mildly calcified annulus. Probably trileaflet. Mean gradient (S): 13 mm Hg. Peak gradient (S): 24 mm Hg. Gradients likely increased due to relatively small LVOT and vigorous contraction. Limited views of valve leaflets show grossly preserved excursion. - Mitral valve: Mildly thickened leaflets . There was mild regurgitation. - Left atrium: The atrium was at the upper limits of normal in size. - Right atrium: Central venous pressure (est): 3 mm Hg. - Tricuspid valve: There was trivial regurgitation. - Pulmonary arteries: Systolic pressure could not be accurately estimated. - Pericardium, extracardiac: A prominent pericardial fat pad was present.  Impressions:  - Mild LVH with LVEF 75-80% and grade 2 diastolic dysfunction with increased filling pressures. Normal left atrial chamber size. Mildly thickened mitral leaflets with mild mitral regurgitation. Aortic valve appears mildly sclerotic without definitive stenosis. Trivial tricuspid  regurgitation, PASP not estimated.  Assessment and Plan:  1.  Paroxysmal atrial fibrillation now status post incomplete AV node ablation with placement of Medtronic His bundle pacer by Dr. Ladona Ridgel.  We will continue her current cardiac medical regimen and have her follow-up with Dr. Ladona Ridgel as scheduled.  2.  Essential hypertension, no changes made to present regimen.  3.  COPD with chronic hypoxic respiratory failure, continues on oxygen with follow-up per PCP.  She plans to start back in pulmonary rehabilitation later this month.  4.  Mixed hyperlipidemia on Lipitor.  Current medicines were reviewed with the patient today.  Disposition: Follow-up in 6 months.  Signed, Jonelle Sidle, MD, [facility] 01/28/2018 2:06 PM    [facility] [address], [place], [place] [address] Phone: [phone];  Fax: [phone]

## 2018-01-28 ENCOUNTER — Ambulatory Visit (INDEPENDENT_AMBULATORY_CARE_PROVIDER_SITE_OTHER): Payer: Medicare Other | Admitting: Cardiology

## 2018-01-28 ENCOUNTER — Encounter: Payer: Self-pay | Admitting: Cardiology

## 2018-01-28 VITALS — BP 138/66 | HR 62 | Ht 66.0 in | Wt 172.0 lb

## 2018-01-28 DIAGNOSIS — J449 Chronic obstructive pulmonary disease, unspecified: Secondary | ICD-10-CM | POA: Diagnosis not present

## 2018-01-28 DIAGNOSIS — I10 Essential (primary) hypertension: Secondary | ICD-10-CM

## 2018-01-28 DIAGNOSIS — I480 Paroxysmal atrial fibrillation: Secondary | ICD-10-CM | POA: Diagnosis not present

## 2018-01-28 DIAGNOSIS — E782 Mixed hyperlipidemia: Secondary | ICD-10-CM

## 2018-01-28 NOTE — Patient Instructions (Signed)
Your physician wants you to follow-up in:  6 months with Dr McDowell You will receive a reminder letter in the mail two months in advance. If you don't receive a letter, please call our office to schedule the follow-up appointment.    Your physician recommends that you continue on your current medications as directed. Please refer to the Current Medication list given to you today.    If you need a refill on your cardiac medications before your next appointment, please call your pharmacy.      No testing or lab work ordered today.      Thank you for choosing [facility] !         

## 2018-02-17 ENCOUNTER — Telehealth: Payer: Self-pay | Admitting: Internal Medicine

## 2018-02-17 NOTE — Telephone Encounter (Signed)
New message    Patient needs written notice saying patient can resume pulmonary therapy   [facility] [phone] fax #

## 2018-02-18 ENCOUNTER — Encounter: Payer: Self-pay | Admitting: Internal Medicine

## 2018-02-18 NOTE — Telephone Encounter (Signed)
Letter faxed as requested

## 2018-02-26 ENCOUNTER — Encounter

## 2018-02-26 ENCOUNTER — Encounter: Payer: Medicare Other | Admitting: Internal Medicine

## 2018-03-03 ENCOUNTER — Other Ambulatory Visit: Payer: Self-pay | Admitting: *Deleted

## 2018-03-03 MED ORDER — AZILSARTAN MEDOXOMIL 80 MG PO TABS: ORAL_TABLET | ORAL | 3 refills | Status: DC

## 2018-03-18 ENCOUNTER — Telehealth: Payer: Self-pay | Admitting: Internal Medicine

## 2018-03-27 ENCOUNTER — Encounter: Payer: Medicare Other | Admitting: Internal Medicine

## 2018-03-27 ENCOUNTER — Other Ambulatory Visit: Payer: Self-pay | Admitting: Cardiology

## 2018-04-02 ENCOUNTER — Telehealth: Payer: Self-pay

## 2018-04-02 NOTE — Telephone Encounter (Signed)
Pt called because her heart rate is low and wanted someone to look at her transmission to see if anything is going on with her ppm. I let her talk to device tech nurse.

## 2018-04-08 ENCOUNTER — Encounter: Payer: Self-pay | Admitting: Internal Medicine

## 2018-04-08 ENCOUNTER — Ambulatory Visit: Payer: Medicare Other | Admitting: Internal Medicine

## 2018-04-08 VITALS — BP 168/72 | HR 67 | Ht 67.0 in | Wt 171.0 lb

## 2018-04-08 DIAGNOSIS — I480 Paroxysmal atrial fibrillation: Secondary | ICD-10-CM | POA: Diagnosis not present

## 2018-04-08 NOTE — Patient Instructions (Signed)
Medication Instructions:  Your physician recommends that you continue on your current medications as directed. Please refer to the Current Medication list given to you today.   Labwork: NONE   Testing/Procedures: NONE   Follow-Up: Your physician wants you to follow-up in: 9 Months with Dr. Ladona Ridgelaylor. You will receive a reminder letter in the mail two months in advance. If you don't receive a letter, please call our office to schedule the follow-up appointment.  Remote monitoring is used to monitor your Pacemaker of ICD from home. This monitoring reduces the number of office visits required to check your device to one time per year. It allows us to keep an eye on the functioning of your device to ensure it is working properly. You are scheduled for a device check from home on 09/07/18. You may send your transmission at any time that day. If you have a wireless device, the transmission will be sent automatically. After your physician reviews your transmission, you will receive a postcard with your next transmission date.     Any Other Special Instructions Will Be Listed Below (If Applicable).     If you need a refill on your cardiac medications before your next appointment, please call your pharmacy.  Thank you for choosing [facility]!

## 2018-04-08 NOTE — Telephone Encounter (Signed)
Late Entry: Spoke with patient and informed her that I had reviewed her transmission and that it was normal and that her heart rate was 68 at time of transmission pt voiced understanding

## 2018-04-08 NOTE — Progress Notes (Signed)
ekg 

## 2018-04-08 NOTE — Progress Notes (Signed)
HPI Tracey Dudley returns today for ongoing evaluation and management of her atrial fib. She underwent AV node ablation and PPM insertion after DCCV was unsuccessful at maintaining her in NSR. She has been stable. She has maintained NSR on dofetilide and she has multiple complaints today.  Allergies  Allergen Reactions  . Metoprolol Shortness Of Breath and Other (See Comments)    Tiredness  . Erythromycin Nausea Only     Current Outpatient Medications  Medication Sig Dispense Refill  . acetaminophen (TYLENOL) 500 MG tablet Take 500 mg by mouth every 6 (six) hours as needed for moderate pain or headache.     . albuterol (PROVENTIL HFA;VENTOLIN HFA) 108 (90 BASE) MCG/ACT inhaler Inhale 1 puff into the lungs every 6 (six) hours as needed for wheezing or shortness of breath.    . ALPRAZolam (XANAX) 0.5 MG tablet Take 0.25-1 mg by mouth 3 (three) times daily as needed for anxiety.     . Artificial Tear Solution (SOOTHE XP) SOLN Place 1-2 drops into both eyes daily as needed (dry eyes).     Marland Kitchen atorvastatin (LIPITOR) 20 MG tablet Take 20 mg by mouth every evening.     . Azilsartan Medoxomil (EDARBI) 80 MG TABS TAKE 80 MG BY MOUTH EVERY DAY 90 tablet 3  . BYSTOLIC 20 MG TABS TAKE 1 TABLET BY MOUTH EVERY DAY (Patient taking differently: TAKE 20 MG BY MOUTH EVERY DAY) 30 tablet 6  . diltiazem (CARDIZEM) 30 MG tablet Take 1 tablet (30 mg total) by mouth 3 (three) times daily. PRN for AFIB (Patient taking differently: Take 30 mg by mouth 3 (three) times daily as needed (for AFIB). ) 270 tablet 3  . dofetilide (TIKOSYN) 250 MCG capsule TAKE ONE CAPSULE BY MOUTH EVERY 12 HOURS (Patient taking differently: TAKE 250 MCG BY MOUTH EVERY 12 HOURS) 60 capsule 10  . hydrALAZINE (APRESOLINE) 100 MG tablet TAKE 1 TABLET BY MOUTH THREE TIMES A DAY 270 tablet 1  . OXYGEN Inhale 2 L into the lungs continuous.    . rivaroxaban (XARELTO) 20 MG TABS tablet Take 20 mg by mouth daily with supper.    Marland Kitchen spironolactone  (ALDACTONE) 25 MG tablet TAKE 1/2 TABLET (12.5mg ) BY MOUTH EVERY DAY 45 tablet 6  . TRELEGY ELLIPTA 100-62.5-25 MCG/INH AEPB INHALE 1 PUFF BY MOUTH EVERY DAY  0   No current facility-administered medications for this visit.      Past Medical History:  Diagnosis Date  . Anxiety   . Atrial fibrillation (HCC)   . COPD (chronic obstructive pulmonary disease) (HCC)   . Essential hypertension   . Herpes zoster   . Hyperlipidemia   . PAF (paroxysmal atrial fibrillation) (HCC)    Followed by Dr. Leonie Green in [place], also [facility] EP assessment by Dr. Christin Fudge  . 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 Vitamin D deficiency     ROS:   All systems reviewed and negative except as noted in the HPI.   Past Surgical History:  Procedure Laterality Date  . ABDOMINAL HYSTERECTOMY    . ABLATION OF DYSRHYTHMIC FOCUS  01/02/2018  . AV NODE ABLATION N/A 01/02/2018   Procedure: AV NODE ABLATION;  Surgeon: Marinus Maw, MD;  Location: [facility] INVAS[facility] CV LAB;  Service: Cardiovascular;  Laterality: N/A;  . CATARACT EXTRACTION    . INSERT / REPLACE / REMOVE PACEMAKER  01/02/2018   DUAL CHAMBER  . PACEMAKER IMPLANT N/A 01/02/2018   Procedure: PACEMAKER  IMPLANT - Dual Chamber;  Surgeon: Marinus Mawaylor, Gregg W, MD;  [facility];  Service: Cardiovascular;  Laterality: N/A;     Family History  Problem Relation Age of Onset  . Breast cancer Sister   . CAD Father   . Asthma Mother      Social History   Socioeconomic History  . Marital status: Married    Spouse name: Not on file  . Number of children: Not on file  . Years of education: Not on file  . Highest education level: Not on file  Occupational History  . Not on file  Social Needs  . Financial resource strain: Not on file  . Food insecurity:    Worry: Not on file    Inability: Not on file  . Transportation needs:    Medical: Not on file    Non-medical: Not on file  Tobacco Use  . Smoking status:  Former Smoker    Packs/day: 0.50    Years: 30.00    Pack years: 15.00    Types: Cigarettes    Start date: 11/03/1977    Last attempt to quit: 06/01/2014    Years since quitting: 3.8  . Smokeless tobacco: Never Used  Substance and Sexual Activity  . Alcohol use: No    Alcohol/week: 0.0 oz  . Drug use: No  . Sexual activity: Yes  Lifestyle  . Physical activity:    Days per week: Not on file    Minutes per session: Not on file  . Stress: Not on file  Relationships  . Social connections:    Talks on phone: Not on file    Gets together: Not on file    Attends religious service: Not on file    Active member of club or organization: Not on file    Attends meetings of clubs or organizations: Not on file    Relationship status: Not on file  . Intimate partner violence:    Fear of current or ex partner: Not on file    Emotionally abused: Not on file    Physically abused: Not on file    Forced sexual activity: Not on file  Other Topics Concern  . Not on file  Social History Narrative  . Not on file     BP (!) 168/72 (BP Location: Right Arm)   Pulse 67   Ht 5\' 7"  (1.702 m)   Wt 171 lb (77.6 kg)   SpO2 94%   BMI 26.78 kg/m   Physical Exam:  Well appearing 72 yo man, NAD HEENT: Unremarkable Neck:  6 cm JVD, no thyromegally Lymphatics:  No adenopathy Back:  No CVA tenderness Lungs:  Clear with no wheezes HEART:  Regular rate rhythm, no murmurs, no rubs, no clicks Abd:  soft, positive bowel sounds, no organomegally, no rebound, no guarding Ext:  2 plus pulses, no edema, no cyanosis, no clubbing Skin:  No rashes no nodules Neuro:  CN II through XII intact, motor grossly intact  EKG - NSR with atrial pacing and RBBB  DEVICE  Normal device function.  See PaceArt for details.   Assess/Plan: 1. PAF - she is maintaining NSR very nicely.  2. Heart block - her conduction is stable today. She did have recurrent AV conduction but is doing well. 3. PPM - her medtronic DDD PM  is working normally. We will recheck in several months.

## 2018-06-06 ENCOUNTER — Telehealth: Payer: Self-pay | Admitting: Cardiology

## 2018-06-06 NOTE — Telephone Encounter (Signed)
Patient called and stated that she wanted to check her PPM to see if she is having any abnormalities with her heart rate. She stated that she was at rehab and the reading was in the 60's but when she checked it at home it was in the 90's. She stated that she knows that BP cuffs can be inaccurate but she wanted to double check any way. Pt is calling to tech support to trouble shoot her monitor.

## 2018-06-06 NOTE — Telephone Encounter (Signed)
Remote transmission reviewed. Presenting rhythm: AF, Vs 90's. AF persistent since 9/16. No ventricular high rates recorded.   Called to inform patient that she is currently in AF. Patient states that she's had some GI upset, but denies any sx's r/t the AF. I told patient that she can take per PRN diltiazem as Rx'd and to call back if she develops any new or worsening sx's r/t AF. Patient verbalized understanding and appreciation of information.

## 2018-06-13 ENCOUNTER — Encounter (INDEPENDENT_AMBULATORY_CARE_PROVIDER_SITE_OTHER): Payer: Self-pay | Admitting: *Deleted

## 2018-06-23 ENCOUNTER — Other Ambulatory Visit: Payer: Self-pay | Admitting: Cardiology

## 2018-06-24 ENCOUNTER — Telehealth: Payer: Self-pay | Admitting: Cardiology

## 2018-06-24 NOTE — Telephone Encounter (Signed)
Recommended Mucinex to help with sinus drainage and to call her pcp who called in antibiotic for her

## 2018-06-24 NOTE — Telephone Encounter (Signed)
Pt would like to know if theres anything OTC that she could take for nausea that won't conflict w/ her meds. She's had some sinus drainage and it's making her nauseated

## 2018-07-01 ENCOUNTER — Ambulatory Visit (INDEPENDENT_AMBULATORY_CARE_PROVIDER_SITE_OTHER): Payer: Self-pay | Admitting: Internal Medicine

## 2018-07-01 ENCOUNTER — Ambulatory Visit (INDEPENDENT_AMBULATORY_CARE_PROVIDER_SITE_OTHER): Payer: Medicare Other | Admitting: Internal Medicine

## 2018-07-01 ENCOUNTER — Ambulatory Visit (INDEPENDENT_AMBULATORY_CARE_PROVIDER_SITE_OTHER): Payer: Medicare Other

## 2018-07-01 DIAGNOSIS — Z1211 Encounter for screening for malignant neoplasm of colon: Secondary | ICD-10-CM

## 2018-07-01 NOTE — Progress Notes (Signed)
Referring MD/PCP: Rehman   Procedure: TCS  Reason/Indication:  Screening  Has patient had this procedure before?  Yes  If so, when, by whom and where?  10 years ago , Dr.Spainhour with Propofol  Is there a family history of colon cancer? No  Who?  What age when diagnosed?  N/A  Is patient diabetic?   No      Does patient have prosthetic heart valve or mechanical valve?  No  Do you have a pacemaker?  Yes,  April 2019. Non Cardiovascular A-Fib  Has patient ever had endocarditis? No  Has patient had joint replacement within last 12 months?  No  Is patient constipated or do they take laxatives? No  Does patient have a history of alcohol/drug use?  No  Is patient on blood thinner such as Coumadin, Plavix and/or Aspirin? Xarelto  Medications: Bystolic 20 mg QD, Hydralazine 100 mg TID , Edarbi 80 mg QD,Tikosyn Caps 250 mcg 2 po QD, Xarelto 20 mg QD , Spironolactone 25 mg 1/2 QD , Diltiazem 30 mg 3 po for A-Fib , Atorvastatin 20 mg QD , Alprazolam 0.5 mg 3 po prn , Trelegy Ellipta 100 mcg QD , ProAir 90 mcg PRN , Home Oxygen 2 LPM 24 hours  Pharmacy - [facility] - [place] , [place] [phone]  Allergies: Erythomycin  - nausea  Medication Adjustment per Dr Keane Police, NP:  Procedure date & time:

## 2018-07-08 ENCOUNTER — Ambulatory Visit (INDEPENDENT_AMBULATORY_CARE_PROVIDER_SITE_OTHER): Payer: Medicare Other | Admitting: *Deleted

## 2018-07-08 ENCOUNTER — Encounter (INDEPENDENT_AMBULATORY_CARE_PROVIDER_SITE_OTHER): Payer: Self-pay | Admitting: Internal Medicine

## 2018-07-08 ENCOUNTER — Telehealth: Payer: Self-pay | Admitting: Cardiology

## 2018-07-08 DIAGNOSIS — I4891 Unspecified atrial fibrillation: Secondary | ICD-10-CM | POA: Diagnosis not present

## 2018-07-08 DIAGNOSIS — I10 Essential (primary) hypertension: Secondary | ICD-10-CM

## 2018-07-08 NOTE — Patient Instructions (Addendum)
Please see triage sheet once scheduled the patient will receive her instructions

## 2018-07-08 NOTE — Telephone Encounter (Signed)
Spoke with pt and reminded pt of remote transmission that is due today. Pt verbalized understanding.   

## 2018-07-09 ENCOUNTER — Encounter: Payer: Self-pay | Admitting: Cardiology

## 2018-07-16 ENCOUNTER — Ambulatory Visit (INDEPENDENT_AMBULATORY_CARE_PROVIDER_SITE_OTHER): Payer: Medicare Other

## 2018-07-16 ENCOUNTER — Encounter: Payer: Self-pay | Admitting: Orthopaedic Surgery

## 2018-07-16 ENCOUNTER — Ambulatory Visit: Payer: Medicare Other | Admitting: Orthopaedic Surgery

## 2018-07-16 VITALS — BP 146/74 | HR 60 | Ht 66.0 in | Wt 172.0 lb

## 2018-07-16 DIAGNOSIS — I4819 Other persistent atrial fibrillation: Secondary | ICD-10-CM | POA: Diagnosis not present

## 2018-07-16 DIAGNOSIS — G8929 Other chronic pain: Secondary | ICD-10-CM

## 2018-07-16 DIAGNOSIS — Z9981 Dependence on supplemental oxygen: Secondary | ICD-10-CM | POA: Diagnosis not present

## 2018-07-16 DIAGNOSIS — M25562 Pain in left knee: Secondary | ICD-10-CM | POA: Diagnosis not present

## 2018-07-16 NOTE — Progress Notes (Signed)
Subjective:    Patient ID: Tracey Dudley, female    DOB: 23-Aug-1946, 72 y.o.   MRN: [id number]  HPI She has pain of the left knee that is getting worse over the last six weeks to two months.  She has popping and swelling and more pain medially.  She has no giving way but feels it might.  She has no redness, no numbness.  She tired some heat but her skin broke out.  She saw her dermatologist for this earlier in the week.  She has no trauma.  She cannot take NSAIDs.   Review of Systems  Constitutional: Positive for activity change.  Respiratory: Positive for shortness of breath (oxygen dependent).   Cardiovascular: Positive for palpitations.  Musculoskeletal: Positive for arthralgias, gait problem and joint swelling.  Psychiatric/Behavioral: The patient is nervous/anxious.   All other systems reviewed and are negative.  For Review of Systems, all other systems reviewed and are negative.  The following is a summary of the past history medically, past history surgically, known current medicines, social history and family history.  This information is gathered electronically by the computer from prior information and documentation.  I review this each visit and have found including this information at this point in the chart is beneficial and informative.   Past Medical History:  Diagnosis Date  . Anxiety   . Atrial fibrillation (HCC)   . COPD (chronic obstructive pulmonary disease) (HCC)   . Essential hypertension   . Herpes zoster   . Hyperlipidemia   . PAF (paroxysmal atrial fibrillation) (HCC)    Followed by Dr. Leonie Green in [place], also [facility] EP assessment by Dr. Christin Fudge  . 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 Vitamin D deficiency     Past Surgical History:  Procedure Laterality Date  . ABDOMINAL HYSTERECTOMY    . ABLATION OF DYSRHYTHMIC FOCUS  01/02/2018  . AV NODE ABLATION N/A 01/02/2018   Procedure: AV NODE ABLATION;  Surgeon: Marinus Maw,  MD;  Location: [facility] INVASIVE CV LAB;  Service: Cardiovascular;  Laterality: N/A;  . CATARACT EXTRACTION    . INSERT / REPLACE / REMOVE PACEMAKER  01/02/2018   DUAL CHAMBER  . PACEMAKER IMPLANT N/A 01/02/2018   Procedure: PACEMAKER IMPLANT - Dual Chamber;  Surgeon: Marinus Maw, MD;  Location: [facility] INVASIVE CV LAB;  Service: Cardiovascular;  Laterality: N/A;    Current Outpatient Medications on File Prior to Visit  Medication Sig Dispense Refill  . acetaminophen (TYLENOL) 500 MG tablet Take 500 mg by mouth every 6 (six) hours as needed for moderate pain or headache.     . albuterol (PROVENTIL HFA;VENTOLIN HFA) 108 (90 BASE) MCG/ACT inhaler Inhale 1 puff into the lungs every 6 (six) hours as needed for wheezing or shortness of breath.    . ALPRAZolam (XANAX) 0.5 MG tablet Take 0.25-1 mg by mouth 3 (three) times daily as needed for anxiety.     . Artificial Tear Solution (SOOTHE XP) SOLN Place 1-2 drops into both eyes daily as needed (dry eyes).     Marland Kitchen atorvastatin (LIPITOR) 20 MG tablet Take 20 mg by mouth every evening.     . Azilsartan Medoxomil (EDARBI) 80 MG TABS TAKE 80 MG BY MOUTH EVERY DAY 90 tablet 3  . BYSTOLIC 20 MG TABS TAKE 1 TABLET BY MOUTH EVERY DAY (Patient taking differently: TAKE 20 MG BY MOUTH EVERY DAY) 30 tablet 6  . diltiazem (CARDIZEM) 30 MG tablet Take 1  tablet (30 mg total) by mouth 3 (three) times daily. PRN for AFIB (Patient taking differently: Take 30 mg by mouth 3 (three) times daily as needed (for AFIB). ) 270 tablet 3  . dofetilide (TIKOSYN) 250 MCG capsule TAKE ONE CAPSULE BY MOUTH EVERY 12 HOURS (Patient taking differently: TAKE 250 MCG BY MOUTH EVERY 12 HOURS) 60 capsule 10  . hydrALAZINE (APRESOLINE) 100 MG tablet TAKE 1 TABLET BY MOUTH THREE TIMES A DAY 270 tablet 1  . OXYGEN Inhale 2 L into the lungs continuous.    . rivaroxaban (XARELTO) 20 MG TABS tablet Take 20 mg by mouth daily with supper.    Marland Kitchen spironolactone (ALDACTONE) 25 MG tablet TAKE 1/2 TABLET (12.5MG )  BY MOUTH EVERY DAY 135 tablet 2  . TRELEGY ELLIPTA 100-62.5-25 MCG/INH AEPB INHALE 1 PUFF BY MOUTH EVERY DAY  0   No current facility-administered medications on file prior to visit.     Social History   Socioeconomic History  . Marital status: Married    Spouse name: Not on file  . Number of children: Not on file  . Years of education: Not on file  . Highest education level: Not on file  Occupational History  . Not on file  Social Needs  . Financial resource strain: Not on file  . Food insecurity:    Worry: Not on file    Inability: Not on file  . Transportation needs:    Medical: Not on file    Non-medical: Not on file  Tobacco Use  . Smoking status: Former Smoker    Packs/day: 0.50    Years: 30.00    Pack years: 15.00    Types: Cigarettes    Start date: 11/03/1977    Last attempt to quit: 06/01/2014    Years since quitting: 4.1  . Smokeless tobacco: Never Used  Substance and Sexual Activity  . Alcohol use: No    Alcohol/week: 0.0 standard drinks  . Drug use: No  . Sexual activity: Yes  Lifestyle  . Physical activity:    Days per week: Not on file    Minutes per session: Not on file  . Stress: Not on file  Relationships  . Social connections:    Talks on phone: Not on file    Gets together: Not on file    Attends religious service: Not on file    Active member of club or organization: Not on file    Attends meetings of clubs or organizations: Not on file    Relationship status: Not on file  . Intimate partner violence:    Fear of current or ex partner: Not on file    Emotionally abused: Not on file    Physically abused: Not on file    Forced sexual activity: Not on file  Other Topics Concern  . Not on file  Social History Narrative  . Not on file    Family History  Problem Relation Age of Onset  . Breast cancer Sister   . CAD Father   . Asthma Mother     BP (!) 146/74   Pulse 60   Ht 5\' 6"  (1.676 m)   Wt 172 lb (78 kg)   BMI 27.76 kg/m    Body mass index is 27.76 kg/m.     Objective:   Physical Exam  Constitutional: She is oriented to person, place, and time. She appears well-developed and well-nourished.  She is on supplemental oxygen.  HENT:  Head: Normocephalic and atraumatic.  Eyes: Pupils are equal, round, and reactive to light. Conjunctivae and EOM are normal.  Neck: Normal range of motion. Neck supple.  Cardiovascular: Normal rate, regular rhythm and intact distal pulses.  Pulmonary/Chest: Effort normal.  Abdominal: Soft.  Musculoskeletal:       Left knee: She exhibits decreased range of motion and swelling. Tenderness found. Medial joint line tenderness noted.       Legs: Neurological: She is alert and oriented to person, place, and time. She has normal reflexes. She displays normal reflexes. No cranial nerve deficit. She exhibits normal muscle tone. Coordination normal.  Skin: Skin is warm and dry.  Psychiatric: She has a normal mood and affect. Her behavior is normal. Judgment and thought content normal.  Vitals reviewed.   X-rays were done of the left knee, reported separately.  DJD present.      Assessment & Plan:   Encounter Diagnoses  Name Primary?  . Chronic pain of left knee Yes  . Persistent atrial fibrillation   . Dependence on continuous supplemental oxygen    She cannot take NSAIDs.  PROCEDURE NOTE:  The patient requests injections of the left knee , verbal consent was obtained.  The left knee was prepped appropriately after time out was performed.   Sterile technique was observed and injection of 1 cc of Depo-Medrol 40 mg with several cc's of plain xylocaine. Anesthesia was provided by ethyl chloride and a 20-gauge needle was used to inject the knee area. The injection was tolerated well.  A band aid dressing was applied.  The patient was advised to apply ice later today and tomorrow to the injection sight as needed.  I will see her in two weeks. Consider MRI if not  improved.  Call if any problem.  Precautions discussed.   Electronically Signed Darreld Mclean, MD 10/30/20193:39 PM

## 2018-07-17 NOTE — Progress Notes (Signed)
Remote pacemaker transmission.   

## 2018-07-22 ENCOUNTER — Other Ambulatory Visit: Payer: Self-pay | Admitting: Cardiology

## 2018-07-28 NOTE — Progress Notes (Signed)
Cardiology Office Note  Date: 07/29/2018   ID: Tracey Dudley, DOB 1946-06-22, MRN [id number]  PCP: Jonathon Bellows, DO  Primary Cardiologist: Nona Dell, MD   Chief Complaint  Patient presents with  . Atrial Fibrillation    History of Present Illness: Tracey Dudley is a 72 y.o. female last seen in May.  She is here today for a routine visit.  From a cardiac perspective, she does not report any palpitations or chest pain.  She follows with Dr. Ladona Ridgel with a history of incomplete AV node ablation with placement of Medtronic pacemaker and His bundle lead.  She was last seen in July.  Reviewed the ECG.  She remains on stable cardiac regimen including Xarelto, Aldactone, hydralazine, Tikosyn, Bystolic, and as needed short acting Cardizem which she has not used recently.  She states that she had full set of lab work with her PCP which we are requesting for review.  She continues on supplemental oxygen and participating in pulmonary rehab.  She does report recent URI symptoms and cough as well as chest congestion.  She plans to go to her local urgent care today associated with PCP for further assessment.  Past Medical History:  Diagnosis Date  . Anxiety   . Atrial fibrillation (HCC)   . COPD (chronic obstructive pulmonary disease) (HCC)   . Essential hypertension   . Herpes zoster   . Hyperlipidemia   . PAF (paroxysmal atrial fibrillation) (HCC)    Followed by Dr. Leonie Dudley in [place], also [facility] EP assessment by Dr. Christin Dudley  . 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 Vitamin D deficiency     Past Surgical History:  Procedure Laterality Date  . ABDOMINAL HYSTERECTOMY    . ABLATION OF DYSRHYTHMIC FOCUS  01/02/2018  . AV NODE ABLATION N/A 01/02/2018   Procedure: AV NODE ABLATION;  Surgeon: Marinus Maw, MD;  Location: [facility] INVASIVE CV LAB;  Service: Cardiovascular;  Laterality: N/A;  . CATARACT EXTRACTION    . INSERT / REPLACE / REMOVE PACEMAKER   01/02/2018   DUAL CHAMBER  . PACEMAKER IMPLANT N/A 01/02/2018   Procedure: PACEMAKER IMPLANT - Dual Chamber;  Surgeon: Marinus Maw, MD;  Location: [facility] INVASIVE CV LAB;  Service: Cardiovascular;  Laterality: N/A;    Current Outpatient Medications  Medication Sig Dispense Refill  . acetaminophen (TYLENOL) 500 MG tablet Take 500 mg by mouth every 6 (six) hours as needed for moderate pain or headache.     . albuterol (PROVENTIL HFA;VENTOLIN HFA) 108 (90 BASE) MCG/ACT inhaler Inhale 1 puff into the lungs every 6 (six) hours as needed for wheezing or shortness of breath.    . ALPRAZolam (XANAX) 0.5 MG tablet Take 0.25-1 mg by mouth 3 (three) times daily as needed for anxiety.     . Artificial Tear Solution (SOOTHE XP) SOLN Place 1-2 drops into both eyes daily as needed (dry eyes).     Marland Kitchen atorvastatin (LIPITOR) 20 MG tablet Take 20 mg by mouth every evening.     . Azilsartan Medoxomil (EDARBI) 80 MG TABS TAKE 80 MG BY MOUTH EVERY DAY 90 tablet 3  . BYSTOLIC 20 MG TABS TAKE 1 TABLET BY MOUTH EVERY DAY (Patient taking differently: TAKE 20 MG BY MOUTH EVERY DAY) 30 tablet 6  . diltiazem (CARDIZEM) 30 MG tablet Take 1 tablet (30 mg total) by mouth 3 (three) times daily. PRN for AFIB (Patient taking differently: Take 30 mg by mouth 3 (three) times  daily as needed (for AFIB). ) 270 tablet 3  . dofetilide (TIKOSYN) 250 MCG capsule TAKE ONE CAPSULE BY MOUTH EVERY 12 HOURS (Patient taking differently: TAKE 250 MCG BY MOUTH EVERY 12 HOURS) 60 capsule 10  . hydrALAZINE (APRESOLINE) 100 MG tablet TAKE 1 TABLET BY MOUTH THREE TIMES A DAY 270 tablet 1  . OXYGEN Inhale 2 L into the lungs continuous.    Marland Kitchen spironolactone (ALDACTONE) 25 MG tablet TAKE 1/2 TABLET (12.5MG ) BY MOUTH EVERY DAY 135 tablet 2  . TRELEGY ELLIPTA 100-62.5-25 MCG/INH AEPB INHALE 1 PUFF BY MOUTH EVERY DAY  0  . XARELTO 20 MG TABS tablet TAKE 1 TABLET BY MOUTH WITH FOOD EVERY DAY 90 tablet 2   No current facility-administered medications for  this visit.    Allergies:  Metoprolol and Erythromycin   Social History: The patient  reports that she quit smoking about 4 years ago. Her smoking use included cigarettes. She started smoking about 40 years ago. She has a 15.00 pack-year smoking history. She has never used smokeless tobacco. She reports that she does not drink alcohol or use drugs.   ROS:  Please see the history of present illness. Otherwise, complete review of systems is positive for recent URI symptoms.  All other systems are reviewed and negative.   Physical Exam: VS:  BP (!) 144/72 (BP Location: Right Arm)   Pulse 77   Ht 5' 6.5" (1.689 m)   Wt 171 lb (77.6 kg)   SpO2 93%   BMI 27.19 kg/m , BMI Body mass index is 27.19 kg/m.  Wt Readings from Last 3 Encounters:  07/29/18 171 lb (77.6 kg)  07/16/18 172 lb (78 kg)  04/08/18 171 lb (77.6 kg)    General: Patient appears comfortable at rest.  Wearing supplemental oxygen via nasal cannula. HEENT: Conjunctiva and lids normal, oropharynx clear. Neck: Supple, no elevated JVP or carotid bruits, no thyromegaly. Lungs: Decreased breath sounds with scattered rhonchi, nonlabored breathing at rest. Cardiac: Regular rate and rhythm, no S3, soft systolic murmur. Abdomen: Soft, nontender, bowel sounds present. Extremities: No pitting edema, distal pulses 2+. Skin: Warm and dry. Musculoskeletal: No kyphosis. Neuropsychiatric: Alert and oriented x3, affect grossly appropriate.  ECG: I personally reviewed the tracing from 04/08/2018 which showed an atrial paced rhythm with right bundle branch block.  Recent Labwork: 11/28/2017: ALT 13; AST 18 12/25/2017: BUN 19; Creatinine, Ser 1.26; Hemoglobin 11.7; Platelets 191; Potassium 4.7; Sodium 132   Other Studies Reviewed Today:  Echocardiogram 10/10/2015: Study Conclusions  - Left ventricle: The cavity size was normal. Wall thickness was   increased in a pattern of mild LVH. Systolic function was   vigorous. The estimated  ejection fraction was in the range of 75%   to 80%. Wall motion was normal; there were no regional wall   motion abnormalities. Features are consistent with a pseudonormal   left ventricular filling pattern, with concomitant abnormal   relaxation and increased filling pressure (grade 2 diastolic   dysfunction). - Aortic valve: Poorly visualized. Mildly calcified annulus.   Probably trileaflet. Mean gradient (S): 13 mm Hg. Peak gradient   (S): 24 mm Hg. Gradients likely increased due to relatively small   LVOT and vigorous contraction. Limited views of valve leaflets   show grossly preserved excursion. - Mitral valve: Mildly thickened leaflets . There was mild   regurgitation. - Left atrium: The atrium was at the upper limits of normal in   size. - Right atrium: Central venous pressure (est): 3  mm Hg. - Tricuspid valve: There was trivial regurgitation. - Pulmonary arteries: Systolic pressure could not be accurately   estimated. - Pericardium, extracardiac: A prominent pericardial fat pad was   present.  Impressions:  - Mild LVH with LVEF 75-80% and grade 2 diastolic dysfunction with   increased filling pressures. Normal left atrial chamber size.   Mildly thickened mitral leaflets with mild mitral regurgitation.   Aortic valve appears mildly sclerotic without definitive   stenosis. Trivial tricuspid regurgitation, PASP not estimated.  Assessment and Plan:  1.  Paroxysmal atrial fibrillation, status post incomplete AV node ablation and placement of Medtronic His bundle pacer with follow-up per Dr. Ladona Ridgel.  She is doing well in terms of symptom control on current regimen and also remains on Xarelto for stroke prophylaxis.  Requesting most recent lab work from PCP.  2.  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  She follows with PCP and continues to participate in pulmonary rehabilitation.  She is reporting recent onset URI symptoms and plans to follow-up  with her local urgent care associated with PCP today.  3.  Mixed hyperlipidemia on Lipitor.  No obvious intolerances.  4.  Essential hypertension, blood pressure control is reasonable today.  No changes made.  Current medicines were reviewed with the patient today.  Disposition: Follow-up in 6 months.  Signed, Jonelle Sidle, MD, [facility] 07/29/2018 11:54 AM    [facility] [address], [place], [place] [address] Phone: [phone]; Fax: [phone]

## 2018-07-29 ENCOUNTER — Ambulatory Visit: Payer: Medicare Other | Admitting: Cardiology

## 2018-07-29 ENCOUNTER — Encounter: Payer: Self-pay | Admitting: Cardiology

## 2018-07-29 VITALS — BP 144/72 | HR 77 | Ht 66.5 in | Wt 171.0 lb

## 2018-07-29 DIAGNOSIS — E782 Mixed hyperlipidemia: Secondary | ICD-10-CM

## 2018-07-29 DIAGNOSIS — I4819 Other persistent atrial fibrillation: Secondary | ICD-10-CM | POA: Diagnosis not present

## 2018-07-29 DIAGNOSIS — I10 Essential (primary) hypertension: Secondary | ICD-10-CM | POA: Diagnosis not present

## 2018-07-29 DIAGNOSIS — J449 Chronic obstructive pulmonary disease, unspecified: Secondary | ICD-10-CM

## 2018-07-29 NOTE — Patient Instructions (Addendum)
Medication Instructions:  Your physician recommends that you continue on your current medications as directed. Please refer to the Current Medication list given to you today.  If you need a refill on your cardiac medications before your next appointment, please call your pharmacy.   Lab work: None If you have labs (blood work) drawn today and your tests are completely normal, you will receive your results only by: . MyChart Message (if you have MyChart) OR . A paper copy in the mail If you have any lab test that is abnormal or we need to change your treatment, we will call you to review the results.  Testing/Procedures: None  Follow-Up: At [facility], you and your health needs are our priority.  As part of our continuing mission to provide you with exceptional heart care, we have created designated Provider Care Teams.  These Care Teams include your primary Cardiologist (physician) and Advanced Practice Providers (APPs -  Physician Assistants and Nurse Practitioners) who all work together to provide you with the care you need, when you need it. You will need a follow up appointment in 6 months.  Please call our office 2 months in advance to schedule this appointment.  You may see Samuel McDowell, MD or one of the following Advanced Practice Providers on your designated Care Team:   Brittany Strader, PA-C ([place] Office) . Michele Lenze, PA-C ( Office)  Any Other Special Instructions Will Be Listed Below (If Applicable). None   

## 2018-07-30 ENCOUNTER — Ambulatory Visit: Payer: Medicare Other | Admitting: Orthopaedic Surgery

## 2018-07-30 ENCOUNTER — Encounter: Payer: Self-pay | Admitting: Orthopaedic Surgery

## 2018-07-30 VITALS — BP 139/67 | HR 74 | Ht 66.5 in | Wt 174.0 lb

## 2018-07-30 DIAGNOSIS — Z9981 Dependence on supplemental oxygen: Secondary | ICD-10-CM

## 2018-07-30 DIAGNOSIS — I4819 Other persistent atrial fibrillation: Secondary | ICD-10-CM | POA: Diagnosis not present

## 2018-07-30 DIAGNOSIS — G8929 Other chronic pain: Secondary | ICD-10-CM

## 2018-07-30 DIAGNOSIS — M25562 Pain in left knee: Secondary | ICD-10-CM

## 2018-07-30 NOTE — Progress Notes (Signed)
Patient Tracey Dudley, female DOB:1946/01/03, 72 y.o. MVH:[id number]  Chief Complaint  Patient presents with  . Knee Pain    left    HPI  Nicole KindredLinda E Camille is a 72 y.o. female who has left knee pain.  It is better after the injection.  She is on Prednisone by mouth for her lung problem.  She is on supplemental oxygen.  She had a round of bronchitis this past week.  She has much less swelling and less popping.  She is walking better but still slow and still with a limp to the left.  She has no giving way, no new trauma.   Body mass index is 27.66 kg/m.  ROS  Review of Systems  Constitutional: Positive for activity change.  Respiratory: Positive for shortness of breath (oxygen dependent).   Cardiovascular: Positive for palpitations.  Musculoskeletal: Positive for arthralgias, gait problem and joint swelling.  Psychiatric/Behavioral: The patient is nervous/anxious.   All other systems reviewed and are negative.   All other systems reviewed and are negative.  The following is a summary of the past history medically, past history surgically, known current medicines, social history and family history.  This information is gathered electronically by the computer from prior information and documentation.  I review this each visit and have found including this information at this point in the chart is beneficial and informative.    Past Medical History:  Diagnosis Date  . Anxiety   . Atrial fibrillation (HCC)   . COPD (chronic obstructive pulmonary disease) (HCC)   . Essential hypertension   . Herpes zoster   . Hyperlipidemia   . PAF (paroxysmal atrial fibrillation) (HCC)    Followed by Dr. Leonie GreenLingle in [place], also [facility] EP assessment by Dr. Christin FudgeHegland  . 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 Vitamin D deficiency     Past Surgical History:  Procedure Laterality Date  . ABDOMINAL HYSTERECTOMY    . ABLATION OF DYSRHYTHMIC FOCUS  01/02/2018  . AV NODE  ABLATION N/A 01/02/2018   Procedure: AV NODE ABLATION;  Surgeon: Marinus Mawaylor, Gregg W, MD;  [facility] CV LAB;  Service: Cardiovascular;  Laterality: N/A;  . CATARACT EXTRACTION    . INSERT / REPLACE / REMOVE PACEMAKER  01/02/2018   DUAL CHAMBER  . PACEMAKER IMPLANT N/A 01/02/2018   Procedure: PACEMAKER IMPLANT - Dual Chamber;  Surgeon: Marinus Mawaylor, Gregg W, MD;  Location: [facility] INVASIVE CV LAB;  Service: Cardiovascular;  Laterality: N/A;    Family History  Problem Relation Age of Onset  . Breast cancer Sister   . CAD Father   . Asthma Mother     Social History Social History   Tobacco Use  . Smoking status: Former Smoker    Packs/day: 0.50    Years: 30.00    Pack years: 15.00    Types: Cigarettes    Start date: 11/03/1977    Last attempt to quit: 06/01/2014    Years since quitting: 4.1  . Smokeless tobacco: Never Used  Substance Use Topics  . Alcohol use: No    Alcohol/week: 0.0 standard drinks  . Drug use: No    Allergies  Allergen Reactions  . Metoprolol Shortness Of Breath and Other (See Comments)    Tiredness  . Erythromycin Nausea Only    Current Outpatient Medications  Medication Sig Dispense Refill  . acetaminophen (TYLENOL) 500 MG tablet Take 500 mg by mouth every 6 (six) hours as needed for moderate pain or  headache.     . albuterol (PROVENTIL HFA;VENTOLIN HFA) 108 (90 BASE) MCG/ACT inhaler Inhale 1 puff into the lungs every 6 (six) hours as needed for wheezing or shortness of breath.    . ALPRAZolam (XANAX) 0.5 MG tablet Take 0.25-1 mg by mouth 3 (three) times daily as needed for anxiety.     . Artificial Tear Solution (SOOTHE XP) SOLN Place 1-2 drops into both eyes daily as needed (dry eyes).     Marland Kitchen atorvastatin (LIPITOR) 20 MG tablet Take 20 mg by mouth every evening.     . Azilsartan Medoxomil (EDARBI) 80 MG TABS TAKE 80 MG BY MOUTH EVERY DAY 90 tablet 3  . BYSTOLIC 20 MG TABS TAKE 1 TABLET BY MOUTH EVERY DAY (Patient taking differently: TAKE 20 MG BY MOUTH  EVERY DAY) 30 tablet 6  . diltiazem (CARDIZEM) 30 MG tablet Take 1 tablet (30 mg total) by mouth 3 (three) times daily. PRN for AFIB (Patient taking differently: Take 30 mg by mouth 3 (three) times daily as needed (for AFIB). ) 270 tablet 3  . dofetilide (TIKOSYN) 250 MCG capsule TAKE ONE CAPSULE BY MOUTH EVERY 12 HOURS (Patient taking differently: TAKE 250 MCG BY MOUTH EVERY 12 HOURS) 60 capsule 10  . hydrALAZINE (APRESOLINE) 100 MG tablet TAKE 1 TABLET BY MOUTH THREE TIMES A DAY 270 tablet 1  . OXYGEN Inhale 2 L into the lungs continuous.    Marland Kitchen spironolactone (ALDACTONE) 25 MG tablet TAKE 1/2 TABLET (12.5MG ) BY MOUTH EVERY DAY 135 tablet 2  . TRELEGY ELLIPTA 100-62.5-25 MCG/INH AEPB INHALE 1 PUFF BY MOUTH EVERY DAY  0  . XARELTO 20 MG TABS tablet TAKE 1 TABLET BY MOUTH WITH FOOD EVERY DAY 90 tablet 2   No current facility-administered medications for this visit.      Physical Exam  Blood pressure 139/67, pulse 74, height 5' 6.5" (1.689 m), weight 174 lb (78.9 kg).  Constitutional: overall normal hygiene, normal nutrition, well developed, normal grooming, normal body habitus. Assistive device:supplemental oxygen  Musculoskeletal: gait and station Limp left, muscle tone and strength are normal, no tremors or atrophy is present.  .  Neurological: coordination overall normal.  Deep tendon reflex/nerve stretch intact.  Sensation normal.  Cranial nerves II-XII intact.   Skin:   Normal overall no scars, lesions, ulcers or rashes. No psoriasis.  Psychiatric: Alert and oriented x 3.  Recent memory intact, remote memory unclear.  Normal mood and affect. Well groomed.  Good eye contact.  Cardiovascular: overall no swelling, no varicosities, no edema bilaterally, normal temperatures of the legs and arms, no clubbing, cyanosis and good capillary refill.  Lymphatic: palpation is normal.  Left knee has slight effusion, crepitus, ROM 0 to 110, limp to the left, more medial pain.  NV intact.  All  other systems reviewed and are negative   The patient has been educated about the nature of the problem(s) and counseled on treatment options.  The patient appeared to understand what I have discussed and is in agreement with it.  Encounter Diagnoses  Name Primary?  . Chronic pain of left knee Yes  . Persistent atrial fibrillation   . Dependence on continuous supplemental oxygen     PLAN Call if any problems.  Precautions discussed.  Continue current medications.   Return to clinic 1 month   Electronically Signed Darreld Mclean, MD 11/13/20192:25 PM

## 2018-08-26 ENCOUNTER — Encounter: Payer: Self-pay | Admitting: Orthopaedic Surgery

## 2018-08-26 ENCOUNTER — Ambulatory Visit: Payer: Medicare Other | Admitting: Orthopaedic Surgery

## 2018-08-26 VITALS — BP 135/72 | HR 63 | Ht 66.5 in | Wt 174.0 lb

## 2018-08-26 DIAGNOSIS — M25562 Pain in left knee: Secondary | ICD-10-CM | POA: Diagnosis not present

## 2018-08-26 DIAGNOSIS — G8929 Other chronic pain: Secondary | ICD-10-CM | POA: Diagnosis not present

## 2018-08-26 DIAGNOSIS — I4819 Other persistent atrial fibrillation: Secondary | ICD-10-CM

## 2018-08-26 DIAGNOSIS — Z9981 Dependence on supplemental oxygen: Secondary | ICD-10-CM | POA: Diagnosis not present

## 2018-08-26 NOTE — Progress Notes (Signed)
Patient ZO:XWRUE Tracey Dudley, female DOB:[birth year], 72 y.o. AVW:[id number]  Chief Complaint  Patient presents with  . Knee Pain    HPI  Tracey Dudley is a 72 y.o. female who has continued pain of the left knee. She has less swelling and popping but has more pain with increased walking and cold weather.  She has no new trauma, no giving way, no locking.  She is taking Tylenol and using Aspercreme.   Body mass index is 27.66 kg/m.  ROS  Review of Systems  Constitutional: Positive for activity change.  Respiratory: Positive for shortness of breath (oxygen dependent).   Cardiovascular: Positive for palpitations.  Musculoskeletal: Positive for arthralgias, gait problem and joint swelling.  Psychiatric/Behavioral: The patient is nervous/anxious.   All other systems reviewed and are negative.   All other systems reviewed and are negative.  The following is a summary of the past history medically, past history surgically, known current medicines, social history and family history.  This information is gathered electronically by the computer from prior information and documentation.  I review this each visit and have found including this information at this point in the chart is beneficial and informative.    Past Medical History:  Diagnosis Date  . Anxiety   . Atrial fibrillation (HCC)   . COPD (chronic obstructive pulmonary disease) (HCC)   . Essential hypertension   . Herpes zoster   . Hyperlipidemia   . PAF (paroxysmal atrial fibrillation) (HCC)    Followed by Dr. Leonie Green in [place], also [facility] EP assessment by Dr. Christin Fudge  . 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 Vitamin D deficiency     Past Surgical History:  Procedure Laterality Date  . ABDOMINAL HYSTERECTOMY    . ABLATION OF DYSRHYTHMIC FOCUS  01/02/2018  . AV NODE ABLATION N/A 01/02/2018   Procedure: AV NODE ABLATION;  Surgeon: Marinus Maw, MD;  [facility]VASIVE CV LAB;  Service:  Cardiovascular;  Laterality: N/A;  . CATARACT EXTRACTION    . INSERT / REPLACE / REMOVE PACEMAKER  01/02/2018   DUAL CHAMBER  . PACEMAKER IMPLANT N/A 01/02/2018   Procedure: PACEMAKER IMPLANT - Dual Chamber;  Surgeon: Marinus Maw, MD;  Location: [facility] INVASIVE CV LAB;  Service: Cardiovascular;  Laterality: N/A;    Family History  Problem Relation Age of Onset  . Breast cancer Sister   . CAD Father   . Asthma Mother     Social History Social History   Tobacco Use  . Smoking status: Former Smoker    Packs/day: 0.50    Years: 30.00    Pack years: 15.00    Types: Cigarettes    Start date: 11/03/1977    Last attempt to quit: 06/01/2014    Years since quitting: 4.2  . Smokeless tobacco: Never Used  Substance Use Topics  . Alcohol use: No    Alcohol/week: 0.0 standard drinks  . Drug use: No    Allergies  Allergen Reactions  . Metoprolol Shortness Of Breath and Other (See Comments)    Tiredness  . Erythromycin Nausea Only    Current Outpatient Medications  Medication Sig Dispense Refill  . acetaminophen (TYLENOL) 500 MG tablet Take 500 mg by mouth every 6 (six) hours as needed for moderate pain or headache.     . albuterol (PROVENTIL HFA;VENTOLIN HFA) 108 (90 BASE) MCG/ACT inhaler Inhale 1 puff into the lungs every 6 (six) hours as needed for wheezing or shortness of breath.    Marland Kitchen  ALPRAZolam (XANAX) 0.5 MG tablet Take 0.25-1 mg by mouth 3 (three) times daily as needed for anxiety.     . Artificial Tear Solution (SOOTHE XP) SOLN Place 1-2 drops into both eyes daily as needed (dry eyes).     Marland Kitchen. atorvastatin (LIPITOR) 20 MG tablet Take 20 mg by mouth every evening.     . Azilsartan Medoxomil (EDARBI) 80 MG TABS TAKE 80 MG BY MOUTH EVERY DAY 90 tablet 3  . BYSTOLIC 20 MG TABS TAKE 1 TABLET BY MOUTH EVERY DAY (Patient taking differently: TAKE 20 MG BY MOUTH EVERY DAY) 30 tablet 6  . diltiazem (CARDIZEM) 30 MG tablet Take 1 tablet (30 mg total) by mouth 3 (three) times daily. PRN for  AFIB (Patient taking differently: Take 30 mg by mouth 3 (three) times daily as needed (for AFIB). ) 270 tablet 3  . dofetilide (TIKOSYN) 250 MCG capsule TAKE ONE CAPSULE BY MOUTH EVERY 12 HOURS (Patient taking differently: TAKE 250 MCG BY MOUTH EVERY 12 HOURS) 60 capsule 10  . hydrALAZINE (APRESOLINE) 100 MG tablet TAKE 1 TABLET BY MOUTH THREE TIMES A DAY 270 tablet 1  . OXYGEN Inhale 2 L into the lungs continuous.    Marland Kitchen. spironolactone (ALDACTONE) 25 MG tablet TAKE 1/2 TABLET (12.5MG ) BY MOUTH EVERY DAY 135 tablet 2  . TRELEGY ELLIPTA 100-62.5-25 MCG/INH AEPB INHALE 1 PUFF BY MOUTH EVERY DAY  0  . XARELTO 20 MG TABS tablet TAKE 1 TABLET BY MOUTH WITH FOOD EVERY DAY 90 tablet 2   No current facility-administered medications for this visit.      Physical Exam  Blood pressure 135/72, pulse 63, height 5' 6.5" (1.689 m), weight 174 lb (78.9 kg).  Constitutional: overall normal hygiene, normal nutrition, well developed, normal grooming, normal body habitus. Assistive device:supplemental oxygen  Musculoskeletal: gait and station Limp left, muscle tone and strength are normal, no tremors or atrophy is present.  .  Neurological: coordination overall normal.  Deep tendon reflex/nerve stretch intact.  Sensation normal.  Cranial nerves II-XII intact.   Skin:   Normal overall no scars, lesions, ulcers or rashes. No psoriasis.  Psychiatric: Alert and oriented x 3.  Recent memory intact, remote memory unclear.  Normal mood and affect. Well groomed.  Good eye contact.  Cardiovascular: overall no swelling, no varicosities, no edema bilaterally, normal temperatures of the legs and arms, no clubbing, cyanosis and good capillary refill.  Lymphatic: palpation is normal.  The left lower extremity is examined:  Inspection:  Thigh:  Non-tender and no defects  Knee has swelling 1+ effusion.                        Joint tenderness is present                        Patient is tender over the medial joint  line  Lower Leg:  Has normal appearance and no tenderness or defects  Ankle:  Non-tender and no defects  Foot:  Non-tender and no defects Range of Motion:  Knee:  Range of motion is: 0-100                        Crepitus is  present  Ankle:  Range of motion is normal. Strength and Tone:  The left lower extremity has normal strength and tone. Stability:  Knee:  The knee is stable.  Ankle:  The ankle is stable.  All other systems reviewed and are negative   The patient has been educated about the nature of the problem(s) and counseled on treatment options.  The patient appeared to understand what I have discussed and is in agreement with it.  Encounter Diagnoses  Name Primary?  . Chronic pain of left knee Yes  . Persistent atrial fibrillation   . Dependence on continuous supplemental oxygen     PLAN Call if any problems.  Precautions discussed.  Continue current medications.   Return to clinic 2 months   Electronically Signed Darreld Mclean, MD 12/10/20192:04 PM

## 2018-08-26 NOTE — Patient Instructions (Signed)
Glucosamine Chondroitin.

## 2018-08-27 ENCOUNTER — Telehealth (INDEPENDENT_AMBULATORY_CARE_PROVIDER_SITE_OTHER): Payer: Self-pay | Admitting: *Deleted

## 2018-08-27 ENCOUNTER — Other Ambulatory Visit (INDEPENDENT_AMBULATORY_CARE_PROVIDER_SITE_OTHER): Payer: Self-pay | Admitting: *Deleted

## 2018-08-27 ENCOUNTER — Encounter (INDEPENDENT_AMBULATORY_CARE_PROVIDER_SITE_OTHER): Payer: Self-pay | Admitting: *Deleted

## 2018-08-27 ENCOUNTER — Telehealth: Payer: Self-pay | Admitting: Cardiology

## 2018-08-27 DIAGNOSIS — Z1211 Encounter for screening for malignant neoplasm of colon: Secondary | ICD-10-CM | POA: Insufficient documentation

## 2018-08-27 MED ORDER — PEG 3350-KCL-NA BICARB-NACL 420 G PO SOLR: 4000.0000 mL | Freq: Once | ORAL | 0 refills | Status: AC

## 2018-08-27 NOTE — Telephone Encounter (Signed)
Patient called and stated that she believes she is in atrial fib b/c her blood pressure monitor says her heart rate is 105-122 bpm. She is aware that is not accurate but she said it has been accurate for her in the past. Instructed pt to send a remote transmission. Once the transmission is received a RN will review and call back with the results. Pt verbalized understanding.

## 2018-08-27 NOTE — Telephone Encounter (Signed)
Spoke with patient. She reports her BP cuff reported her HR as 122bpm and irregular earlier today, so she took a PRN diltiazem 30mg  tablet. Later her HR read 115bpm and irregular, so she took another PRN diltiazem. Now her HR in 75bpm (still irregular) after her third PRN diltiazem and her BP is 128/60. Patient denies symptoms other than feeling the irregularity. She reports that the last time she had an AF episode, she took her PRN diltiazem and converted back to SR.  Patient reports compliance with all medications, including Tikosyn and Xarelto. Patient lives in [place], so she cannot get to [place] easily. Able to obtain remote transmission that patient sent earlier today (her phone was disconnected from the internet so there was a lag).   Transmission shows that an AT/AF episode has been in progress since today at 1124. Presenting rhythm from 1314 shows A-flutter/Vs at 93-117bpm.  Advised patient to rest this evening as her HR is controlled. Will call back in the morning and obtain a transmission to recheck rhythm. Patient requests call after 0900. ED precautions given. Patient appreciative of call.

## 2018-08-27 NOTE — Telephone Encounter (Signed)
Patient is scheduled for colonoscopy 09/26/18 -- she needs to stop Xarelto 2 days before procedure -- please advise of ok to stop

## 2018-08-27 NOTE — Telephone Encounter (Signed)
Referring MD/PCP: rachel mcgee   Procedure: tcs w propofol  Reason/Indication:  screening  Has patient had this procedure before?  Yes, 10 yrs ago  If so, when, by whom and where?    Is there a family history of colon cancer?  no  Who?  What age when diagnosed?    Is patient diabetic?   no      Does patient have prosthetic heart valve or mechanical valve?  no  Do you have a pacemaker?  yesno  Has patient ever had endocarditis? no  Has patient had joint replacement within last 12 months?  no  Is patient constipated or do they take laxatives? no  Does patient have a history of alcohol/drug use?  no  Is patient on blood thinner such as Coumadin, Plavix and/or Aspirin? yes  Medications: see epic  Allergies: see epic  Medication Adjustment per Dr Keane Policeehman/Terri Setzer, NP: Carlena Hurlxarelto 2 days  Procedure date & time: 09/26/18 at 930 preop 1/6 at 145

## 2018-08-27 NOTE — Telephone Encounter (Signed)
Patient needs trilyte 

## 2018-08-27 NOTE — Telephone Encounter (Signed)
Patient aware -- forwarded to Dr Rehman for review  

## 2018-08-27 NOTE — Telephone Encounter (Signed)
OK to hold Xarelto 2 days before procedure.  Resume Xarelto night of procedure if ok with MD.

## 2018-08-27 NOTE — Telephone Encounter (Signed)
agree

## 2018-08-28 ENCOUNTER — Encounter: Payer: Self-pay | Admitting: Cardiology

## 2018-08-28 ENCOUNTER — Ambulatory Visit: Payer: Medicare Other | Admitting: Cardiology

## 2018-08-28 VITALS — BP 134/70 | HR 92 | Ht 66.0 in | Wt 173.0 lb

## 2018-08-28 DIAGNOSIS — I480 Paroxysmal atrial fibrillation: Secondary | ICD-10-CM | POA: Diagnosis not present

## 2018-08-28 DIAGNOSIS — E782 Mixed hyperlipidemia: Secondary | ICD-10-CM | POA: Diagnosis not present

## 2018-08-28 DIAGNOSIS — J449 Chronic obstructive pulmonary disease, unspecified: Secondary | ICD-10-CM | POA: Diagnosis not present

## 2018-08-28 DIAGNOSIS — I10 Essential (primary) hypertension: Secondary | ICD-10-CM | POA: Diagnosis not present

## 2018-08-28 MED ORDER — DILTIAZEM HCL 30 MG PO TABS: 30.0000 mg | ORAL_TABLET | Freq: Three times a day (TID) | ORAL | 3 refills | Status: DC

## 2018-08-28 MED ORDER — HYDRALAZINE HCL 100 MG PO TABS: 50.0000 mg | ORAL_TABLET | Freq: Three times a day (TID) | ORAL | 3 refills | Status: DC

## 2018-08-28 NOTE — Telephone Encounter (Signed)
Pt was able to send transmission. Will call her with any recommendations after it is reviewed. Pt states she is feeling better today. She has taken her PRN dilt today. She plans on taking another dose in the afternoon if her  BP is normal.

## 2018-08-28 NOTE — Telephone Encounter (Signed)
Transmission received and reviewed. Presenting rhythm shows AF/A-flutter, Vs rate 90s-110s. 100% AT/AF burden since yesterday.   [facility] to see if any appointments available with MD/APP.

## 2018-08-28 NOTE — Telephone Encounter (Signed)
Spoke with patient. She reports her BP is now ~111/60 after taking PRN diltiazem this morning. She is concerned about taking another dose later today because she does not want her BP to get too low. Patient otherwise feels okay right now. She is agreeable to an appointment with Dr. Diona BrownerMcDowell today in [place] at 1:40pm for further assessment. Patient is appreciative of call and assistance.

## 2018-08-28 NOTE — Patient Instructions (Signed)
Medication Instructions:  DECREASE Hydralazine to 50 mg three times a day  Take Cardizem 30 mg three times a day   If you need a refill on your cardiac medications before your next appointment, please call your pharmacy.   Lab work: NONE If you have labs (blood work) drawn today and your tests are completely normal, you will receive your results only by: Marland Kitchen. MyChart Message (if you have MyChart) OR . A paper copy in the mail If you have any lab test that is abnormal or we need to change your treatment, we will call you to review the results.  Testing/Procedures: NONE  Follow-Up:Dr Ladona Ridgelaylor as soon as possible   Any Other Special Instructions Will Be Listed Below (If Applicable). NONE

## 2018-08-28 NOTE — Progress Notes (Signed)
Cardiology Office Note  Date: 08/28/2018   ID: ADARA KITTLE, DOB 09/15/1946, MRN [id number]  PCP: Jonathon Bellows, DO  Primary Cardiologist: Nona Dell, MD   Chief Complaint  Patient presents with  . Atrial Fibrillation    History of Present Illness: Tracey Dudley is a 72 y.o. female seen recently in November.  She was added on to my schedule today after recent findings of elevated heart rate by home check and subsequently documentation of AT/AF by device interrogation with onset December 11.  I reviewed nursing notes.  She states that she took 3 short acting Cardizem tablets yesterday as directed, heart rate better controlled and blood pressure low normal range.  She took one short acting Cardizem this morning.  She does recall having an episode of atrial fibrillation within the last 6 months that resolved spontaneously in a few days after using short acting Cardizem.  She states that she has been compliant with Xarelto and Tikosyn.  She sees Dr. Ladona Ridgel with a history of incomplete AV node ablation with placement of Medtronic pacemaker and His bundle lead.  I personally reviewed her ECG today which shows atrial fibrillation with aberrantly conducted complexes, incomplete right bundle branch block, heart rate 82.  Past Medical History:  Diagnosis Date  . Anxiety   . Atrial fibrillation (HCC)   . COPD (chronic obstructive pulmonary disease) (HCC)   . Essential hypertension   . Herpes zoster   . Hyperlipidemia   . PAF (paroxysmal atrial fibrillation) (HCC)    Followed by Dr. Leonie Green in [place], also [facility] EP assessment by Dr. Christin Fudge  . 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 Vitamin D deficiency     Past Surgical History:  Procedure Laterality Date  . ABDOMINAL HYSTERECTOMY    . ABLATION OF DYSRHYTHMIC FOCUS  01/02/2018  . AV NODE ABLATION N/A 01/02/2018   Procedure: AV NODE ABLATION;  Surgeon: Marinus Maw, MD;  Location: [facility] INVASIVE CV  LAB;  Service: Cardiovascular;  Laterality: N/A;  . CATARACT EXTRACTION    . INSERT / REPLACE / REMOVE PACEMAKER  01/02/2018   DUAL CHAMBER  . PACEMAKER IMPLANT N/A 01/02/2018   Procedure: PACEMAKER IMPLANT - Dual Chamber;  Surgeon: Marinus Maw, MD;  Location: [facility] INVASIVE CV LAB;  Service: Cardiovascular;  Laterality: N/A;    Current Outpatient Medications  Medication Sig Dispense Refill  . acetaminophen (TYLENOL) 500 MG tablet Take 500 mg by mouth every 6 (six) hours as needed for moderate pain or headache.     . albuterol (PROVENTIL HFA;VENTOLIN HFA) 108 (90 BASE) MCG/ACT inhaler Inhale 1 puff into the lungs every 6 (six) hours as needed for wheezing or shortness of breath.    . ALPRAZolam (XANAX) 0.5 MG tablet Take 0.25-1 mg by mouth 3 (three) times daily as needed for anxiety.     . Artificial Tear Solution (SOOTHE XP) SOLN Place 1-2 drops into both eyes daily as needed (dry eyes).     Marland Kitchen atorvastatin (LIPITOR) 20 MG tablet Take 20 mg by mouth every evening.     . Azilsartan Medoxomil (EDARBI) 80 MG TABS TAKE 80 MG BY MOUTH EVERY DAY 90 tablet 3  . BYSTOLIC 20 MG TABS TAKE 1 TABLET BY MOUTH EVERY DAY (Patient taking differently: TAKE 20 MG BY MOUTH EVERY DAY) 30 tablet 6  . diltiazem (CARDIZEM) 30 MG tablet Take 1 tablet (30 mg total) by mouth 3 (three) times daily. 270 tablet 3  .  dofetilide (TIKOSYN) 250 MCG capsule TAKE ONE CAPSULE BY MOUTH EVERY 12 HOURS (Patient taking differently: TAKE 250 MCG BY MOUTH EVERY 12 HOURS) 60 capsule 10  . hydrALAZINE (APRESOLINE) 100 MG tablet Take 0.5 tablets (50 mg total) by mouth 3 (three) times daily. TAKE 1 TABLET BY MOUTH THREE TIMES A DAY 270 tablet 3  . OXYGEN Inhale 2 L into the lungs continuous.    Marland Kitchen spironolactone (ALDACTONE) 25 MG tablet TAKE 1/2 TABLET (12.5MG ) BY MOUTH EVERY DAY 135 tablet 2  . TRELEGY ELLIPTA 100-62.5-25 MCG/INH AEPB INHALE 1 PUFF BY MOUTH EVERY DAY  0  . XARELTO 20 MG TABS tablet TAKE 1 TABLET BY MOUTH WITH FOOD EVERY  DAY 90 tablet 2   No current facility-administered medications for this visit.    Allergies:  Metoprolol and Erythromycin   Social History: The patient  reports that she quit smoking about 4 years ago. Her smoking use included cigarettes. She started smoking about 40 years ago. She has a 15.00 pack-year smoking history. She has never used smokeless tobacco. She reports that she does not drink alcohol or use drugs.   ROS:  Please see the history of present illness. Otherwise, complete review of systems is positive for anxiety, chronic dyspnea on exertion, uses supplemental oxygen.  All other systems are reviewed and negative.   Physical Exam: VS:  BP 134/70 (BP Location: Right Arm)   Pulse 92   Ht 5\' 6"  (1.676 m)   Wt 173 lb (78.5 kg)   SpO2 94%   BMI 27.92 kg/m , BMI Body mass index is 27.92 kg/m.  Wt Readings from Last 3 Encounters:  08/28/18 173 lb (78.5 kg)  08/26/18 174 lb (78.9 kg)  07/30/18 174 lb (78.9 kg)    General: Patient appears comfortable at rest.  Wearing oxygen via nasal cannula. HEENT: Conjunctiva and lids normal, oropharynx clear. Neck: Supple, no elevated JVP or carotid bruits, no thyromegaly. Lungs: Decreased breath sounds without wheezing, nonlabored breathing at rest. Cardiac: Irregularly irregular, no S3, soft systolic murmur. Abdomen: Soft, nontender, bowel sounds present. Extremities: No pitting edema, distal pulses 2+. Skin: Warm and dry. Musculoskeletal: No kyphosis. Neuropsychiatric: Alert and oriented x3, affect grossly appropriate.  ECG: I personally reviewed the tracing from 04/08/2018 which showed an atrial paced rhythm with right bundle branch block.  Recent Labwork: 11/28/2017: ALT 13; AST 18 12/25/2017: BUN 19; Creatinine, Ser 1.26; Hemoglobin 11.7; Platelets 191; Potassium 4.7; Sodium 132  September 2019: Hemoglobin 10.8, platelets 120, BUN 22, creatinine 1.3, potassium 4.9, AST 20, ALT 29, cholesterol 164, HDL 62, triglycerides 88, LDL 87,  magnesium 2.0, TSH 1.89  Other Studies Reviewed Today:  Echocardiogram 10/10/2015: Study Conclusions  - Left ventricle: The cavity size was normal. Wall thickness was increased in a pattern of mild LVH. Systolic function was vigorous. The estimated ejection fraction was in the range of 75% to 80%. Wall motion was normal; there were no regional wall motion abnormalities. Features are consistent with a pseudonormal left ventricular filling pattern, with concomitant abnormal relaxation and increased filling pressure (grade 2 diastolic dysfunction). - Aortic valve: Poorly visualized. Mildly calcified annulus. Probably trileaflet. Mean gradient (S): 13 mm Hg. Peak gradient (S): 24 mm Hg. Gradients likely increased due to relatively small LVOT and vigorous contraction. Limited views of valve leaflets show grossly preserved excursion. - Mitral valve: Mildly thickened leaflets . There was mild regurgitation. - Left atrium: The atrium was at the upper limits of normal in size. - Right atrium: Central venous  pressure (est): 3 mm Hg. - Tricuspid valve: There was trivial regurgitation. - Pulmonary arteries: Systolic pressure could not be accurately estimated. - Pericardium, extracardiac: A prominent pericardial fat pad was present.  Impressions:  - Mild LVH with LVEF 75-80% and grade 2 diastolic dysfunction with increased filling pressures. Normal left atrial chamber size. Mildly thickened mitral leaflets with mild mitral regurgitation. Aortic valve appears mildly sclerotic without definitive stenosis. Trivial tricuspid regurgitation, PASP not estimated.  Assessment and Plan:  1.  Paroxysmal atrial fibrillation, recent onset episode starting December 11.  She is on Tikosyn and Xarelto, also status post incomplete AV node ablation with placement of Medtronic His bundle pacemaker by Dr. Ladona Ridgelaylor.  ECG confirms rhythm.  Today we talked about various  options.  Although we could pursue an elective cardioversion, this would not be a long-term treatment strategy as she has already demonstrated many times that she reverts to atrial fibrillation even on antiarrhythmic therapy.  If we could adequately control her heart rate and symptoms while in atrial fibrillation, she could be managed with heart rate control and anticoagulation strategy for permanent atrial fibrillation.  It may not be as difficult to control her heart rate after the incomplete AV node ablation.  For now I have asked her to take the short acting diltiazem 30 mg tablets 3 times a day for heart rate control, concurrently reduce hydralazine to 50 mg 3 times daily to avoid hypotension.  She did convert in the past with heart rate control strategy and will let us know how she is doing next week.  Ultimately, if she feels better with heart rate control in atrial fibrillation, I would likely put her on Cardizem CD and continue Bystolic along with Xarelto.  If we are not able to control her heart rate adequately, then consideration could be given to a repeat attempt at complete AV node ablation.  Either way I will have her follow-up with Dr. Ladona Ridgelaylor.  If we adopt a permanent atrial fibrillation strategy, Tikosyn could likely be discontinued as well.  2.  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  She continues in pulmonary rehabilitation, otherwise stable.  3.  Mixed hyperlipidemia on Lipitor.  4.  Essential hypertension, blood pressure well controlled today.   Current medicines were reviewed with the patient today.   Orders Placed This Encounter  Procedures  . EKG 12-Lead    Disposition: Patient to call back next week, further recommendations to follow.  Signed, Jonelle SidleSamuel G. Aiden Rao, MD, [facility] 08/28/2018 2:29 PM    [facility] at [facility] [address], [place], [place] [address] Phone: [phone]; Fax: [phone]

## 2018-08-30 ENCOUNTER — Other Ambulatory Visit: Payer: Self-pay | Admitting: Cardiology

## 2018-09-16 ENCOUNTER — Telehealth: Payer: Self-pay | Admitting: Cardiology

## 2018-09-16 MED ORDER — DILTIAZEM HCL ER COATED BEADS 120 MG PO CP24: 120.0000 mg | ORAL_CAPSULE | Freq: Every day | ORAL | 3 refills | Status: DC

## 2018-09-16 NOTE — Telephone Encounter (Signed)
Called pt. Informed her of recommendations. She voiced understanding of plan. She will keep a bp/hr log and call us back in a week with updates.

## 2018-09-16 NOTE — Telephone Encounter (Signed)
Patient is calling to speak with  Nurse regarding medications. / tg

## 2018-09-16 NOTE — Telephone Encounter (Signed)
    Please let the patient know that Dr. Diona BrownerMcDowell is out this week and therefore I am going by instructions outlined at the time of her last office visit. He had recommended if symptoms improved, would switch from short-acting Cardizem to long-acting Cardizem CD as there is a high likelihood for recurrence and our goal is to keep her HR from becoming significantly elevated with this. Cardizem can certainly cause fatigue but this is typically more common with the short acting doses as compared to sustained release. At this time, would recommend stopping the short acting Cardizem 30 mg 3 times daily and switching to Cardizem CD 120 mg daily. If she still experiences fatigue with this, could consider going back to short-acting PRN dosing. Would continue with lower dose of Hydralazine while taking Cardizem daily. Continue to keep HR/BP log.   Signed, Ellsworth LennoxBrittany M Strader, PA-C 09/16/2018, 12:05 PM Pager: [phone]

## 2018-09-16 NOTE — Telephone Encounter (Signed)
Returned pt call. She states that she is no longer in afib and would like to go back to taking her medications prior to the changes made during last office visit. She does not wish to continue taking her Cardizem 30 mg - three times daily as it makes her very tired. She would like to go back to taking it when needed for her afib. She would also like to go back to her previous dose of hydralazine @ 100 mg - three times daily. Please advise.

## 2018-09-18 ENCOUNTER — Encounter (INDEPENDENT_AMBULATORY_CARE_PROVIDER_SITE_OTHER): Payer: Self-pay | Admitting: *Deleted

## 2018-09-22 ENCOUNTER — Inpatient Hospital Stay (HOSPITAL_COMMUNITY): Admission: RE | Admit: 2018-09-22 | Payer: Medicare Other | Source: Ambulatory Visit

## 2018-09-25 ENCOUNTER — Other Ambulatory Visit: Payer: Self-pay | Admitting: Cardiology

## 2018-10-01 ENCOUNTER — Ambulatory Visit: Payer: Medicare Other | Admitting: Internal Medicine

## 2018-10-01 ENCOUNTER — Encounter: Payer: Self-pay | Admitting: Internal Medicine

## 2018-10-01 VITALS — BP 132/76 | HR 87 | Ht 66.0 in | Wt 172.0 lb

## 2018-10-01 DIAGNOSIS — I4819 Other persistent atrial fibrillation: Secondary | ICD-10-CM | POA: Diagnosis not present

## 2018-10-01 MED ORDER — DILTIAZEM HCL ER COATED BEADS 120 MG PO CP24: 120.0000 mg | ORAL_CAPSULE | Freq: Two times a day (BID) | ORAL | 3 refills | Status: DC

## 2018-10-01 NOTE — Patient Instructions (Addendum)
Medication Instructions:  Your physician has recommended you make the following change in your medication:  Stop taking Hydrazine  Increase Diltiazem to 120 mg two times daily   If you need a refill on your cardiac medications before your next appointment, please call your pharmacy.   Lab work: NONE  If you have labs (blood work) drawn today and your tests are completely normal, you will receive your results only by: Marland Kitchen MyChart Message (if you have MyChart) OR . A paper copy in the mail If you have any lab test that is abnormal or we need to change your treatment, we will call you to review the results.  Testing/Procedures: NONE   Follow-Up: At [facility], you and your health needs are our priority.  As part of our continuing mission to provide you with exceptional heart care, we have created designated Provider Care Teams.  These Care Teams include your primary Cardiologist (physician) and Advanced Practice Providers (APPs -  Physician Assistants and Nurse Practitioners) who all work together to provide you with the care you need, when you need it. You will need a follow up appointment in 3 months.  Please call our office 2 months in advance to schedule this appointment.  You may see Lewayne Bunting, MD or one of the following Advanced Practice Providers on your designated Care Team:   Gypsy Balsam, NP . Francis Dowse, PA-C  Any Other Special Instructions Will Be Listed Below (If Applicable). Thank you for choosing [facility]!

## 2018-10-01 NOTE — Progress Notes (Signed)
HPI [name] returns today for ongoing evaluation and management of her atrial fib. She underwent AV node ablation and PPM insertion after DCCV was unsuccessful at maintaining her in NSR. She has been stable. She has maintained NSR on dofetilide and she has multiple complaints today. when she underwent her AV node ablation, we had trouble achieving CHB because her His bundle lead was very close to the site of ablation and I was concerned about damaging the lead or ablation below the his bundle lead and exit block. Her VR in atrial fib has been better. She has been out of rhythm for over 2 weeks. She had previously had an URI. She does not feel as bad as her VR in atrial fib is much slower after her AV node ablation.  Allergies  Allergen Reactions  . Metoprolol Shortness Of Breath and Other (See Comments)    Tiredness  . Erythromycin Nausea Only     Current Outpatient Medications  Medication Sig Dispense Refill  . acetaminophen (TYLENOL) 500 MG tablet Take 500 mg by mouth every 6 (six) hours as needed for moderate pain or headache.     . albuterol (PROVENTIL HFA;VENTOLIN HFA) 108 (90 BASE) MCG/ACT inhaler Inhale 1 puff into the lungs every 6 (six) hours as needed for wheezing or shortness of breath.    . ALPRAZolam (XANAX) 0.5 MG tablet Take 0.25-1 mg by mouth 3 (three) times daily as needed for anxiety.     Marland Kitchen. atorvastatin (LIPITOR) 20 MG tablet Take 20 mg by mouth every evening.     . Azilsartan Medoxomil (EDARBI) 80 MG TABS TAKE 80 MG BY MOUTH EVERY DAY 90 tablet 3  . diltiazem (CARDIZEM CD) 120 MG 24 hr capsule Take 1 capsule (120 mg total) by mouth daily. 90 capsule 3  . dofetilide (TIKOSYN) 250 MCG capsule TAKE ONE CAPSULE BY MOUTH EVERY 12 HOURS 180 capsule 3  . hydrALAZINE (APRESOLINE) 100 MG tablet TAKE 1 TABLET BY MOUTH THREE TIMES A DAY (Patient taking differently: 50 mg 3 (three) times daily. ) 270 tablet 1  . Nebivolol HCl (BYSTOLIC) 20 MG TABS TAKE 20 MG BY MOUTH EVERY DAY  90 tablet 2  . OXYGEN Inhale 2 L into the lungs continuous.    Marland Kitchen. spironolactone (ALDACTONE) 25 MG tablet TAKE 1/2 TABLET (12.5MG ) BY MOUTH EVERY DAY 135 tablet 2  . TRELEGY ELLIPTA 100-62.5-25 MCG/INH AEPB INHALE 1 PUFF BY MOUTH EVERY DAY  0  . XARELTO 20 MG TABS tablet TAKE 1 TABLET BY MOUTH WITH FOOD EVERY DAY 90 tablet 2   No current facility-administered medications for this visit.      Past Medical History:  Diagnosis Date  . Anxiety   . Atrial fibrillation (HCC)   . COPD (chronic obstructive pulmonary disease) (HCC)   . Essential hypertension   . Herpes zoster   . Hyperlipidemia   . PAF (paroxysmal atrial fibrillation) (HCC)    Follow[place] by Dr. Leonie Dudley in [place], also [facility] EP assessment by Dr. Christin Dudley  . 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 Vitamin D deficiency     ROS:   All systems reviewed and negative except as noted in the HPI.   Past Surgical History:  Procedure Laterality Date  . ABDOMINAL HYSTERECTOMY    . ABLATION OF DYSRHYTHMIC FOCUS  01/02/2018  . AV NODE ABLATION N/A 01/02/2018   Procedure: AV NODE ABLATION;  Surgeon: Tracey Dudley, Tracey Dudley W, MD;  [facility];  Service: Cardiovascular;  Laterality: N/A;  . CATARACT EXTRACTION    . INSERT / REPLACE / REMOVE PACEMAKER  01/02/2018   DUAL CHAMBER  . PACEMAKER IMPLANT N/A 01/02/2018   Procedure: PACEMAKER IMPLANT - Dual Chamber;  Surgeon: Tracey Dudley, Tracey Santarelli W, MD;  Location: [facility] INVASIVE CV LAB;  Service: Cardiovascular;  Laterality: N/A;     Family History  Problem Relation Age of Onset  . Breast cancer Sister   . CAD Father   . Asthma Mother      Social History   Socioeconomic History  . Marital status: Married    Spouse name: Not on file  . Number of children: Not on file  . Years of education: Not on file  . Highest education level: Not on file  Occupational History  . Not on file  Social Needs  . Financial resource strain: Not on file  . Food  insecurity:    Worry: Not on file    Inability: Not on file  . Transportation needs:    Medical: Not on file    Non-medical: Not on file  Tobacco Use  . Smoking status: Former Smoker    Packs/day: 0.50    Years: 30.00    Pack years: 15.00    Types: Cigarettes    Start date: 11/03/1977    Last attempt to quit: 06/01/2014    Years since quitting: 4.3  . Smokeless tobacco: Never Used  Substance and Sexual Activity  . Alcohol use: No    Alcohol/week: 0.0 standard drinks  . Drug use: No  . Sexual activity: Yes  Lifestyle  . Physical activity:    Days per week: Not on file    Minutes per session: Not on file  . Stress: Not on file  Relationships  . Social connections:    Talks on phone: Not on file    Gets together: Not on file    Attends religious service: Not on file    Active member of club or organization: Not on file    Attends meetings of clubs or organizations: Not on file    Relationship status: Not on file  . Intimate partner violence:    Fear of current or ex partner: Not on file    Emotionally abused: Not on file    Physically abused: Not on file    Forced sexual activity: Not on file  Other Topics Concern  . Not on file  Social History Narrative  . Not on file     BP 132/76   Pulse 87   Ht 5\' 6"  (1.676 m)   Wt 172 lb (78 kg)   SpO2 97%   BMI 27.76 kg/m   Physical Exam:  Well appearing 73 yo woman, NAD HEENT: Unremarkable Neck:  6 cm JVD, no thyromegally Lymphatics:  No adenopathy Back:  No CVA tenderness Lungs:  Clear with no wheezes HEART:  Regular rate rhythm, no murmurs, no rubs, no clicks Abd:  soft, positive bowel sounds, no organomegally, no rebound, no guarding Ext:  2 plus pulses, no edema, no cyanosis, no clubbing Skin:  No rashes no nodules Neuro:  CN II through XII intact, motor grossly intact  DEVICE  Normal device function.  See PaceArt for details. She is in atrial fib  Assess/Plan: 1. Persistent atrial fib - she is out of  rhythm. If she remains so, I would be inclined to stop her dofetilide.  2. PPM - her medtronic device is working normally and her rate  response has been turned on.  3. HTN - her blood pressure has been low normal. I have recommended she stop hydralazine and increase her cardizem to 120 bid. 4. Copd - she remains on oxygen. She is not a candidate for amiodarone. She will continue her bronchodilators.   Leonia Reeves.D.

## 2018-10-09 NOTE — Patient Instructions (Signed)
Tracey KindredLinda E Dudley  10/09/2018     @PREFPERIOPPHARMACY @   Your procedure is scheduled on  10/17/2018   [facility] to [facility] at  730   A.M.  Call this number if you have problems the morning of surgery:  [phone]   Remember:  Follow the diet and prep instructions given to you by Dr Patty Sermonsehman's office.                       Take these medicines the morning of surgery with A SIP OF WATER Xanax, edarbi, diltazem, tikosyn, bystolic. Use your inhaler before you come.    Do not wear jewelry, make-up or nail polish.  Do not wear lotions, powders, or perfumes, or deodorant.  Do not shave 48 hours prior to surgery.  Men may shave face and neck.  Do not bring valuables to the hospital.  [facility] any belongings or valuables.  Contacts, dentures or bridgework may not be worn into surgery.  Leave your suitcase in the car.  After surgery it may be brought to your room.  For patients admitted to the hospital, discharge time will be determined by your treatment team.  Patients discharged the day of surgery will not be allowed to drive home.   Name and phone number of your driver:   family Special instructions:  Follow any instructions given to you about your Xarelto.  Please read over the following fact sheets that you were given. Anesthesia Post-op Instructions and Care and Recovery After Surgery       Colonoscopy, Adult A colonoscopy is an exam to look at the large intestine. It is done to check for problems, such as:  Lumps (tumors).  Growths (polyps).  Swelling (inflammation).  Bleeding. What happens before the procedure? Eating and drinking Follow instructions from your doctor about eating and drinking. These instructions may include:  A few days before the procedure - follow a low-fiber diet. ? Avoid nuts. ? Avoid seeds. ? Avoid dried fruit. ? Avoid raw fruits. ? Avoid vegetables.  1-3 days before the procedure - follow a clear  liquid diet. Avoid liquids that have red or purple dye. Drink only clear liquids, such as: ? Clear broth or bouillon. ? Black coffee or tea. ? Clear juice. ? Clear soft drinks or sports drinks. ? Gelatin dessert. ? Popsicles.  On the day of the procedure - do not eat or drink anything during the 2 hours before the procedure. Up to 2 hours before the procedure, you may continue to drink clear liquids, such as water or clear fruit juice.  Bowel prep If you were prescribed an oral bowel prep:  Take it as told by your doctor. Starting the day before your procedure, you will need to drink a lot of liquid. The liquid will cause you to poop (have bowel movements) until your poop is almost clear or light green.  To clean out your colon, you may also be given: ? Laxative medicines. ? Instructions about how to use an enema.  If your skin or butt gets irritated from diarrhea, you may: ? Wipe the area with wipes that have medicine in them, such as adult wet wipes with aloe and vitamin E. ? Put something on your skin that soothes the area, such as petroleum jelly.  If you throw up (vomit) while drinking the bowel prep, take a break for up to 60 minutes.  Then begin the bowel prep again. If you keep throwing up and you cannot take the bowel prep without throwing up, call your doctor. General instructions  Ask your doctor about: ? Changing or stopping your normal medicines. This is important if you take iron pills, diabetes medicines, or blood thinners. ? Taking medicines such as aspirin and ibuprofen. These medicines can thin your blood. Do not take these medicines unless your doctor tells you to take them.  Plan to have someone take you home from the hospital or clinic. What happens during the procedure?   An IV tube may be put into one of your veins.  You will be given medicine to help you relax (sedative).  To reduce your risk of infection: ? Your doctors will wash their hands. ? Your  anal area will be washed with soap.  You will be asked to lie on your side with your knees bent.  Your doctor will get a long, thin, flexible tube ready. The tube will have a camera and a light on the end.  The tube will be put into your anus.  The tube will be gently put into your large intestine.  Air will be delivered into your large intestine to keep it open. You may feel some pressure or cramping.  The camera will be used to take photos.  A small tissue sample may be removed for testing (biopsy).  If small growths are found, your doctor may remove them and have them checked for cancer.  The tube that was put into your anus will be slowly removed. The procedure may vary among doctors and hospitals. What happens after the procedure?  Your doctor will check on you often until the medicines you were given have worn off.  Do not drive for 24 hours after the procedure.  You may have a small amount of blood in your poop.  You may pass gas.  You may have mild cramps or bloating in your belly (abdomen).  It is up to you to get the results of your procedure. Ask your doctor, or the department performing the procedure, when your results will be ready. Summary  A colonoscopy is an exam to look at the large intestine.  Follow instructions from your doctor about eating and drinking before the procedure.  If you were prescribed an oral bowel prep to clean out your colon, take it as told by your doctor.  Your doctor will check on you often until the medicines you were given have worn off.  Plan to have someone take you home from the hospital or clinic. This information is not intended to replace advice given to you by your health care provider. Make sure you discuss any questions you have with your health care provider. Document Released: 10/06/2010 Document Revised: 07/03/2017 Document Reviewed: 11/15/2015 Elsevier Interactive Patient Education  2019 [employer]  Colonoscopy,  Adult, Care After This sheet gives you information about how to care for yourself after your procedure. Your health care provider may also give you more specific instructions. If you have problems or questions, contact your health care provider. What can I expect after the procedure? After the procedure, it is common to have:  A small amount of blood in your stool for 24 hours after the procedure.  Some gas.  Mild abdominal cramping or bloating. Follow these instructions at home: General instructions  For the first 24 hours after the procedure: ? Do not drive or use machinery. ? Do not sign important  documents. ? Do not drink alcohol. ? Do your regular daily activities at a slower pace than normal. ? Eat soft, easy-to-digest foods.  Take over-the-counter or prescription medicines only as told by your health care provider. Relieving cramping and bloating   Try walking around when you have cramps or feel bloated.  Apply heat to your abdomen as told by your health care provider. Use a heat source that your health care provider recommends, such as a moist heat pack or a heating pad. ? Place a towel between your skin and the heat source. ? Leave the heat on for 20-30 minutes. ? Remove the heat if your skin turns bright red. This is especially important if you are unable to feel pain, heat, or cold. You may have a greater risk of getting burned. Eating and drinking   Drink enough fluid to keep your urine pale yellow.  Resume your normal diet as instructed by your health care provider. Avoid heavy or fried foods that are hard to digest.  Avoid drinking alcohol for as long as instructed by your health care provider. Contact a health care provider if:  You have blood in your stool 2-3 days after the procedure. Get help right away if:  You have more than a small spotting of blood in your stool.  You pass large blood clots in your stool.  Your abdomen is swollen.  You have  nausea or vomiting.  You have a fever.  You have increasing abdominal pain that is not relieved with medicine. Summary  After the procedure, it is common to have a small amount of blood in your stool. You may also have mild abdominal cramping and bloating.  For the first 24 hours after the procedure, do not drive or use machinery, sign important documents, or drink alcohol.  Contact your health care provider if you have a lot of blood in your stool, nausea or vomiting, a fever, or increased abdominal pain. This information is not intended to replace advice given to you by your health care provider. Make sure you discuss any questions you have with your health care provider. Document Released: 04/17/2004 Document Revised: 06/26/2017 Document Reviewed: 11/15/2015 Elsevier Interactive Patient Education  2019 [employer] Inc.  Monitored Anesthesia Care Anesthesia is a term that refers to techniques, procedures, and medicines that help a person stay safe and comfortable during a medical procedure. Monitored anesthesia care, or sedation, is one type of anesthesia. Your anesthesia specialist may recommend sedation if you will be having a procedure that does not require you to be unconscious, such as:  Cataract surgery.  A dental procedure.  A biopsy.  A colonoscopy. During the procedure, you may receive a medicine to help you relax (sedative). There are three levels of sedation:  Mild sedation. At this level, you may feel awake and relaxed. You will be able to follow directions.  Moderate sedation. At this level, you will be sleepy. You may not remember the procedure.  Deep sedation. At this level, you will be asleep. You will not remember the procedure. The more medicine you are given, the deeper your level of sedation will be. Depending on how you respond to the procedure, the anesthesia specialist may change your level of sedation or the type of anesthesia to fit your needs. An anesthesia  specialist will monitor you closely during the procedure. Let your health care provider know about:  Any allergies you have.  All medicines you are taking, including vitamins, herbs, eye drops, creams, and  over-the-counter medicines.  Any use of steroids (by mouth or as a cream).  Any problems you or family members have had with sedatives and anesthetic medicines.  Any blood disorders you have.  Any surgeries you have had.  Any medical conditions you have, such as sleep apnea.  Whether you are pregnant or may be pregnant.  Any use of cigarettes, alcohol, or street drugs. What are the risks? Generally, this is a safe procedure. However, problems may occur, including:  Getting too much medicine (oversedation).  Nausea.  Allergic reaction to medicines.  Trouble breathing. If this happens, a breathing tube may be used to help with breathing. It will be removed when you are awake and breathing on your own.  Heart trouble.  Lung trouble. Before the procedure Staying hydrated Follow instructions from your health care provider about hydration, which may include:  Up to 2 hours before the procedure - you may continue to drink clear liquids, such as water, clear fruit juice, black coffee, and plain tea. Eating and drinking restrictions Follow instructions from your health care provider about eating and drinking, which may include:  8 hours before the procedure - stop eating heavy meals or foods such as meat, fried foods, or fatty foods.  6 hours before the procedure - stop eating light meals or foods, such as toast or cereal.  6 hours before the procedure - stop drinking milk or drinks that contain milk.  2 hours before the procedure - stop drinking clear liquids. Medicines Ask your health care provider about:  Changing or stopping your regular medicines. This is especially important if you are taking diabetes medicines or blood thinners.  Taking medicines such as aspirin  and ibuprofen. These medicines can thin your blood. Do not take these medicines before your procedure if your health care provider instructs you not to. Tests and exams  You will have a physical exam.  You may have blood tests done to show: ? How well your kidneys and liver are working. ? How well your blood can clot. General instructions  Plan to have someone take you home from the hospital or clinic.  If you will be going home right after the procedure, plan to have someone with you for 24 hours.  What happens during the procedure?  Your blood pressure, heart rate, breathing, level of pain and overall condition will be monitored.  An IV tube will be inserted into one of your veins.  Your anesthesia specialist will give you medicines as needed to keep you comfortable during the procedure. This may mean changing the level of sedation.  The procedure will be performed. After the procedure  Your blood pressure, heart rate, breathing rate, and blood oxygen level will be monitored until the medicines you were given have worn off.  Do not drive for 24 hours if you received a sedative.  You may: ? Feel sleepy, clumsy, or nauseous. ? Feel forgetful about what happened after the procedure. ? Have a sore throat if you had a breathing tube during the procedure. ? Vomit. This information is not intended to replace advice given to you by your health care provider. Make sure you discuss any questions you have with your health care provider. Document Released: 05/30/2005 Document Revised: 02/10/2016 Document Reviewed: 12/25/2015 Elsevier Interactive Patient Education  2019 Elsevier Inc. Monitored Anesthesia Care, Care After These instructions provide you with information about caring for yourself after your procedure. Your health care provider may also give you more specific instructions. Your  treatment has been planned according to current medical practices, but problems sometimes occur.  Call your health care provider if you have any problems or questions after your procedure. What can I expect after the procedure? After your procedure, you may:  Feel sleepy for several hours.  Feel clumsy and have poor balance for several hours.  Feel forgetful about what happened after the procedure.  Have poor judgment for several hours.  Feel nauseous or vomit.  Have a sore throat if you had a breathing tube during the procedure. Follow these instructions at home: For at least 24 hours after the procedure:      Have a responsible adult stay with you. It is important to have someone help care for you until you are awake and alert.  Rest as needed.  Do not: ? Participate in activities in which you could fall or become injured. ? Drive. ? Use heavy machinery. ? Drink alcohol. ? Take sleeping pills or medicines that cause drowsiness. ? Make important decisions or sign legal documents. ? Take care of children on your own. Eating and drinking  Follow the diet that is recommended by your health care provider.  If you vomit, drink water, juice, or soup when you can drink without vomiting.  Make sure you have little or no nausea before eating solid foods. General instructions  Take over-the-counter and prescription medicines only as told by your health care provider.  If you have sleep apnea, surgery and certain medicines can increase your risk for breathing problems. Follow instructions from your health care provider about wearing your sleep device: ? Anytime you are sleeping, including during daytime naps. ? While taking prescription pain medicines, sleeping medicines, or medicines that make you drowsy.  If you smoke, do not smoke without supervision.  Keep all follow-up visits as told by your health care provider. This is important. Contact a health care provider if:  You keep feeling nauseous or you keep vomiting.  You feel light-headed.  You develop a  rash.  You have a fever. Get help right away if:  You have trouble breathing. Summary  For several hours after your procedure, you may feel sleepy and have poor judgment.  Have a responsible adult stay with you for at least 24 hours or until you are awake and alert. This information is not intended to replace advice given to you by your health care provider. Make sure you discuss any questions you have with your health care provider. Document Released: 12/25/2015 Document Revised: 04/19/2017 Document Reviewed: 12/25/2015 Elsevier Interactive Patient Education  2019 [employer].

## 2018-10-13 ENCOUNTER — Ambulatory Visit (HOSPITAL_COMMUNITY)
Admission: RE | Admit: 2018-10-13 | Discharge: 2018-10-13 | Disposition: A | Discharge status: Home / Self Care | Payer: Medicare Other | Source: Ambulatory Visit | Attending: Internal Medicine | Admitting: Internal Medicine

## 2018-10-13 ENCOUNTER — Encounter (HOSPITAL_COMMUNITY): Payer: Self-pay

## 2018-10-13 ENCOUNTER — Encounter: Payer: Self-pay | Admitting: Cardiology

## 2018-10-13 ENCOUNTER — Telehealth: Payer: Self-pay

## 2018-10-13 NOTE — Telephone Encounter (Signed)
Spoke with patient to remind of missed remote transmission 

## 2018-10-14 ENCOUNTER — Ambulatory Visit: Payer: Medicare Other | Admitting: Orthopaedic Surgery

## 2018-10-14 ENCOUNTER — Ambulatory Visit (INDEPENDENT_AMBULATORY_CARE_PROVIDER_SITE_OTHER): Payer: Medicare Other

## 2018-10-14 ENCOUNTER — Encounter: Payer: Self-pay | Admitting: Orthopaedic Surgery

## 2018-10-14 VITALS — BP 116/66 | HR 64 | Ht 66.0 in | Wt 172.0 lb

## 2018-10-14 DIAGNOSIS — M25531 Pain in right wrist: Secondary | ICD-10-CM

## 2018-10-14 DIAGNOSIS — M25522 Pain in left elbow: Secondary | ICD-10-CM | POA: Diagnosis not present

## 2018-10-14 DIAGNOSIS — Z9981 Dependence on supplemental oxygen: Secondary | ICD-10-CM

## 2018-10-14 DIAGNOSIS — M25561 Pain in right knee: Secondary | ICD-10-CM | POA: Diagnosis not present

## 2018-10-14 DIAGNOSIS — M25562 Pain in left knee: Secondary | ICD-10-CM | POA: Diagnosis not present

## 2018-10-14 DIAGNOSIS — Z7901 Long term (current) use of anticoagulants: Secondary | ICD-10-CM

## 2018-10-14 NOTE — Progress Notes (Signed)
Patient ZO:XWRUE:Tracey Dudley, female DOB:[birth year], 73 y.o. AVW:[id number]  Chief Complaint  Patient presents with  . Knee Injury    Bilateral knee pain, DOI 10-08-18 had a fall.    HPI  Tracey Dudley is a 73 y.o. female who fell on 10-06-2018 and hurt her left knee, right knee, right wrist, left elbow and left chest.  She tripped over a concrete piece in the parking lot that prevents cars from moving forward.  She did not seek medical attention at that time.  She went home and used ice.  She has had large ecchymosis areas on both knees, the hand on the right and left elbow.  She has pain in the left lateral lower chest she feels is related to her having the oxygen generator she uses over her left arm at the time of the fall.    She says her chest and hand are better.  Both knees are tender as is her elbow.  She had no head injury.   Body mass index is 27.76 kg/m.  ROS  Review of Systems  Constitutional: Positive for activity change.  Respiratory: Positive for shortness of breath (oxygen dependent).   Cardiovascular: Positive for palpitations.  Musculoskeletal: Positive for arthralgias, gait problem and joint swelling.  Psychiatric/Behavioral: The patient is nervous/anxious.   All other systems reviewed and are negative.   All other systems reviewed and are negative.  The following is a summary of the past history medically, past history surgically, known current medicines, social history and family history.  This information is gathered electronically by the computer from prior information and documentation.  I review this each visit and have found including this information at this point in the chart is beneficial and informative.    Past Medical History:  Diagnosis Date  . Anxiety   . Atrial fibrillation (HCC)   . COPD (chronic obstructive pulmonary disease) (HCC)   . Essential hypertension   . Herpes zoster   . Hyperlipidemia   . PAF (paroxysmal atrial fibrillation) (HCC)    Followed by Dr. Leonie GreenLingle in [place], also [facility] EP assessment by Dr. Christin FudgeHegland  . 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 Vitamin D deficiency     Past Surgical History:  Procedure Laterality Date  . ABDOMINAL HYSTERECTOMY    . ABLATION OF DYSRHYTHMIC FOCUS  01/02/2018  . AV NODE ABLATION N/A 01/02/2018   Procedure: AV NODE ABLATION;  Surgeon: Marinus Mawaylor, Gregg W, MD;  [facility];  Service: Cardiovascular;  Laterality: N/A;  . CATARACT EXTRACTION    . INSERT / REPLACE / REMOVE PACEMAKER  01/02/2018   DUAL CHAMBER  . PACEMAKER IMPLANT N/A 01/02/2018   Procedure: PACEMAKER IMPLANT - Dual Chamber;  Surgeon: Marinus Mawaylor, Gregg W, MD;  Location: [facility] INVASIVE CV LAB;  Service: Cardiovascular;  Laterality: N/A;    Family History  Problem Relation Age of Onset  . Breast cancer Sister   . CAD Father   . Asthma Mother     Social History Social History   Tobacco Use  . Smoking status: Former Smoker    Packs/day: 0.50    Years: 30.00    Pack years: 15.00    Types: Cigarettes    Start date: 11/03/1977    Last attempt to quit: 06/01/2014    Years since quitting: 4.3  . Smokeless tobacco: Never Used  Substance Use Topics  . Alcohol use: No    Alcohol/week: 0.0 standard drinks  . Drug  use: No    Allergies  Allergen Reactions  . Metoprolol Shortness Of Breath and Other (See Comments)    Tiredness  . Erythromycin Nausea Only    Current Outpatient Medications  Medication Sig Dispense Refill  . acetaminophen (TYLENOL) 500 MG tablet Take 500 mg by mouth every 6 (six) hours as needed for moderate pain or headache.     . albuterol (PROVENTIL HFA;VENTOLIN HFA) 108 (90 BASE) MCG/ACT inhaler Inhale 1 puff into the lungs every 6 (six) hours as needed for wheezing or shortness of breath.    . ALPRAZolam (XANAX) 0.5 MG tablet Take 0.25-1 mg by mouth 3 (three) times daily as needed for anxiety.     Marland Kitchen atorvastatin (LIPITOR) 20 MG tablet Take 20 mg by mouth every  evening.     . Azilsartan Medoxomil (EDARBI) 80 MG TABS TAKE 80 MG BY MOUTH EVERY DAY 90 tablet 3  . diltiazem (CARDIZEM CD) 120 MG 24 hr capsule Take 1 capsule (120 mg total) by mouth 2 (two) times daily. 180 capsule 3  . dofetilide (TIKOSYN) 250 MCG capsule TAKE ONE CAPSULE BY MOUTH EVERY 12 HOURS 180 capsule 3  . Nebivolol HCl (BYSTOLIC) 20 MG TABS TAKE 20 MG BY MOUTH EVERY DAY 90 tablet 2  . OXYGEN Inhale 2 L into the lungs continuous.    Marland Kitchen spironolactone (ALDACTONE) 25 MG tablet TAKE 1/2 TABLET (12.5MG ) BY MOUTH EVERY DAY 135 tablet 2  . TRELEGY ELLIPTA 100-62.5-25 MCG/INH AEPB INHALE 1 PUFF BY MOUTH EVERY DAY  0  . XARELTO 20 MG TABS tablet TAKE 1 TABLET BY MOUTH WITH FOOD EVERY DAY 90 tablet 2   No current facility-administered medications for this visit.      Physical Exam  Blood pressure 116/66, pulse 64, height 5\' 6"  (1.676 m), weight 172 lb (78 kg).  Constitutional: overall normal hygiene, normal nutrition, well developed, normal grooming, normal body habitus. Assistive device:supplemental oxygen generator  Musculoskeletal: gait and station Limp right, muscle tone and strength are normal, no tremors or atrophy is present.  .  Neurological: coordination overall normal.  Deep tendon reflex/nerve stretch intact.  Sensation normal.  Cranial nerves II-XII intact.   Skin:   Normal overall no scars, lesions.  She has large ecchymosis of both knees just below the joint anteriorly.  She has ecchymosis of the right volar wrist and hand.  She has ecchymosis of the left elbow.  Psychiatric: Alert and oriented x 3.  Recent memory intact, remote memory unclear.  Normal mood and affect. Well groomed.  Good eye contact.  Cardiovascular: overall no swelling, no varicosities, no edema bilaterally, normal temperatures of the legs and arms, no clubbing, cyanosis and good capillary refill.  Lymphatic: palpation is normal.  Both knees are tender, anteriorly with large areas of  hematoma/ecchymosis.  NV intact.  She has no wound or draining area.  She has ROM right of 0 to 100, left 0 to 110.  The right wrist is tender with near full ROM and ecchymosis present more volarly.  NV intact.  The left elbow has small abrasion and ecchymosis with no wound draining.  ROM is full but tender.  All other systems reviewed and are negative   The patient has been educated about the nature of the problem(s) and counseled on treatment options.  The patient appeared to understand what I have discussed and is in agreement with it.  Encounter Diagnoses  Name Primary?  . Wrist pain, acute, right Yes  . Elbow pain, left   .  Acute pain of both knees   . Supplemental oxygen dependent   . Anticoagulated    X-rays were done of the right wrist, left elbow, both knees.  Reported separately.  No fractures noted.  PLAN Call if any problems.  Precautions discussed.  Continue current medications.   Return to clinic 2 weeks   Electronically Signed Darreld Mclean, MD 1/28/20203:24 PM

## 2018-10-17 ENCOUNTER — Encounter (HOSPITAL_COMMUNITY): Admission: RE | Payer: Self-pay | Source: Home / Self Care

## 2018-10-17 ENCOUNTER — Ambulatory Visit (HOSPITAL_COMMUNITY): Admission: RE | Admit: 2018-10-17 | Payer: Medicare Other | Source: Home / Self Care | Admitting: Internal Medicine

## 2018-10-17 SURGERY — COLONOSCOPY WITH PROPOFOL
Anesthesia: Monitor Anesthesia Care

## 2018-10-28 ENCOUNTER — Ambulatory Visit: Payer: Medicare Other | Admitting: Orthopaedic Surgery

## 2018-10-28 ENCOUNTER — Encounter: Payer: Self-pay | Admitting: Orthopaedic Surgery

## 2018-10-28 VITALS — BP 126/73 | HR 84 | Ht 66.0 in | Wt 178.0 lb

## 2018-10-28 DIAGNOSIS — Z9981 Dependence on supplemental oxygen: Secondary | ICD-10-CM | POA: Diagnosis not present

## 2018-10-28 DIAGNOSIS — Z7901 Long term (current) use of anticoagulants: Secondary | ICD-10-CM

## 2018-10-28 DIAGNOSIS — M25522 Pain in left elbow: Secondary | ICD-10-CM

## 2018-10-28 DIAGNOSIS — M25562 Pain in left knee: Secondary | ICD-10-CM

## 2018-10-28 DIAGNOSIS — M25561 Pain in right knee: Secondary | ICD-10-CM

## 2018-10-28 DIAGNOSIS — M25531 Pain in right wrist: Secondary | ICD-10-CM

## 2018-10-28 NOTE — Progress Notes (Signed)
Patient HQ:IONGE Tracey Dudley, female DOB:1945/10/14, 73 y.o. XBM:[id number]  Chief Complaint  Patient presents with  . Leg Pain    Bilateral and knee    HPI  Tracey Dudley is a 73 y.o. female who fell several weeks ago and had multiple joint injuries.  She is on anticoagulation and on supplemental oxygen. She is better.  Her knees and wrists have resolving ecchymosis. The left elbow abrasion is healing.  She is walking better.  She has soreness now but no pain.   Body mass index is 28.73 kg/m.  ROS  Review of Systems  Constitutional: Positive for activity change.  Respiratory: Positive for shortness of breath (oxygen dependent).   Cardiovascular: Positive for palpitations.  Musculoskeletal: Positive for arthralgias, gait problem and joint swelling.  Psychiatric/Behavioral: The patient is nervous/anxious.   All other systems reviewed and are negative.   All other systems reviewed and are negative.  The following is a summary of the past history medically, past history surgically, known current medicines, social history and family history.  This information is gathered electronically by the computer from prior information and documentation.  I review this each visit and have found including this information at this point in the chart is beneficial and informative.    Past Medical History:  Diagnosis Date  . Anxiety   . Atrial fibrillation (HCC)   . COPD (chronic obstructive pulmonary disease) (HCC)   . Essential hypertension   . Herpes zoster   . Hyperlipidemia   . PAF (paroxysmal atrial fibrillation) (HCC)    Followed by Dr. Leonie Green in [place], also [facility] EP assessment by Dr. Christin Fudge  . 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 Vitamin D deficiency     Past Surgical History:  Procedure Laterality Date  . ABDOMINAL HYSTERECTOMY    . ABLATION OF DYSRHYTHMIC FOCUS  01/02/2018  . AV NODE ABLATION N/A 01/02/2018   Procedure: AV NODE ABLATION;  Surgeon:  Marinus Maw, MD;  Location: [facility] INVASIVE CV LAB;  Service: Cardiovascular;  Laterality: N/A;  . CATARACT EXTRACTION    . INSERT / REPLACE / REMOVE PACEMAKER  01/02/2018   DUAL CHAMBER  . PACEMAKER IMPLANT N/A 01/02/2018   Procedure: PACEMAKER IMPLANT - Dual Chamber;  Surgeon: Marinus Maw, MD;  Location: [facility] INVASIVE CV LAB;  Service: Cardiovascular;  Laterality: N/A;    Family History  Problem Relation Age of Onset  . Breast cancer Sister   . CAD Father   . Asthma Mother     Social History Social History   Tobacco Use  . Smoking status: Former Smoker    Packs/day: 0.50    Years: 30.00    Pack years: 15.00    Types: Cigarettes    Start date: 11/03/1977    Last attempt to quit: 06/01/2014    Years since quitting: 4.4  . Smokeless tobacco: Never Used  Substance Use Topics  . Alcohol use: No    Alcohol/week: 0.0 standard drinks  . Drug use: No    Allergies  Allergen Reactions  . Metoprolol Shortness Of Breath and Other (See Comments)    Tiredness  . Erythromycin Nausea Only    Current Outpatient Medications  Medication Sig Dispense Refill  . acetaminophen (TYLENOL) 500 MG tablet Take 500 mg by mouth every 6 (six) hours as needed for moderate pain or headache.     . albuterol (PROVENTIL HFA;VENTOLIN HFA) 108 (90 BASE) MCG/ACT inhaler Inhale 1 puff into the lungs every  6 (six) hours as needed for wheezing or shortness of breath.    . ALPRAZolam (XANAX) 0.5 MG tablet Take 0.25-1 mg by mouth 3 (three) times daily as needed for anxiety.     Marland Kitchen. atorvastatin (LIPITOR) 20 MG tablet Take 20 mg by mouth every evening.     . Azilsartan Medoxomil (EDARBI) 80 MG TABS TAKE 80 MG BY MOUTH EVERY DAY 90 tablet 3  . diltiazem (CARDIZEM CD) 120 MG 24 hr capsule Take 1 capsule (120 mg total) by mouth 2 (two) times daily. 180 capsule 3  . dofetilide (TIKOSYN) 250 MCG capsule TAKE ONE CAPSULE BY MOUTH EVERY 12 HOURS 180 capsule 3  . Nebivolol HCl (BYSTOLIC) 20 MG TABS TAKE 20 MG BY MOUTH  EVERY DAY 90 tablet 2  . OXYGEN Inhale 2 L into the lungs continuous.    Marland Kitchen. spironolactone (ALDACTONE) 25 MG tablet TAKE 1/2 TABLET (12.5MG ) BY MOUTH EVERY DAY 135 tablet 2  . TRELEGY ELLIPTA 100-62.5-25 MCG/INH AEPB INHALE 1 PUFF BY MOUTH EVERY DAY  0  . XARELTO 20 MG TABS tablet TAKE 1 TABLET BY MOUTH WITH FOOD EVERY DAY 90 tablet 2   No current facility-administered medications for this visit.      Physical Exam  Blood pressure 126/73, pulse 84, height 5\' 6"  (1.676 m), weight 178 lb (80.7 kg).  Constitutional: overall normal hygiene, normal nutrition, well developed, normal grooming, normal body habitus. Assistive device:supplemental oxygen  Musculoskeletal: gait and station Limp none, muscle tone and strength are normal, no tremors or atrophy is present.  .  Neurological: coordination overall normal.  Deep tendon reflex/nerve stretch intact.  Sensation normal.  Cranial nerves II-XII intact.   Skin:   Normal overall no scars, lesions, ulcers or rashes. No psoriasis.  Psychiatric: Alert and oriented x 3.  Recent memory intact, remote memory unclear.  Normal mood and affect. Well groomed.  Good eye contact.  Cardiovascular: overall no swelling, no varicosities, no edema bilaterally, normal temperatures of the legs and arms, no clubbing, cyanosis and good capillary refill.  Lymphatic: palpation is normal.  She resolving ecchymosis of both knees with mild effusions, ROM 0 to 105.  NV intact.  She has resolving ecchymosis of the right wrist with full motion.  The left elbow has resolving abrasion with full motion.  All other systems reviewed and are negative   The patient has been educated about the nature of the problem(s) and counseled on treatment options.  The patient appeared to understand what I have discussed and is in agreement with it.  Encounter Diagnoses  Name Primary?  . Wrist pain, acute, right Yes  . Elbow pain, left   . Acute pain of both knees   . Supplemental  oxygen dependent   . Anticoagulated     PLAN Call if any problems.  Precautions discussed.  Continue current medications.   Return to clinic prn   Electronically Signed Darreld McleanWayne Azizah Lisle, MD 2/11/20201:59 PM

## 2018-11-11 ENCOUNTER — Ambulatory Visit (INDEPENDENT_AMBULATORY_CARE_PROVIDER_SITE_OTHER): Payer: Medicare Other | Admitting: *Deleted

## 2018-11-11 DIAGNOSIS — I480 Paroxysmal atrial fibrillation: Secondary | ICD-10-CM | POA: Diagnosis not present

## 2018-11-12 ENCOUNTER — Encounter (INDEPENDENT_AMBULATORY_CARE_PROVIDER_SITE_OTHER): Payer: Self-pay | Admitting: *Deleted

## 2018-11-12 ENCOUNTER — Telehealth (INDEPENDENT_AMBULATORY_CARE_PROVIDER_SITE_OTHER): Payer: Self-pay | Admitting: *Deleted

## 2018-11-12 ENCOUNTER — Other Ambulatory Visit (INDEPENDENT_AMBULATORY_CARE_PROVIDER_SITE_OTHER): Payer: Self-pay | Admitting: *Deleted

## 2018-11-12 DIAGNOSIS — Z1211 Encounter for screening for malignant neoplasm of colon: Secondary | ICD-10-CM

## 2018-11-12 NOTE — Telephone Encounter (Signed)
Patient is scheduled for colonoscopy 12/12/18 -- she needs to stop Xarelto 2 days before procedure -- please advise if ok to stop

## 2018-11-13 ENCOUNTER — Telehealth (INDEPENDENT_AMBULATORY_CARE_PROVIDER_SITE_OTHER): Payer: Self-pay | Admitting: *Deleted

## 2018-11-13 LAB — CUP PACEART REMOTE DEVICE CHECK
Battery Remaining Longevity: 155 mo
Battery Voltage: 3.04 V
Brady Statistic AP VP Percent: 0 %
Brady Statistic AP VS Percent: 0 %
Brady Statistic AS VP Percent: 0 %
Brady Statistic AS VS Percent: 0 %
Brady Statistic RA Percent Paced: 0.26 %
Brady Statistic RV Percent Paced: 40.76 %
Date Time Interrogation Session: 20200225150240
Implantable Lead Implant Date: 20190418
Implantable Lead Location: 753859
Implantable Lead Location: 753860
Implantable Lead Model: 3830
Implantable Lead Model: 5076
Implantable Pulse Generator Implant Date: 20190418
Lead Channel Impedance Value: 304 Ohm
Lead Channel Impedance Value: 399 Ohm
Lead Channel Impedance Value: 437 Ohm
Lead Channel Pacing Threshold Amplitude: 0.625 V
Lead Channel Pacing Threshold Amplitude: 1.75 V
Lead Channel Pacing Threshold Pulse Width: 0.4 ms
Lead Channel Pacing Threshold Pulse Width: 0.4 ms
Lead Channel Sensing Intrinsic Amplitude: 2.375 mV
Lead Channel Sensing Intrinsic Amplitude: 2.375 mV
Lead Channel Sensing Intrinsic Amplitude: 3.75 mV
Lead Channel Sensing Intrinsic Amplitude: 3.75 mV
Lead Channel Setting Pacing Amplitude: 2 V
Lead Channel Setting Pacing Amplitude: 2.5 V
Lead Channel Setting Pacing Pulse Width: 1 ms
Lead Channel Setting Sensing Sensitivity: 1.2 mV
MDC IDC LEAD IMPLANT DT: 20190418
MDC IDC MSMT LEADCHNL RA IMPEDANCE VALUE: 304 Ohm

## 2018-11-13 NOTE — Telephone Encounter (Signed)
Referring MD/PCP: rachel mcgee   Procedure: tcs w propofol  Reason/Indication:  screening  Has patient had this procedure before?  Yes, 10 yrs ago             If so, when, by whom and where?    Is there a family history of colon cancer?  no             Who?  What age when diagnosed?    Is patient diabetic?   no                                                  Does patient have prosthetic heart valve or mechanical valve?  no  Do you have a pacemaker?  yesno  Has patient ever had endocarditis? no  Has patient had joint replacement within last 12 months?  no  Is patient constipated or do they take laxatives? no  Does patient have a history of alcohol/drug use?  no  Is patient on blood thinner such as Coumadin, Plavix and/or Aspirin? yes  Medications: see epic  Allergies: see epic  Medication Adjustment per Dr Keane Police, NP: xarelto 2 days  Procedure date & time: 12/12/18 at 830 Pre-op 3/24 at 1:15

## 2018-11-13 NOTE — Telephone Encounter (Signed)
I will forward to Luster Landsberg, Coordinator

## 2018-11-13 NOTE — Telephone Encounter (Signed)
Forwarded to Dr.McDowell for dispo.

## 2018-11-13 NOTE — Telephone Encounter (Signed)
Yes, reasonable to hold Xarelto 48 hours prior to colonoscopy.

## 2018-11-13 NOTE — Telephone Encounter (Signed)
Patient aware.

## 2018-11-17 NOTE — Telephone Encounter (Signed)
agree

## 2018-11-19 ENCOUNTER — Encounter: Payer: Self-pay | Admitting: Cardiology

## 2018-11-19 NOTE — Progress Notes (Signed)
Remote pacemaker transmission.   

## 2018-12-09 ENCOUNTER — Other Ambulatory Visit (HOSPITAL_COMMUNITY): Payer: Medicare Other

## 2018-12-12 ENCOUNTER — Ambulatory Visit (HOSPITAL_COMMUNITY): Admission: RE | Admit: 2018-12-12 | Payer: Medicare Other | Source: Home / Self Care | Admitting: Internal Medicine

## 2018-12-12 ENCOUNTER — Encounter (HOSPITAL_COMMUNITY): Admission: RE | Payer: Self-pay | Source: Home / Self Care

## 2018-12-12 SURGERY — COLONOSCOPY WITH PROPOFOL
Anesthesia: Monitor Anesthesia Care

## 2018-12-23 ENCOUNTER — Telehealth (INDEPENDENT_AMBULATORY_CARE_PROVIDER_SITE_OTHER): Payer: Medicare Other | Admitting: Internal Medicine

## 2018-12-23 VITALS — BP 114/53 | HR 82

## 2018-12-23 DIAGNOSIS — I110 Hypertensive heart disease with heart failure: Secondary | ICD-10-CM | POA: Diagnosis not present

## 2018-12-23 DIAGNOSIS — I4819 Other persistent atrial fibrillation: Secondary | ICD-10-CM

## 2018-12-23 DIAGNOSIS — I10 Essential (primary) hypertension: Secondary | ICD-10-CM

## 2018-12-23 DIAGNOSIS — Z950 Presence of cardiac pacemaker: Secondary | ICD-10-CM

## 2018-12-23 DIAGNOSIS — I5032 Chronic diastolic (congestive) heart failure: Secondary | ICD-10-CM

## 2018-12-23 NOTE — Patient Instructions (Signed)
Medication Instructions:  Your physician has recommended you make the following change in your medication:   Stop Taking Dofetilide (Tikosyn)  Labwork: NONE  Testing/Procedures: NONE  Follow-Up: Your physician recommends that you schedule a follow-up appointment in: August with Dr. Christene Slates   Any Other Special Instructions Will Be Listed Below (If Applicable).     If you need a refill on your cardiac medications before your next appointment, please call your pharmacy.  Thank you for choosing [facility]!

## 2018-12-23 NOTE — Progress Notes (Signed)
Electrophysiology TeleHealth Note   Due to national recommendations of social distancing due to COVID 19, an audio/video telehealth visit is felt to be most appropriate for this patient at this time.  See MyChart message from today for the patient's consent to telehealth for Tracey Dudley.   Date:  12/23/2018   ID:  Tracey Dudley, DOB 01-Mar-1946, MRN [id number]  Location: patient's home  Provider location: [address], [place]  Evaluation Performed: Follow-up visit  PCP:  Jonathon Bellows, DO  Cardiologist:  Nona Dell, MD  Electrophysiologist:  Dr Ladona Ridgel  Chief Complaint:  " I am not as tired"  History of Present Illness:    Tracey Dudley is a 73 y.o. female who presents via audio/video conferencing for a telehealth visit today. She was unable to perform the video portion due to technical diificulties.  Since last being seen in our clinic, the patient reports doing very well.  Today, she denies symptoms of palpitations, chest pain, shortness of breath,  lower extremity edema except for mild swelling in the left foot, dizziness, presyncope, or syncope.  The patient is otherwise without complaint today.  The patient denies symptoms of fevers, chills, cough, or new SOB worrisome for COVID 19.  Past Medical History:  Diagnosis Date  . Anxiety   . Atrial fibrillation (HCC)   . COPD (chronic obstructive pulmonary disease) (HCC)   . Essential hypertension   . Herpes zoster   . Hyperlipidemia   . PAF (paroxysmal atrial fibrillation) (HCC)    Followed by Dr. Leonie Green in [place], also [facility] EP assessment by Dr. Christin Fudge  . 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 Vitamin D deficiency     Past Surgical History:  Procedure Laterality Date  . ABDOMINAL HYSTERECTOMY    . ABLATION OF DYSRHYTHMIC FOCUS  01/02/2018  . AV NODE ABLATION N/A 01/02/2018   Procedure: AV NODE ABLATION;  Surgeon: Marinus Maw, MD;  Location: [facility] INVASIVE CV LAB;   Service: Cardiovascular;  Laterality: N/A;  . CATARACT EXTRACTION    . INSERT / REPLACE / REMOVE PACEMAKER  01/02/2018   DUAL CHAMBER  . PACEMAKER IMPLANT N/A 01/02/2018   Procedure: PACEMAKER IMPLANT - Dual Chamber;  Surgeon: Marinus Maw, MD;  Location: [facility] INVASIVE CV LAB;  Service: Cardiovascular;  Laterality: N/A;    Current Outpatient Medications  Medication Sig Dispense Refill  . acetaminophen (TYLENOL) 500 MG tablet Take 500 mg by mouth every 6 (six) hours as needed for moderate pain or headache.     . albuterol (PROVENTIL HFA;VENTOLIN HFA) 108 (90 BASE) MCG/ACT inhaler Inhale 1 puff into the lungs every 6 (six) hours as needed for wheezing or shortness of breath.    . ALPRAZolam (XANAX) 0.5 MG tablet Take 0.25-1 mg by mouth 3 (three) times daily as needed for anxiety.     Marland Kitchen atorvastatin (LIPITOR) 20 MG tablet Take 20 mg by mouth every evening.     . Azilsartan Medoxomil (EDARBI) 80 MG TABS TAKE 80 MG BY MOUTH EVERY DAY 90 tablet 3  . diltiazem (CARDIZEM CD) 120 MG 24 hr capsule Take 1 capsule (120 mg total) by mouth 2 (two) times daily. 180 capsule 3  . dofetilide (TIKOSYN) 250 MCG capsule TAKE ONE CAPSULE BY MOUTH EVERY 12 HOURS 180 capsule 3  . halobetasol (ULTRAVATE) 0.05 % ointment APPLY TO AFFECTED AREA EVERY DAY  6  . Nebivolol HCl (BYSTOLIC) 20 MG TABS TAKE 20 MG BY  MOUTH EVERY DAY 90 tablet 2  . OXYGEN Inhale 2 L into the lungs continuous.    Marland Kitchen. spironolactone (ALDACTONE) 25 MG tablet TAKE 1/2 TABLET (12.5MG ) BY MOUTH EVERY DAY 135 tablet 2  . TRELEGY ELLIPTA 100-62.5-25 MCG/INH AEPB INHALE 1 PUFF BY MOUTH EVERY DAY  0  . triamcinolone cream (KENALOG) 0.1 %     . XARELTO 20 MG TABS tablet TAKE 1 TABLET BY MOUTH WITH FOOD EVERY DAY 90 tablet 2   No current facility-administered medications for this visit.     Allergies:   Metoprolol and Erythromycin   Social History:  The patient  reports that she quit smoking about 4 years ago. Her smoking use included cigarettes. She  started smoking about 41 years ago. She has a 15.00 pack-year smoking history. She has never used smokeless tobacco. She reports that she does not drink alcohol or use drugs.   Family History:  The patient's  family history includes Asthma in her mother; Breast cancer in her sister; CAD in her father.   ROS:  Please see the history of present illness.   All other systems are personally reviewed and negative.    Exam:    Vital Signs:  BP (!) 114/53   Pulse 82    Labs/Other Tests and Data Reviewed:    Recent Labs: 12/25/2017: BUN 19; Creatinine, Ser 1.26; Hemoglobin 11.7; Platelets 191; Potassium 4.7; Sodium 132   Wt Readings from Last 3 Encounters:  10/28/18 178 lb (80.7 kg)  10/14/18 172 lb (78 kg)  10/01/18 172 lb (78 kg)     Other studies personally reviewed:  Last device remote is reviewed from [employer] PDF dated 11/13/2018 which reveals normal device function, and atrial fib   ASSESSMENT & PLAN:    1.  Atrial fib - her rates are controlled nicely. She is not maintaining NSR after her URI from 2 months ago. She will stop her dofetilide. 2. Chronic diastolic heart failure - with rate control, she remains class 2. She is encouraged to maintain a low sodium diet.  3. HTN - her blood pressures haven been good. No change in meds. 4. PPM - her interogation from 2 months ago demonstrates normal device function.  5. COVID 19 screen The patient denies symptoms of COVID 19 at this time.  The importance of social distancing was discussed today.  Follow-up:  August 2020 Next remote: 5/20  Current medicines are reviewed at length with the patient today.   The patient does not have concerns regarding her medicines.  The following changes were made today:  none  Labs/ tests ordered today include: none No orders of the defined types were placed in this encounter.    Patient Risk:  after full review of this patients clinical status, I feel that they are at moderate risk at this time.   Today, I have spent 25 minutes with the patient with telehealth technology discussing all of the above .    Signed, Lewayne BuntingGregg Kasey Ewings, MD  12/23/2018 10:09 AM     [facility] [address] [place] [address] [phone] (office) [phone] (fax)

## 2019-01-27 ENCOUNTER — Ambulatory Visit: Payer: Medicare Other | Admitting: Cardiology

## 2019-02-10 ENCOUNTER — Ambulatory Visit (INDEPENDENT_AMBULATORY_CARE_PROVIDER_SITE_OTHER): Payer: Medicare Other | Admitting: *Deleted

## 2019-02-10 DIAGNOSIS — I4819 Other persistent atrial fibrillation: Secondary | ICD-10-CM | POA: Diagnosis not present

## 2019-02-11 LAB — CUP PACEART REMOTE DEVICE CHECK
Date Time Interrogation Session: 20200527120029
Implantable Lead Implant Date: 20190418
Implantable Lead Implant Date: 20190418
Implantable Lead Location: 753859
Implantable Lead Location: 753860
Implantable Lead Model: 3830
Implantable Lead Model: 5076
Implantable Pulse Generator Implant Date: 20190418

## 2019-02-20 NOTE — Progress Notes (Signed)
Remote pacemaker transmission.   

## 2019-03-11 ENCOUNTER — Other Ambulatory Visit: Payer: Self-pay | Admitting: Cardiology

## 2019-04-01 ENCOUNTER — Other Ambulatory Visit: Payer: Self-pay | Admitting: *Deleted

## 2019-04-01 MED ORDER — EDARBI 80 MG PO TABS: ORAL_TABLET | ORAL | 3 refills | Status: DC

## 2019-04-13 NOTE — Progress Notes (Deleted)
Cardiology Office Note  Date: 04/13/2019   ID: Tracey Dudley, DOB 1946/04/25, MRN [id number]  PCP:  Tracey Gilles, DO  Cardiologist:  Tracey Lesches, MD Electrophysiologist:  Tracey Peru, MD   No chief complaint on file.   History of Present Illness: Tracey Dudley is a 73 y.o. female last seen in December 2019.  She sees Dr. Lovena Le with a history of incomplete AV node ablation with placement of Medtronic pacemaker and His bundle lead.  She had a telehealth encounter with Dr. Lovena Le in April.  She was taken off dofetilide at that time given persistent atrial fibrillation.  Past Medical History:  Diagnosis Date  . Anxiety   . Atrial fibrillation ([place])   . COPD (chronic obstructive pulmonary disease) ([place])   . Essential hypertension   . Herpes zoster   . Hyperlipidemia   . PAF (paroxysmal atrial fibrillation) (HCC)    Followed by Dr. Gibson Ramp in [place], also [facility] EP assessment by Dr. Westley Gambles  . 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 Vitamin D deficiency     Past Surgical History:  Procedure Laterality Date  . ABDOMINAL HYSTERECTOMY    . ABLATION OF DYSRHYTHMIC FOCUS  01/02/2018  . AV NODE ABLATION N/A 01/02/2018   Procedure: AV NODE ABLATION;  Surgeon: Evans Lance, MD;  Location: [facility] CV LAB;  Service: Cardiovascular;  Laterality: N/A;  . CATARACT EXTRACTION    . INSERT / REPLACE / REMOVE PACEMAKER  01/02/2018   DUAL CHAMBER  . PACEMAKER IMPLANT N/A 01/02/2018   Procedure: PACEMAKER IMPLANT - Dual Chamber;  Surgeon: Evans Lance, MD;  Location: [facility] CV LAB;  Service: Cardiovascular;  Laterality: N/A;    Current Outpatient Medications  Medication Sig Dispense Refill  . acetaminophen (TYLENOL) 500 MG tablet Take 500 mg by mouth every 6 (six) hours as needed for moderate pain or headache.     . albuterol (PROVENTIL HFA;VENTOLIN HFA) 108 (90 BASE) MCG/ACT inhaler Inhale 1 puff into the lungs every 6 (six) hours as needed  for wheezing or shortness of breath.    . ALPRAZolam (XANAX) 0.5 MG tablet Take 0.25-1 mg by mouth 3 (three) times daily as needed for anxiety.     Marland Kitchen atorvastatin (LIPITOR) 20 MG tablet Take 20 mg by mouth every evening.     . Azilsartan Medoxomil (EDARBI) 80 MG TABS TAKE 80 MG BY MOUTH EVERY DAY 90 tablet 3  . diltiazem (CARDIZEM CD) 120 MG 24 hr capsule Take 1 capsule (120 mg total) by mouth 2 (two) times daily. 180 capsule 3  . halobetasol (ULTRAVATE) 0.05 % ointment APPLY TO AFFECTED AREA EVERY DAY  6  . Nebivolol HCl (BYSTOLIC) 20 MG TABS TAKE 20 MG BY MOUTH EVERY DAY 90 tablet 2  . OXYGEN Inhale 2 L into the lungs continuous.    Marland Kitchen spironolactone (ALDACTONE) 25 MG tablet TAKE 1/2 TABLET (12.5MG ) BY MOUTH EVERY DAY 135 tablet 2  . TRELEGY ELLIPTA 100-62.5-25 MCG/INH AEPB INHALE 1 PUFF BY MOUTH EVERY DAY  0  . triamcinolone cream (KENALOG) 0.1 %     . XARELTO 20 MG TABS tablet TAKE 1 TABLET BY MOUTH WITH FOOD EVERY DAY 90 tablet 2   No current facility-administered medications for this visit.    Allergies:  Metoprolol and Erythromycin   Social History: The patient  reports that she quit smoking about 4 years ago. Her smoking use included cigarettes. She started smoking about 41 years ago.  She has a 15.00 pack-year smoking history. She has never used smokeless tobacco. She reports that she does not drink alcohol or use drugs.   Family History: The patient's family history includes Asthma in her mother; Breast cancer in her sister; CAD in her father.   ROS:  Please see the history of present illness. Otherwise, complete review of systems is positive for {NONE DEFAULTED:18576::"none"}.  All other systems are reviewed and negative.   Physical Exam: VS:  There were no vitals taken for this visit., BMI There is no height or weight on file to calculate BMI.  Wt Readings from Last 3 Encounters:  10/28/18 178 lb (80.7 kg)  10/14/18 172 lb (78 kg)  10/01/18 172 lb (78 kg)    General: Patient  appears comfortable at rest. HEENT: Conjunctiva and lids normal, oropharynx clear with moist mucosa. Neck: Supple, no elevated JVP or carotid bruits, no thyromegaly. Lungs: Clear to auscultation, nonlabored breathing at rest. Cardiac: Regular rate and rhythm, no S3 or significant systolic murmur, no pericardial rub. Abdomen: Soft, nontender, no hepatomegaly, bowel sounds present, no guarding or rebound. Extremities: No pitting edema, distal pulses 2+. Skin: Warm and dry. Musculoskeletal: No kyphosis. Neuropsychiatric: Alert and oriented x3, affect grossly appropriate.  ECG:  An ECG dated 08/28/2018 was personally reviewed today and demonstrated:  Atrial fibrillation with aberrantly conducted complexes, incomplete right bundle branch block, heart rate 82.  Recent Labwork:  September 2019: Hemoglobin 10.8, platelets 120, BUN 22, creatinine 1.3, potassium 4.9, AST 20, ALT 29, cholesterol 164, HDL 62, triglycerides 88, LDL 87, magnesium 2.0, TSH 1.89  Other Studies Reviewed Today:  Echocardiogram 10/10/2015: Study Conclusions  - Left ventricle: The cavity size was normal. Wall thickness was increased in a pattern of mild LVH. Systolic function was vigorous. The estimated ejection fraction was in the range of 75% to 80%. Wall motion was normal; there were no regional wall motion abnormalities. Features are consistent with a pseudonormal left ventricular filling pattern, with concomitant abnormal relaxation and increased filling pressure (grade 2 diastolic dysfunction). - Aortic valve: Poorly visualized. Mildly calcified annulus. Probably trileaflet. Mean gradient (S): 13 mm Hg. Peak gradient (S): 24 mm Hg. Gradients likely increased due to relatively small LVOT and vigorous contraction. Limited views of valve leaflets show grossly preserved excursion. - Mitral valve: Mildly thickened leaflets . There was mild regurgitation. - Left atrium: The atrium was at the  upper limits of normal in size. - Right atrium: Central venous pressure (est): 3 mm Hg. - Tricuspid valve: There was trivial regurgitation. - Pulmonary arteries: Systolic pressure could not be accurately estimated. - Pericardium, extracardiac: A prominent pericardial fat pad was present.  Impressions:  - Mild LVH with LVEF 75-80% and grade 2 diastolic dysfunction with increased filling pressures. Normal left atrial chamber size. Mildly thickened mitral leaflets with mild mitral regurgitation. Aortic valve appears mildly sclerotic without definitive stenosis. Trivial tricuspid regurgitation, PASP not estimated.  Assessment and Plan:   Medication Adjustments/Labs and Tests Ordered: Current medicines are reviewed at length with the patient today.  Concerns regarding medicines are outlined above.   Tests Ordered: No orders of the defined types were placed in this encounter.   Medication Changes: No orders of the defined types were placed in this encounter.   Disposition:  Follow up {follow up:15908}  Signed, Jonelle SidleSamuel G. Zayonna Ayuso, MD, [facility] 04/13/2019 2:00 PM    [facility] [address], [place], [place] [address] Phone: [phone]; Fax: [phone]

## 2019-04-14 ENCOUNTER — Ambulatory Visit: Payer: Medicare Other | Admitting: Cardiology

## 2019-04-15 ENCOUNTER — Other Ambulatory Visit: Payer: Self-pay | Admitting: Cardiology

## 2019-04-15 IMAGING — DX DG CHEST 2V
2 series · 2 of 2 positions shown · non-contrast
Comparison: PA and lateral chest 11/29/2015. Single-view of the
chest 11/12/2016.

CLINICAL DATA: Atrial fibrillation.  History of COPD.

EXAM:
CHEST  2 VIEW

[chest pa]
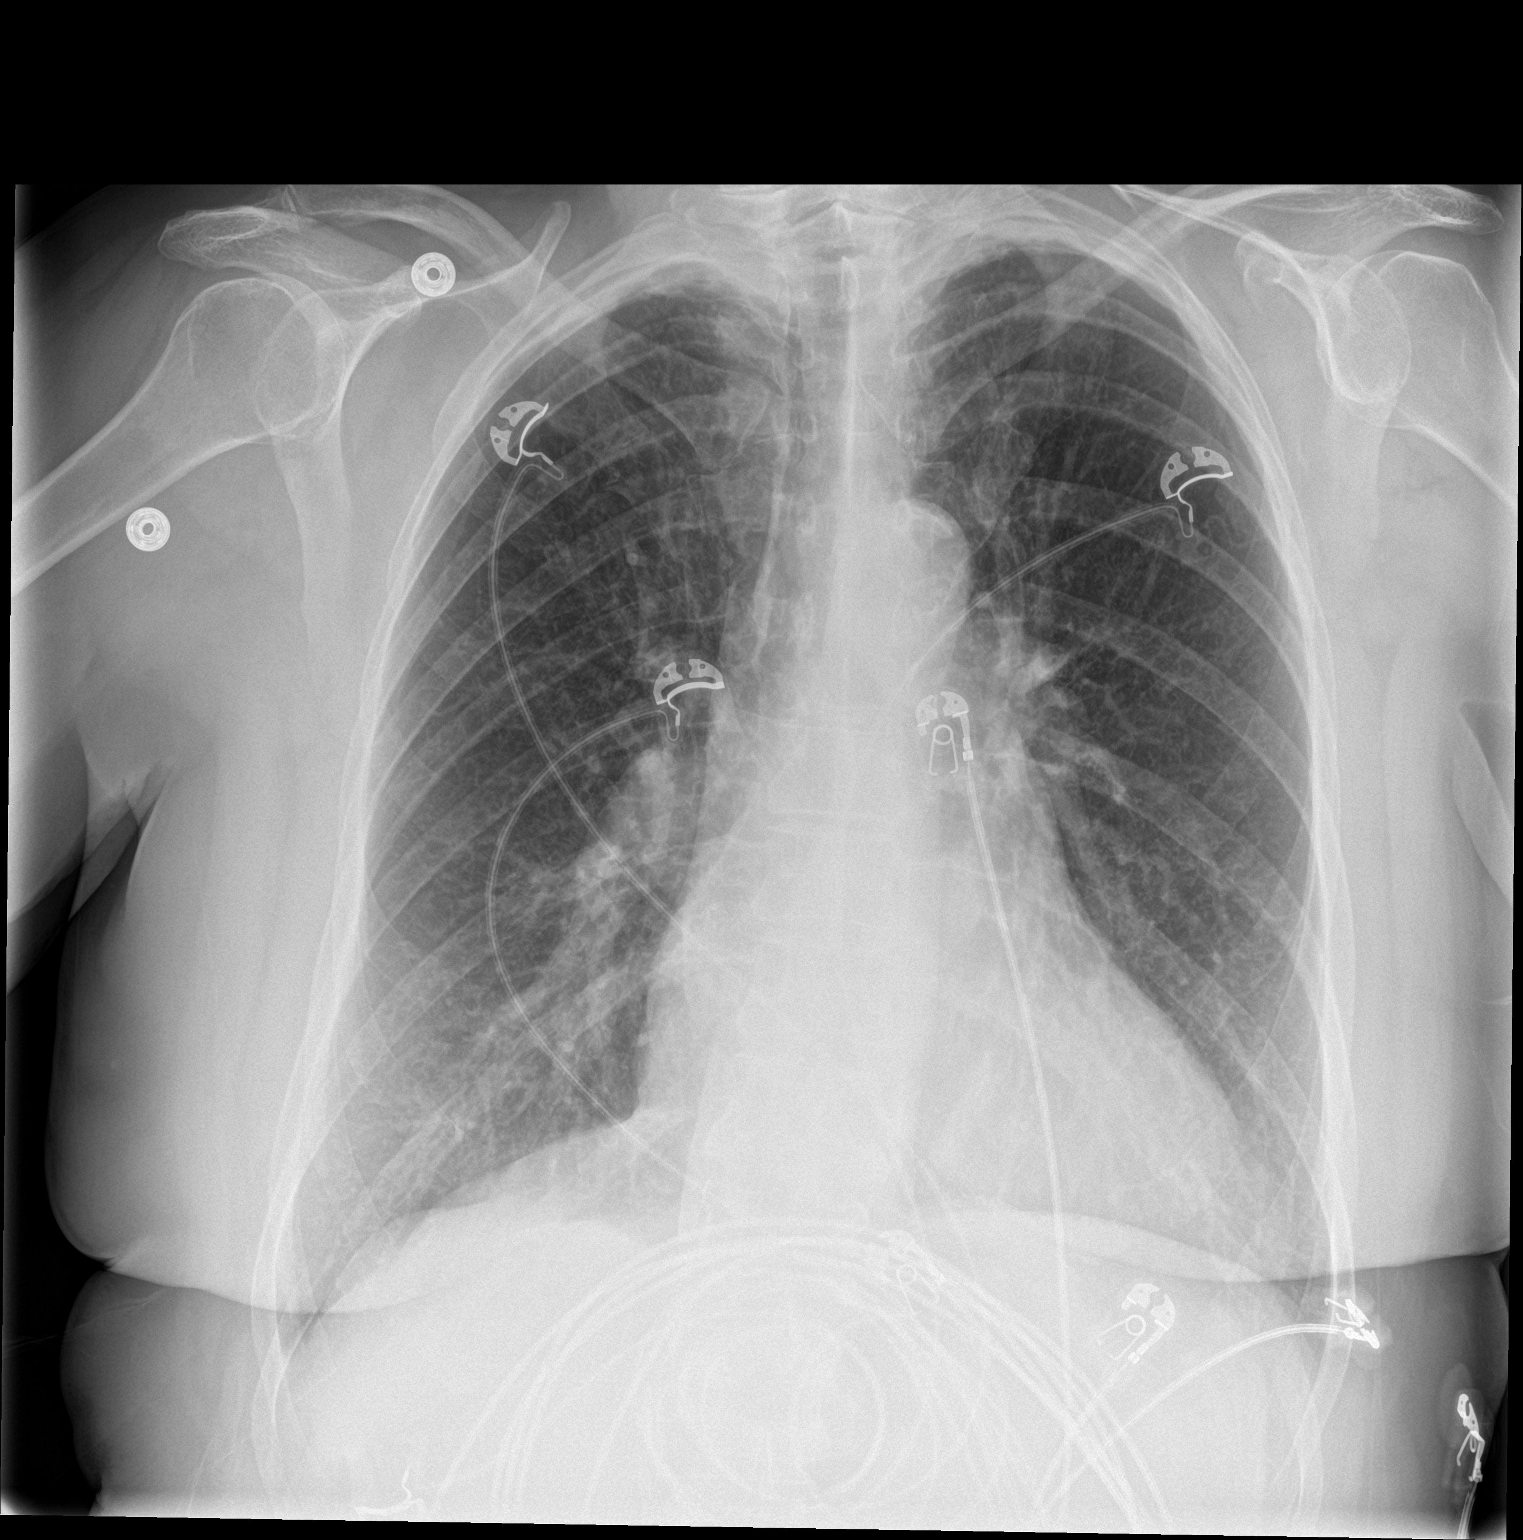

[chest lat]
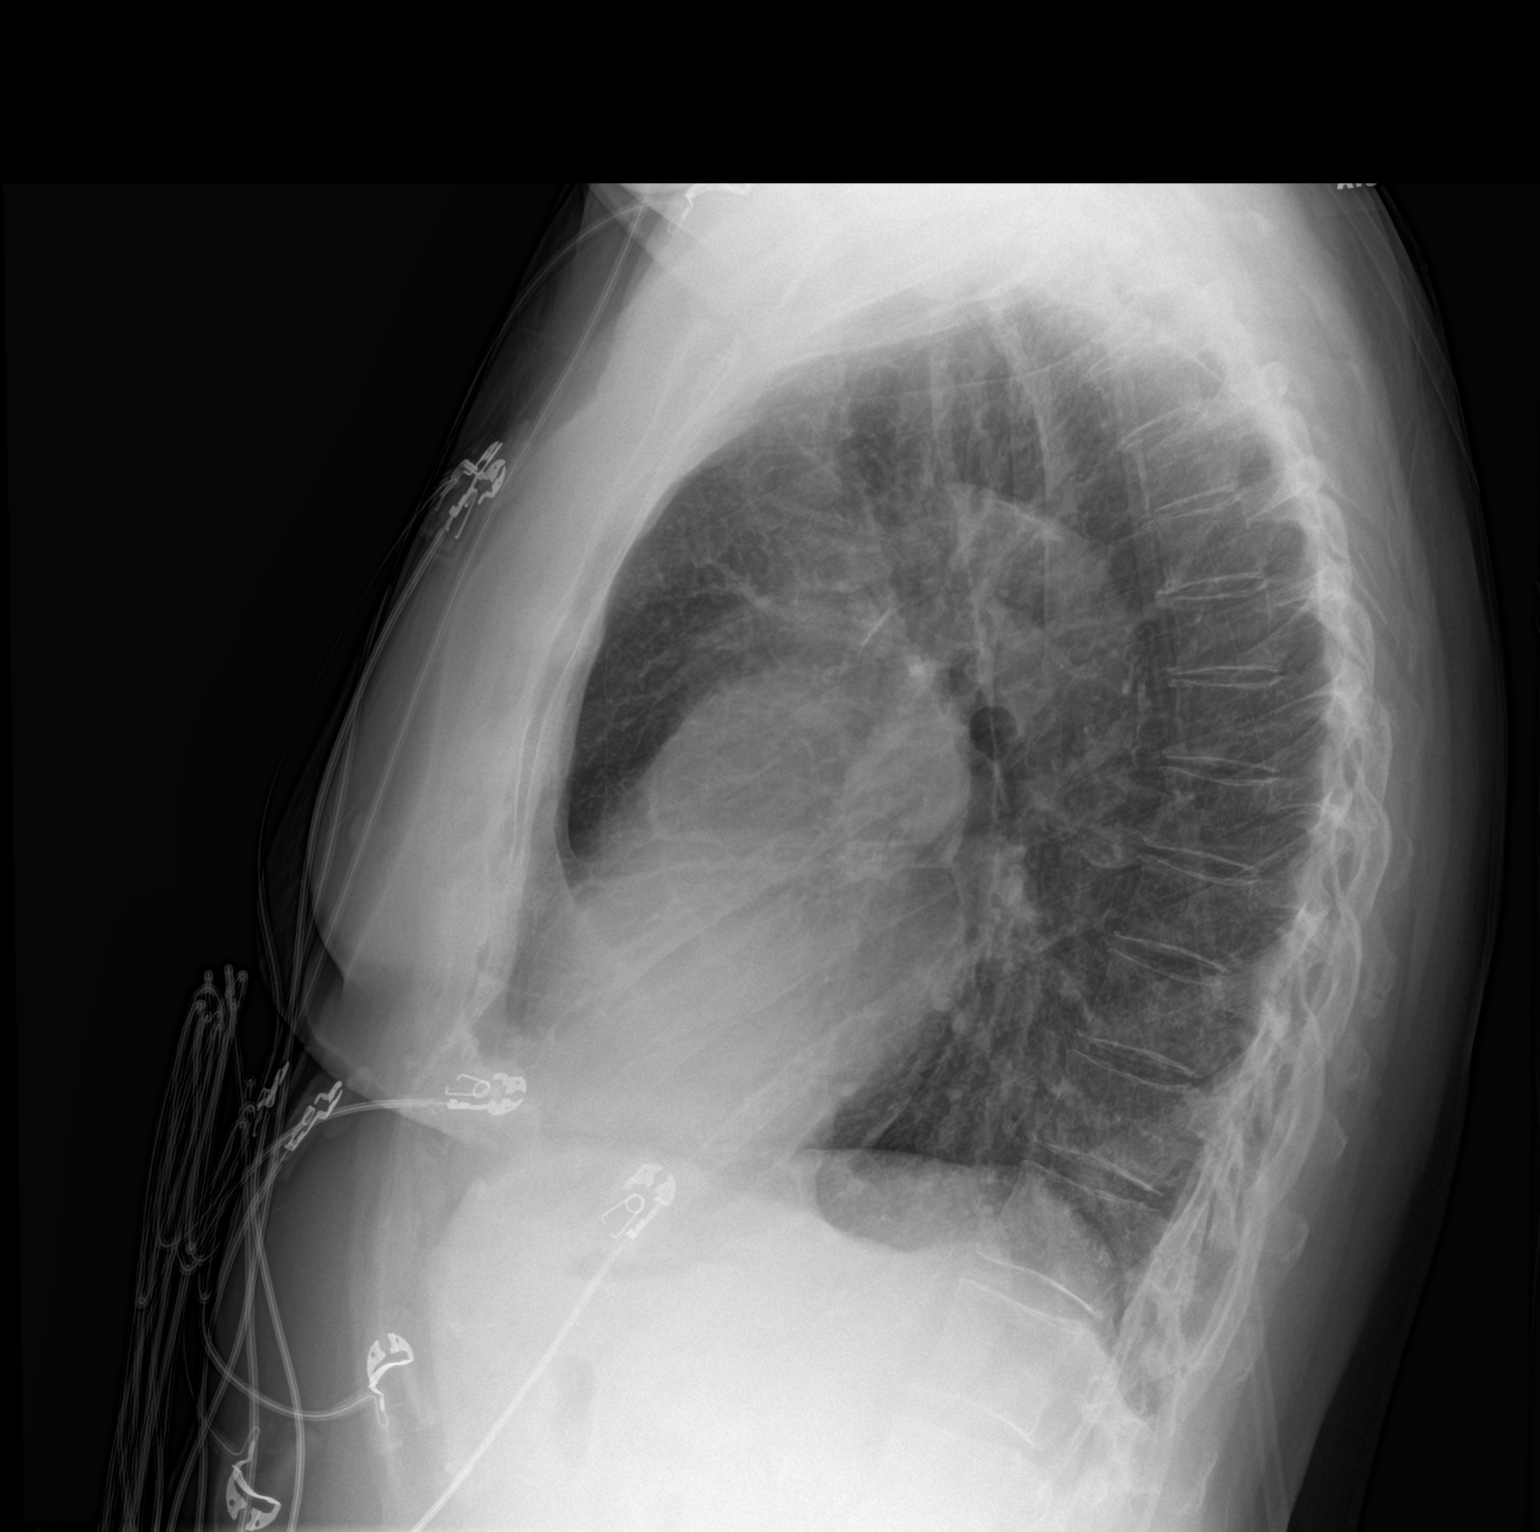

[2 of 2 positions shown; findings below may reference images not displayed]

FINDINGS: The lungs are clear. Heart size is normal. No pneumothorax or
pleural fluid. Aortic atherosclerosis noted.
IMPRESSION: No acute disease.

Atherosclerosis.

## 2019-05-06 ENCOUNTER — Encounter: Payer: Self-pay | Admitting: Internal Medicine

## 2019-05-06 ENCOUNTER — Ambulatory Visit (INDEPENDENT_AMBULATORY_CARE_PROVIDER_SITE_OTHER): Payer: Medicare Other | Admitting: Internal Medicine

## 2019-05-06 DIAGNOSIS — I4819 Other persistent atrial fibrillation: Secondary | ICD-10-CM | POA: Diagnosis not present

## 2019-05-06 DIAGNOSIS — Z950 Presence of cardiac pacemaker: Secondary | ICD-10-CM

## 2019-05-06 DIAGNOSIS — E669 Obesity, unspecified: Secondary | ICD-10-CM

## 2019-05-06 DIAGNOSIS — I5032 Chronic diastolic (congestive) heart failure: Secondary | ICD-10-CM

## 2019-05-06 LAB — CUP PACEART INCLINIC DEVICE CHECK
Battery Remaining Longevity: 142 mo
Battery Voltage: 3.01 V
Brady Statistic AP VP Percent: 0 %
Brady Statistic AP VS Percent: 0 %
Brady Statistic AS VP Percent: 0 %
Brady Statistic AS VS Percent: 0 %
Brady Statistic RA Percent Paced: 0.09 %
Brady Statistic RV Percent Paced: 45.67 %
Date Time Interrogation Session: 20200819170805
Implantable Lead Implant Date: 20190418
Implantable Lead Implant Date: 20190418
Implantable Lead Location: 753859
Implantable Lead Location: 753860
Implantable Lead Model: 3830
Implantable Lead Model: 5076
Implantable Pulse Generator Implant Date: 20190418
Lead Channel Impedance Value: 323 Ohm
Lead Channel Impedance Value: 342 Ohm
Lead Channel Impedance Value: 418 Ohm
Lead Channel Impedance Value: 475 Ohm
Lead Channel Pacing Threshold Amplitude: 1.5 V
Lead Channel Pacing Threshold Pulse Width: 1 ms
Lead Channel Sensing Intrinsic Amplitude: 2.625 mV
Lead Channel Sensing Intrinsic Amplitude: 5.125 mV
Lead Channel Setting Pacing Amplitude: 2.5 V
Lead Channel Setting Pacing Pulse Width: 1 ms
Lead Channel Setting Sensing Sensitivity: 1.2 mV

## 2019-05-06 NOTE — Patient Instructions (Signed)
Medication Instructions:  Your physician recommends that you continue on your current medications as directed. Please refer to the Current Medication list given to you today.  If you need a refill on your cardiac medications before your next appointment, please call your pharmacy.   Lab work: NONE  If you have labs (blood work) drawn today and your tests are completely normal, you will receive your results only by: Marland Kitchen MyChart Message (if you have MyChart) OR . A paper copy in the mail If you have any lab test that is abnormal or we need to change your treatment, we will call you to review the results.  Testing/Procedures: NONE   Follow-Up: At [facility], you and your health needs are our priority.  As part of our continuing mission to provide you with exceptional heart care, we have created designated Provider Care Teams.  These Care Teams include your primary Cardiologist (physician) and Advanced Practice Providers (APPs -  Physician Assistants and Nurse Practitioners) who all work together to provide you with the care you need, when you need it. You will need a follow up appointment in 1 years.  Please call our office 2 months in advance to schedule this appointment.  You may see Cristopher Peru, MD or one of the following Advanced Practice Providers on your designated Care Team:   Chanetta Marshall, NP . Tommye Standard, PA-C  Any Other Special Instructions Will Be Listed Below (If Applicable). Remote monitoring is used to monitor your Pacemaker of ICD from home. This monitoring reduces the number of office visits required to check your device to one time per year. It allows [place] to keep an eye on the functioning of your device to ensure it is working properly. You are scheduled for a device check from home on 05/13/19. You may send your transmission at any time that day. If you have a wireless device, the transmission will be sent automatically. After your physician reviews your transmission, you will  receive a postcard with your next transmission date.   Thank you for choosing [facility]!

## 2019-05-06 NOTE — Progress Notes (Signed)
HPI Tracey Dudley returns today for followup. She has persistent atrial fib, uncontrolled VR, s/p PPM insertion and AV node ablation. She had a his bundle lead placed and her AV node ablation was made more difficult by the placement of the his bundle lead. We did not achieve CHB but did not want to damage the his bundle lead during the ablation. In the interim she has gained weight as she has not been as active in the Covid pandemic. She does not have palpitations. Her sob is improved. Allergies  Allergen Reactions  . Metoprolol Shortness Of Breath and Other (See Comments)    Tiredness  . Erythromycin Nausea Only     Current Outpatient Medications  Medication Sig Dispense Refill  . acetaminophen (TYLENOL) 500 MG tablet Take 500 mg by mouth every 6 (six) hours as needed for moderate pain or headache.     . albuterol (PROVENTIL HFA;VENTOLIN HFA) 108 (90 BASE) MCG/ACT inhaler Inhale 1 puff into the lungs every 6 (six) hours as needed for wheezing or shortness of breath.    . ALPRAZolam (XANAX) 0.5 MG tablet Take 0.25-1 mg by mouth 3 (three) times daily as needed for anxiety.     Marland Kitchen atorvastatin (LIPITOR) 20 MG tablet Take 20 mg by mouth every evening.     . Azilsartan Medoxomil (EDARBI) 80 MG TABS TAKE 80 MG BY MOUTH EVERY DAY 90 tablet 3  . diltiazem (CARDIZEM CD) 120 MG 24 hr capsule Take 1 capsule (120 mg total) by mouth 2 (two) times daily. 180 capsule 3  . halobetasol (ULTRAVATE) 0.05 % ointment APPLY TO AFFECTED AREA EVERY DAY  6  . Nebivolol HCl (BYSTOLIC) 20 MG TABS TAKE 20 MG BY MOUTH EVERY DAY 90 tablet 2  . OXYGEN Inhale 2 L into the lungs continuous.    Marland Kitchen spironolactone (ALDACTONE) 25 MG tablet TAKE 1/2 TABLET (12.5MG ) BY MOUTH EVERY DAY 135 tablet 2  . TRELEGY ELLIPTA 100-62.5-25 MCG/INH AEPB INHALE 1 PUFF BY MOUTH EVERY DAY  0  . triamcinolone cream (KENALOG) 0.1 %     . XARELTO 20 MG TABS tablet TAKE 1 TABLET BY MOUTH WITH FOOD EVERY DAY 90 tablet 2   No current  facility-administered medications for this visit.      Past Medical History:  Diagnosis Date  . Anxiety   . Atrial fibrillation ([place])   . COPD (chronic obstructive pulmonary disease) ([place])   . Essential hypertension   . Herpes zoster   . Hyperlipidemia   . PAF (paroxysmal atrial fibrillation) ([facility])    Followed by Dr. Gibson Ramp in [place], also [place] EP assessment by Dr. Westley Gambles  . 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 Vitamin D deficiency     ROS:   All systems reviewed and negative except as noted in the HPI.   Past Surgical History:  Procedure Laterality Date  . ABDOMINAL HYSTERECTOMY    . ABLATION OF DYSRHYTHMIC FOCUS  01/02/2018  . AV NODE ABLATION N/A 01/02/2018   Procedure: AV NODE ABLATION;  Surgeon: Evans Lance, MD;  Location: [facility];  Service: Cardiovascular;  Laterality: N/A;  . CATARACT EXTRACTION    . INSERT / REPLACE / REMOVE PACEMAKER  01/02/2018   DUAL CHAMBER  . PACEMAKER IMPLANT N/A 01/02/2018   Procedure: PACEMAKER IMPLANT - Dual Chamber;  Surgeon: Evans Lance, MD;  Location: [facility];  Service: Cardiovascular;  Laterality: N/A;     Family  History  Problem Relation Age of Onset  . Breast cancer Sister   . CAD Father   . Asthma Mother      Social History   Socioeconomic History  . Marital status: Married    Spouse name: Not on file  . Number of children: Not on file  . Years of education: Not on file  . Highest education level: Not on file  Occupational History  . Not on file  Social Needs  . Financial resource strain: Not on file  . Food insecurity    Worry: Not on file    Inability: Not on file  . Transportation needs    Medical: Not on file    Non-medical: Not on file  Tobacco Use  . Smoking status: Former Smoker    Packs/day: 0.50    Years: 30.00    Pack years: 15.00    Types: Cigarettes    Start date: 11/03/1977    Quit date: 06/01/2014    Years since quitting: 4.9  .  Smokeless tobacco: Never Used  Substance and Sexual Activity  . Alcohol use: No    Alcohol/week: 0.0 standard drinks  . Drug use: No  . Sexual activity: Yes  Lifestyle  . Physical activity    Days per week: Not on file    Minutes per session: Not on file  . Stress: Not on file  Relationships  . Social Musicianconnections    Talks on phone: Not on file    Gets together: Not on file    Attends religious service: Not on file    Active member of club or organization: Not on file    Attends meetings of clubs or organizations: Not on file    Relationship status: Not on file  . Intimate partner violence    Fear of current or ex partner: Not on file    Emotionally abused: Not on file    Physically abused: Not on file    Forced sexual activity: Not on file  Other Topics Concern  . Not on file  Social History Narrative  . Not on file     BP (!) 170/74 (BP Location: Right Arm)   Pulse 74   Temp (!) 97.4 F (36.3 C)   Ht 5' 6.5" (1.689 m)   Wt 191 lb (86.6 kg)   SpO2 (!) 88%   BMI 30.37 kg/m   Physical Exam:  Well appearing overweight 73 yo woman, NAD HEENT: Unremarkable Neck:  6 cm JVD, no thyromegally Lymphatics:  No adenopathy Back:  No CVA tenderness Lungs:  Clear with no wheezes HEART:  IRegular rate rhythm, no murmurs, no rubs, no clicks Abd:  soft, positive bowel sounds, no organomegally, no rebound, no guarding Ext:  2 plus pulses, no edema, no cyanosis, no clubbing Skin:  No rashes no nodules Neuro:  CN II through XII intact, motor grossly intact   DEVICE  Normal device function.  See PaceArt for details.   Assess/Plan: 1. Atrial fib - her VR is controlled reasonably well. She is not symptomatic. Continue rate control. 2. PPM - her Medtronic DDD PM has been reprogrammed VVIR. We have reprogrammed her outputs for maximum battery longevity. 3. Obesity - I strongly encouraged her to lose weight.  4. Chronic diastolic heart failure - she is stable. She will continue  her current meds and I have asked her to reduce her salt intake and to lose weight.  [name] ,M.D.

## 2019-05-13 ENCOUNTER — Ambulatory Visit (INDEPENDENT_AMBULATORY_CARE_PROVIDER_SITE_OTHER): Payer: Medicare Other | Admitting: *Deleted

## 2019-05-13 DIAGNOSIS — I480 Paroxysmal atrial fibrillation: Secondary | ICD-10-CM

## 2019-05-14 LAB — CUP PACEART REMOTE DEVICE CHECK
Brady Statistic RA Percent Paced: 0 %
Brady Statistic RV Percent Paced: 28.4 %
Date Time Interrogation Session: 20200827152952
Implantable Lead Implant Date: 20190418
Implantable Lead Implant Date: 20190418
Implantable Lead Location: 753859
Implantable Lead Location: 753860
Implantable Lead Model: 3830
Implantable Lead Model: 5076
Implantable Pulse Generator Implant Date: 20190418
Lead Channel Impedance Value: 456 Ohm
Lead Channel Impedance Value: 475 Ohm
Lead Channel Sensing Intrinsic Amplitude: 1.6 mV
Lead Channel Sensing Intrinsic Amplitude: 3.4 mV
Lead Channel Setting Pacing Amplitude: 2.5 V
Lead Channel Setting Pacing Pulse Width: 1 ms
Lead Channel Setting Sensing Sensitivity: 1.2 mV

## 2019-05-22 ENCOUNTER — Encounter: Payer: Self-pay | Admitting: Cardiology

## 2019-05-22 NOTE — Progress Notes (Signed)
Remote pacemaker transmission.   

## 2019-05-26 ENCOUNTER — Other Ambulatory Visit: Payer: Self-pay | Admitting: Cardiology

## 2019-06-16 ENCOUNTER — Encounter: Payer: Self-pay | Admitting: Cardiology

## 2019-06-16 ENCOUNTER — Other Ambulatory Visit: Payer: Self-pay

## 2019-06-16 ENCOUNTER — Ambulatory Visit: Payer: Medicare Other | Admitting: Cardiology

## 2019-06-16 VITALS — BP 146/86 | HR 82 | Temp 98.3°F | Ht 65.0 in | Wt 189.0 lb

## 2019-06-16 DIAGNOSIS — I10 Essential (primary) hypertension: Secondary | ICD-10-CM | POA: Diagnosis not present

## 2019-06-16 DIAGNOSIS — I4819 Other persistent atrial fibrillation: Secondary | ICD-10-CM

## 2019-06-16 DIAGNOSIS — Z950 Presence of cardiac pacemaker: Secondary | ICD-10-CM | POA: Diagnosis not present

## 2019-06-16 NOTE — Progress Notes (Signed)
Cardiology Office Note  Date: 06/16/2019   ID: [id number] CREW, DOB 12/06/1945, MRN [id number]  PCP:  Sherrilee Gilles, DO  Cardiologist:  Rozann Lesches, MD Electrophysiologist:  Cristopher Peru, MD   Chief Complaint  Patient presents with  . Cardiac follow-up    History of Present Illness: Tracey Dudley is a 73 y.o. female last seen in December 2019.  She presents for a routine visit.  She states that she has not had any recent sense of palpitations or chest pain.  Dyspnea on exertion is stable as before and she remains on supplemental oxygen, still following with Dr. Luan Pulling at this point.  She continues to follow with Dr. Lovena Le, history of incomplete AV node ablation with placement of Medtronic pacemaker and His bundle lead.  She had a follow-up visit in August with plan to continue rate control strategy, device reprogrammed at VVIR.  She has been taken off Tikosyn with increase in Cardizem CD dosing.  She does not report any bleeding problems on Xarelto and indicates blood work with her PCP about 1 month ago.  Past Medical History:  Diagnosis Date  . Anxiety   . Atrial fibrillation ([place])   . COPD (chronic obstructive pulmonary disease) ([place])   . Essential hypertension   . Herpes zoster   . Hyperlipidemia   . PAF (paroxysmal atrial fibrillation) ([facility])    Followed by Dr. Gibson Ramp in [place], also [place] EP assessment by Dr. Westley Gambles  . 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 Vitamin D deficiency     Past Surgical History:  Procedure Laterality Date  . ABDOMINAL HYSTERECTOMY    . ABLATION OF DYSRHYTHMIC FOCUS  01/02/2018  . AV NODE ABLATION N/A 01/02/2018   Procedure: AV NODE ABLATION;  Surgeon: Evans Lance, MD;  Location: [facility];  Service: Cardiovascular;  Laterality: N/A;  . CATARACT EXTRACTION    . INSERT / REPLACE / REMOVE PACEMAKER  01/02/2018   DUAL CHAMBER  . PACEMAKER IMPLANT N/A 01/02/2018   Procedure: PACEMAKER IMPLANT -  Dual Chamber;  Surgeon: Evans Lance, MD;  Location: [facility];  Service: Cardiovascular;  Laterality: N/A;    Current Outpatient Medications  Medication Sig Dispense Refill  . acetaminophen (TYLENOL) 500 MG tablet Take 500 mg by mouth every 6 (six) hours as needed for moderate pain or headache.     . albuterol (PROVENTIL HFA;VENTOLIN HFA) 108 (90 BASE) MCG/ACT inhaler Inhale 1 puff into the lungs every 6 (six) hours as needed for wheezing or shortness of breath.    . ALPRAZolam (XANAX) 0.5 MG tablet Take 0.25-1 mg by mouth 3 (three) times daily as needed for anxiety.     Marland Kitchen atorvastatin (LIPITOR) 20 MG tablet Take 20 mg by mouth every evening.     . Azilsartan Medoxomil (EDARBI) 80 MG TABS TAKE 80 MG BY MOUTH EVERY DAY 90 tablet 3  . diltiazem (CARDIZEM CD) 120 MG 24 hr capsule Take 1 capsule (120 mg total) by mouth 2 (two) times daily. 180 capsule 3  . halobetasol (ULTRAVATE) 0.05 % ointment APPLY TO AFFECTED AREA EVERY DAY  6  . Nebivolol HCl (BYSTOLIC) 20 MG TABS TAKE 1 TABLET BY MOUTH EVERY DAY 90 tablet 2  . OXYGEN Inhale 2 L into the lungs continuous.    Marland Kitchen PARoxetine (PAXIL) 10 MG tablet Take 10 mg by mouth daily.    Marland Kitchen spironolactone (ALDACTONE) 25 MG tablet TAKE 1/2 TABLET (12.5MG ) BY  MOUTH EVERY DAY 135 tablet 2  . TRELEGY ELLIPTA 100-62.5-25 MCG/INH AEPB INHALE 1 PUFF BY MOUTH EVERY DAY  0  . triamcinolone cream (KENALOG) 0.1 %     . XARELTO 20 MG TABS tablet TAKE 1 TABLET BY MOUTH WITH FOOD EVERY DAY 90 tablet 2   No current facility-administered medications for this visit.    Allergies:  Metoprolol and Erythromycin   Social History: The patient  reports that she quit smoking about 5 years ago. Her smoking use included cigarettes. She started smoking about 41 years ago. She has a 15.00 pack-year smoking history. She has never used smokeless tobacco. She reports that she does not drink alcohol or use drugs.   ROS:  Please see the history of present illness. Otherwise,  complete review of systems is positive for recent "stomach virus."  All other systems are reviewed and negative.   Physical Exam: VS:  BP (!) 146/86 (BP Location: Right Arm)   Pulse 82   Temp 98.3 F (36.8 C)   Ht 5\' 5"  (1.651 m)   Wt 189 lb (85.7 kg)   SpO2 98%   BMI 31.45 kg/m , BMI Body mass index is 31.45 kg/m.  Wt Readings from Last 3 Encounters:  06/16/19 189 lb (85.7 kg)  05/06/19 191 lb (86.6 kg)  10/28/18 178 lb (80.7 kg)    General: Patient appears comfortable at rest.  Region. HEENT: Conjunctiva and lids normal, wearing a mask. Neck: Supple, no elevated JVP or carotid bruits, no thyromegaly. Lungs: Clear to auscultation, nonlabored breathing at rest. Cardiac: Irregular, no S3, soft systolic murmur. Abdomen: Soft, nontender, bowel sounds present. Extremities: No pitting edema, distal pulses 2+. Skin: Warm and dry. Musculoskeletal: No kyphosis. Neuropsychiatric: Alert and oriented x3, affect grossly appropriate.  ECG:  An ECG dated 08/28/2018 was personally reviewed today and demonstrated:  Atrial fibrillation with aberrantly conducted complexes, incomplete right bundle branch block.  Recent Labwork:  September 2019: Hemoglobin 10.8, platelets 120, BUN 22, creatinine 1.3, potassium 4.9, AST 20, ALT 29, cholesterol 164, HDL 62, triglycerides 88, LDL 87, magnesium 2.0, TSH 1.89  Other Studies Reviewed Today:  Echocardiogram 10/10/2015: Study Conclusions  - Left ventricle: The cavity size was normal. Wall thickness was increased in a pattern of mild LVH. Systolic function was vigorous. The estimated ejection fraction was in the range of 75% to 80%. Wall motion was normal; there were no regional wall motion abnormalities. Features are consistent with a pseudonormal left ventricular filling pattern, with concomitant abnormal relaxation and increased filling pressure (grade 2 diastolic dysfunction). - Aortic valve: Poorly visualized. Mildly calcified  annulus. Probably trileaflet. Mean gradient (S): 13 mm Hg. Peak gradient (S): 24 mm Hg. Gradients likely increased due to relatively small LVOT and vigorous contraction. Limited views of valve leaflets show grossly preserved excursion. - Mitral valve: Mildly thickened leaflets . There was mild regurgitation. - Left atrium: The atrium was at the upper limits of normal in size. - Right atrium: Central venous pressure (est): 3 mm Hg. - Tricuspid valve: There was trivial regurgitation. - Pulmonary arteries: Systolic pressure could not be accurately estimated. - Pericardium, extracardiac: A prominent pericardial fat pad was present.  Impressions:  - Mild LVH with LVEF 75-80% and grade 2 diastolic dysfunction with increased filling pressures. Normal left atrial chamber size. Mildly thickened mitral leaflets with mild mitral regurgitation. Aortic valve appears mildly sclerotic without definitive stenosis. Trivial tricuspid regurgitation, PASP not estimated.  Assessment and Plan:  1.  Persistent atrial fibrillation, planning management  strategy for permanent atrial fibrillation with rate control and anticoagulation.  Recent medication changes noted.  Requesting interval lab work from PCP.  2.  Medtronic pacemaker in place with His bundle lead and incomplete AV node ablation.  She continues to follow with Dr. Ladona Ridgelaylor.  3.  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  She continues with pulmonary rehabilitation and follows with Dr. Juanetta GoslingHawkins at this point.  4.  Essential hypertension, no changes made to current regimen.  Medication Adjustments/Labs and Tests Ordered: Current medicines are reviewed at length with the patient today.  Concerns regarding medicines are outlined above.   Tests Ordered: No orders of the defined types were placed in this encounter.   Medication Changes: No orders of the defined types were placed in this  encounter.   Disposition:  [place] 6 [place] in the [place] office.  Signed, Jonelle SidleSamuel G. Lisa Blakeman, MD, [facility]ACC 06/16/2019 2:02 PM    [facility] [address], [place], [place] [address] [place] ne: [phone]; Fax: [phone]

## 2019-06-16 NOTE — Patient Instructions (Signed)
Medication Instructions: Your physician recommends that you continue on your current medications as directed. Please refer to the Current Medication list given to you today.   Labwork: None today  Procedures/Testing: None  Follow-Up: 6 months with Dr.McDowell  Any Additional Special Instructions Will Be Listed Below (If Applicable).     If you need a refill on your cardiac medications before your next appointment, please call your pharmacy.        Thank you for choosing [facility] !        

## 2019-08-12 ENCOUNTER — Ambulatory Visit (INDEPENDENT_AMBULATORY_CARE_PROVIDER_SITE_OTHER): Payer: Medicare Other | Admitting: *Deleted

## 2019-08-12 DIAGNOSIS — I4819 Other persistent atrial fibrillation: Secondary | ICD-10-CM

## 2019-08-13 LAB — CUP PACEART REMOTE DEVICE CHECK
Battery Remaining Longevity: 132 mo
Brady Statistic RV Percent Paced: 36.4 %
Date Time Interrogation Session: 20201126063008
Implantable Lead Implant Date: 20190418
Implantable Lead Implant Date: 20190418
Implantable Lead Location: 753859
Implantable Lead Location: 753860
Implantable Lead Model: 3830
Implantable Lead Model: 5076
Implantable Pulse Generator Implant Date: 20190418
Lead Channel Impedance Value: 475 Ohm
Lead Channel Impedance Value: 581 Ohm
Lead Channel Sensing Intrinsic Amplitude: 1.6 mV
Lead Channel Sensing Intrinsic Amplitude: 5 mV
Lead Channel Setting Pacing Amplitude: 2.5 V
Lead Channel Setting Pacing Pulse Width: 1 ms
Lead Channel Setting Sensing Sensitivity: 1.2 mV

## 2019-10-12 ENCOUNTER — Other Ambulatory Visit: Payer: Self-pay

## 2019-10-12 MED ORDER — DILTIAZEM HCL ER COATED BEADS 120 MG PO CP24: 120.0000 mg | ORAL_CAPSULE | Freq: Two times a day (BID) | ORAL | 3 refills | Status: DC

## 2019-10-26 ENCOUNTER — Other Ambulatory Visit: Payer: Self-pay | Admitting: Cardiology

## 2019-11-11 ENCOUNTER — Ambulatory Visit (INDEPENDENT_AMBULATORY_CARE_PROVIDER_SITE_OTHER): Payer: Medicare Other | Admitting: *Deleted

## 2019-11-11 DIAGNOSIS — I4819 Other persistent atrial fibrillation: Secondary | ICD-10-CM | POA: Diagnosis not present

## 2019-11-11 LAB — CUP PACEART REMOTE DEVICE CHECK
Battery Remaining Longevity: 140 mo
Battery Voltage: 3.02 V
Brady Statistic AP VP Percent: 0 %
Brady Statistic AP VS Percent: 0 %
Brady Statistic AS VP Percent: 31.12 %
Brady Statistic AS VS Percent: 68.88 %
Brady Statistic RA Percent Paced: 0 %
Brady Statistic RV Percent Paced: 31.12 %
Date Time Interrogation Session: 20210223190626
Implantable Lead Implant Date: 20190418
Implantable Lead Implant Date: 20190418
Implantable Lead Location: 753859
Implantable Lead Location: 753860
Implantable Lead Model: 3830
Implantable Lead Model: 5076
Implantable Pulse Generator Implant Date: 20190418
Lead Channel Impedance Value: 323 Ohm
Lead Channel Impedance Value: 380 Ohm
Lead Channel Impedance Value: 475 Ohm
Lead Channel Impedance Value: 589 Ohm
Lead Channel Pacing Threshold Amplitude: 0.625 V
Lead Channel Pacing Threshold Amplitude: 1.75 V
Lead Channel Pacing Threshold Pulse Width: 0.4 ms
Lead Channel Pacing Threshold Pulse Width: 0.4 ms
Lead Channel Sensing Intrinsic Amplitude: 1.625 mV
Lead Channel Sensing Intrinsic Amplitude: 2.625 mV
Lead Channel Sensing Intrinsic Amplitude: 4.25 mV
Lead Channel Sensing Intrinsic Amplitude: 4.25 mV
Lead Channel Setting Pacing Amplitude: 2.5 V
Lead Channel Setting Pacing Pulse Width: 1 ms
Lead Channel Setting Sensing Sensitivity: 1.2 mV

## 2019-11-12 NOTE — Progress Notes (Signed)
PPM Remote  

## 2019-11-27 ENCOUNTER — Other Ambulatory Visit: Payer: Self-pay | Admitting: Cardiology

## 2019-12-01 ENCOUNTER — Institutional Professional Consult (permissible substitution): Payer: Medicare Other | Admitting: Critical Care Medicine

## 2019-12-08 ENCOUNTER — Encounter: Payer: Self-pay | Admitting: Cardiology

## 2019-12-08 ENCOUNTER — Ambulatory Visit: Payer: Medicare Other | Admitting: Cardiology

## 2019-12-08 VITALS — BP 124/70 | HR 76 | Temp 97.1°F | Ht 66.0 in | Wt 195.0 lb

## 2019-12-08 DIAGNOSIS — I10 Essential (primary) hypertension: Secondary | ICD-10-CM

## 2019-12-08 DIAGNOSIS — I4819 Other persistent atrial fibrillation: Secondary | ICD-10-CM

## 2019-12-08 DIAGNOSIS — J449 Chronic obstructive pulmonary disease, unspecified: Secondary | ICD-10-CM

## 2019-12-08 DIAGNOSIS — Z950 Presence of cardiac pacemaker: Secondary | ICD-10-CM | POA: Diagnosis not present

## 2019-12-08 NOTE — Progress Notes (Signed)
Cardiology Office Note  Date: 12/08/2019   ID: Tracey Dudley, DOB 1946-03-23, MRN [id number]  PCP:  Sherrilee Gilles, DO  Cardiologist:  Rozann Lesches, MD Electrophysiologist:  Cristopher Peru, MD   Chief Complaint  Patient presents with  . Cardiac follow-up    History of Present Illness: Tracey Dudley is a 74 y.o. female last seen in September 2020.  She is here for a routine follow-up visit.  She is no longer participating in pulmonary rehab, has purchased her own exercise equipment and has been exercising regularly with her husband at home.  She does not report any chest pain or palpitations.  Still wearing supplemental oxygen and plans to establish with [facility] going forward.  She follows with Dr. Lovena Le in the device clinic, history of incomplete AV node ablation with placement of Medtronic pacemaker and His bundle lead.  She remains on Xarelto, no reported bleeding problems.  She will be having lab work with her PCP in about 3 weeks.  She is also on Bystolic and Cardizem CD.  She does not feel any sense of palpitations.  I personally reviewed her ECG today which shows atrial fibrillation with right bundle branch block.  Past Medical History:  Diagnosis Date  . Anxiety   . Atrial fibrillation ([place])    Followed by Dr. Gibson Ramp in [place], also [facility] EP assessment by Dr. Westley Gambles  . COPD (chronic obstructive pulmonary disease) ([place])   . Essential hypertension   . Herpes zoster   . Hyperlipidemia   . 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 Vitamin D deficiency     Past Surgical History:  Procedure Laterality Date  . ABDOMINAL HYSTERECTOMY    . ABLATION OF DYSRHYTHMIC FOCUS  01/02/2018  . AV NODE ABLATION N/A 01/02/2018   Procedure: AV NODE ABLATION;  Surgeon: Evans Lance, MD;  Location: [facility];  Service: Cardiovascular;  Laterality: N/A;  . CATARACT EXTRACTION    . INSERT / REPLACE / REMOVE PACEMAKER  01/02/2018   DUAL CHAMBER  .  PACEMAKER IMPLANT N/A 01/02/2018   Procedure: PACEMAKER IMPLANT - Dual Chamber;  Surgeon: Evans Lance, MD;  Location: [facility];  Service: Cardiovascular;  Laterality: N/A;    Current Outpatient Medications  Medication Sig Dispense Refill  . acetaminophen (TYLENOL) 500 MG tablet Take 500 mg by mouth every 6 (six) hours as needed for moderate pain or headache.     . albuterol (PROVENTIL HFA;VENTOLIN HFA) 108 (90 BASE) MCG/ACT inhaler Inhale 1 puff into the lungs every 6 (six) hours as needed for wheezing or shortness of breath.    . ALPRAZolam (XANAX) 0.5 MG tablet Take 0.25-1 mg by mouth 3 (three) times daily as needed for anxiety.     Marland Kitchen atorvastatin (LIPITOR) 20 MG tablet Take 20 mg by mouth every evening.     . Azilsartan Medoxomil (EDARBI) 80 MG TABS TAKE 80 MG BY MOUTH EVERY DAY 90 tablet 3  . BYSTOLIC 20 MG TABS TAKE 1 TABLET BY MOUTH EVERY DAY 90 tablet 2  . diltiazem (CARDIZEM CD) 120 MG 24 hr capsule Take 1 capsule (120 mg total) by mouth 2 (two) times daily. 180 capsule 3  . halobetasol (ULTRAVATE) 0.05 % ointment APPLY TO AFFECTED AREA EVERY DAY  6  . OXYGEN Inhale 2 L into the lungs continuous.    Marland Kitchen PARoxetine (PAXIL) 10 MG tablet Take 10 mg by mouth daily.    Marland Kitchen spironolactone (ALDACTONE) 25 MG tablet  TAKE 1/2 TABLET (12.5MG ) BY MOUTH EVERY DAY 135 tablet 2  . TRELEGY ELLIPTA 100-62.5-25 MCG/INH AEPB INHALE 1 PUFF BY MOUTH EVERY DAY  0  . triamcinolone cream (KENALOG) 0.1 %     . XARELTO 20 MG TABS tablet TAKE 1 TABLET BY MOUTH WITH FOOD EVERY DAY 90 tablet 2   No current facility-administered medications for this visit.   Allergies:  Metoprolol and Erythromycin   ROS:   Chronic dyspnea exertion as before.  States that she had her first coronavirus vaccine dose recently.  Physical Exam: VS:  BP 124/70   Pulse 76   Temp (!) 97.1 F (36.2 C)   Ht 5\' 6"  (1.676 m)   Wt 195 lb (88.5 kg)   SpO2 96%   BMI 31.47 kg/m , BMI Body mass index is 31.47 kg/m.  Wt  Readings from Last 3 Encounters:  12/08/19 195 lb (88.5 kg)  06/16/19 189 lb (85.7 kg)  05/06/19 191 lb (86.6 kg)    General: Patient appears comfortable at rest.  Wearing oxygen via nasal cannula. HEENT: Conjunctiva and lids normal, wearing a mask. Neck: Supple, no elevated JVP or carotid bruits, no thyromegaly. Lungs: Decreased breath sounds throughout, nonlabored breathing at rest. Cardiac: Irregular, no S3, soft systolic murmur. Abdomen: Soft, nontender, bowel sounds present. Extremities: No pitting edema, distal pulses 2+.  ECG:  An ECG dated 08/28/2018 was personally reviewed today and demonstrated:  Atrial fibrillation with aberrantly conducted complexes, incomplete right bundle branch block.  Recent Labwork:  August 2020: Hemoglobin 13.0, platelets 153, potassium 5.0, BUN 23, creatinine 1.4, AST 21, ALT 20, cholesterol 152, HDL 71, triglycerides 93, LDL 65  Other Studies Reviewed Today:  Echocardiogram 10/10/2015: Study Conclusions  - Left ventricle: The cavity size was normal. Wall thickness was increased in a pattern of mild LVH. Systolic function was vigorous. The estimated ejection fraction was in the range of 75% to 80%. Wall motion was normal; there were no regional wall motion abnormalities. Features are consistent with a pseudonormal left ventricular filling pattern, with concomitant abnormal relaxation and increased filling pressure (grade 2 diastolic dysfunction). - Aortic valve: Poorly visualized. Mildly calcified annulus. Probably trileaflet. Mean gradient (S): 13 mm Hg. Peak gradient (S): 24 mm Hg. Gradients likely increased due to relatively small LVOT and vigorous contraction. Limited views of valve leaflets show grossly preserved excursion. - Mitral valve: Mildly thickened leaflets . There was mild regurgitation. - Left atrium: The atrium was at the upper limits of normal in size. - Right atrium: Central venous pressure (est):  3 mm Hg. - Tricuspid valve: There was trivial regurgitation. - Pulmonary arteries: Systolic pressure could not be accurately estimated. - Pericardium, extracardiac: A prominent pericardial fat pad was present.  Impressions:  - Mild LVH with LVEF 75-80% and grade 2 diastolic dysfunction with increased filling pressures. Normal left atrial chamber size. Mildly thickened mitral leaflets with mild mitral regurgitation. Aortic valve appears mildly sclerotic without definitive stenosis. Trivial tricuspid regurgitation, PASP not estimated.  Assessment and Plan:  1.  Permanent atrial fibrillation.  CHA2DS2-VASc score is 3.  She continues on Cardizem CD, Bystolic, and Xarelto.  No reported bleeding episodes.  She will have follow-up lab work with PCP in about 3 weeks.  2.  Medtronic pacemaker in place with His bundle lead status post incomplete AV node ablation.  She continues to follow with Dr. 10/12/2015.  Atrial fibrillation has been less symptomatic for her following this intervention.  3.  COPD with chronic hypoxic respiratory failure.  She is establishing with [facility] following the retirement of Dr. Juanetta Gosling.  Medication Adjustments/Labs and Tests Ordered: Current medicines are reviewed at length with the patient today.  Concerns regarding medicines are outlined above.   Tests Ordered: Orders Placed This Encounter  Procedures  . EKG 12-Lead    Medication Changes: No orders of the defined types were placed in this encounter.   Disposition:  Follow up 6 months in the [place] office.  Signed, Jonelle Sidle, MD, [facility] 12/08/2019 1:26 PM    [facility] at [facility] [address] [place], [place] [address] Phone: [phone]; Fax: [phone]

## 2019-12-08 NOTE — Patient Instructions (Signed)
Medication Instructions:  °Your physician recommends that you continue on your current medications as directed. Please refer to the Current Medication list given to you today. ° °*If you need a refill on your cardiac medications before your next appointment, please call your pharmacy* ° ° °Lab Work: °None today °If you have labs (blood work) drawn today and your tests are completely normal, you will receive your results only by: °• MyChart Message (if you have MyChart) OR °• A paper copy in the mail °If you have any lab test that is abnormal or we need to change your treatment, we will call you to review the results. ° ° °Testing/Procedures: °None today ° ° °Follow-Up: °At [facility], you and your health needs are our priority.  As part of our continuing mission to provide you with exceptional heart care, we have created designated Provider Care Teams.  These Care Teams include your primary Cardiologist (physician) and Advanced Practice Providers (APPs -  Physician Assistants and Nurse Practitioners) who all work together to provide you with the care you need, when you need it. ° °We recommend signing up for the patient portal called "MyChart".  Sign up information is provided on this After Visit Summary.  MyChart is used to connect with patients for Virtual Visits (Telemedicine).  Patients are able to view lab/test results, encounter notes, upcoming appointments, etc.  Non-urgent messages can be sent to your provider as well.   °To learn more about what you can do with MyChart, go to [email or web address].   ° °Your next appointment:   °6 month(s) ° °The format for your next appointment:   °In Person ° °Provider:   °Samuel McDowell, MD ° ° °Other Instructions °None ° ° ° ° °Thank you for choosing [facility] ! ° ° ° ° ° ° ° ° °

## 2019-12-11 IMAGING — DX DG ABDOMEN ACUTE W/ 1V CHEST
4 series · 4 of 4 positions shown · non-contrast
Comparison: Chest radiograph performed 04/24/2017

CLINICAL DATA: Acute onset of nausea.  Difficulty eating.

EXAM:
DG ABDOMEN ACUTE W/ 1V CHEST

[abdomen erect]
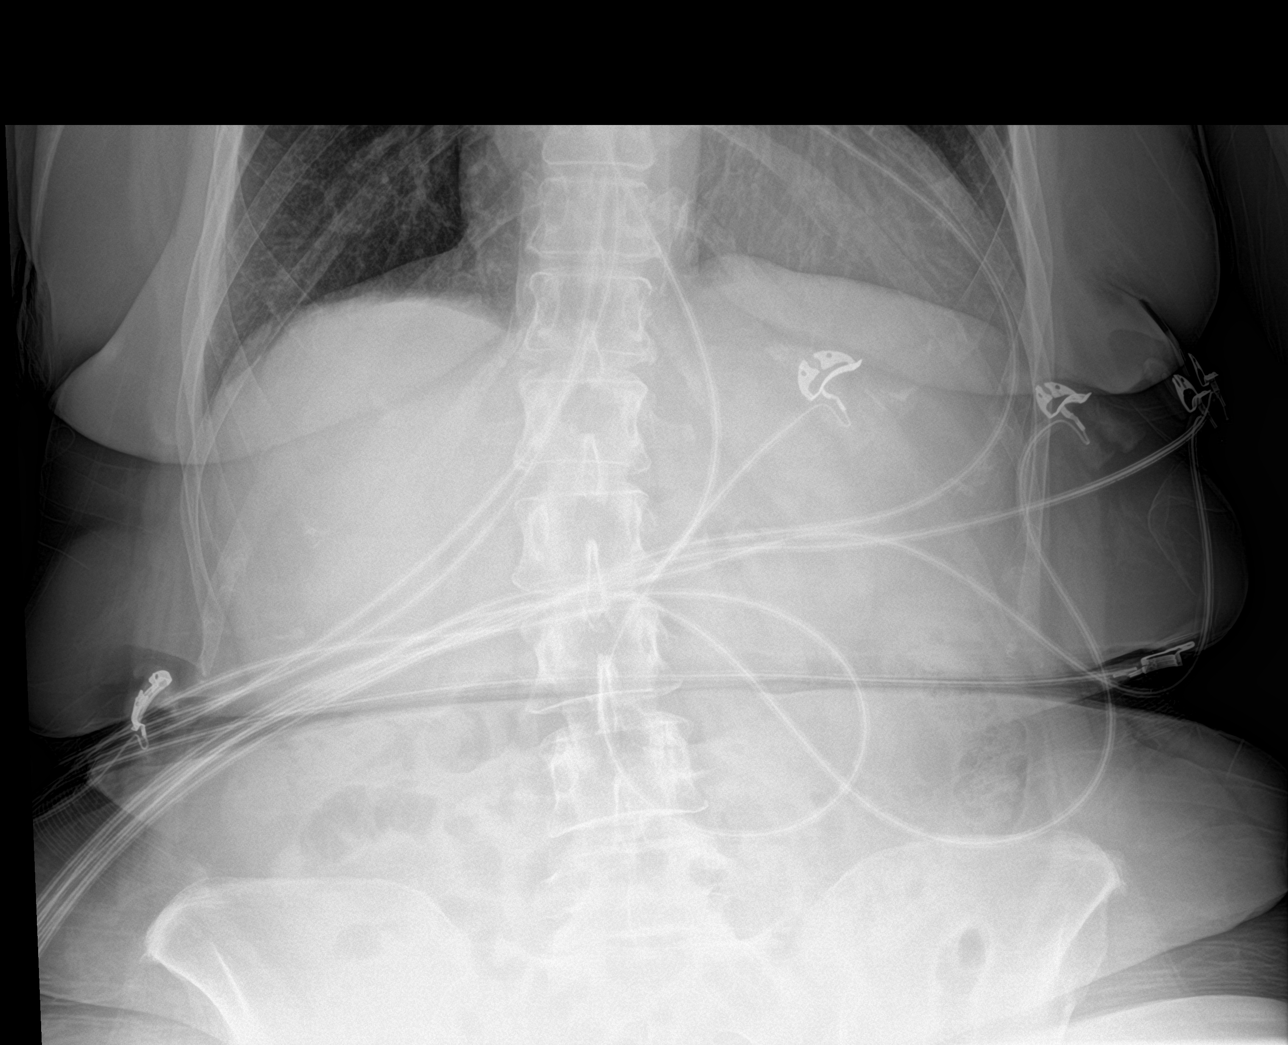

[abdomen supine (1 of 2)]
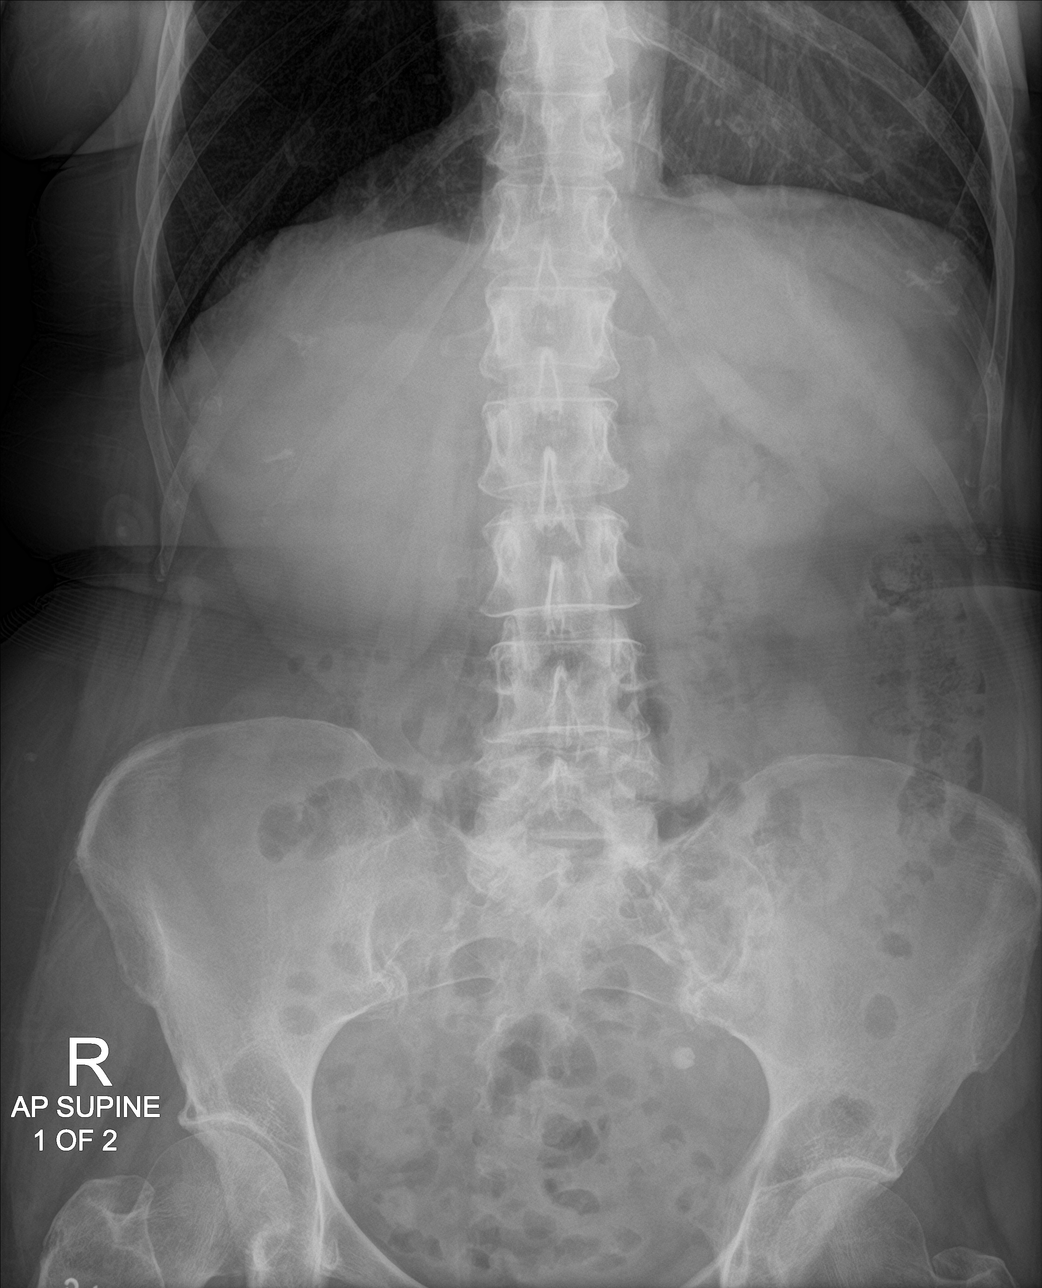

[abdomen supine (2 of 2)]
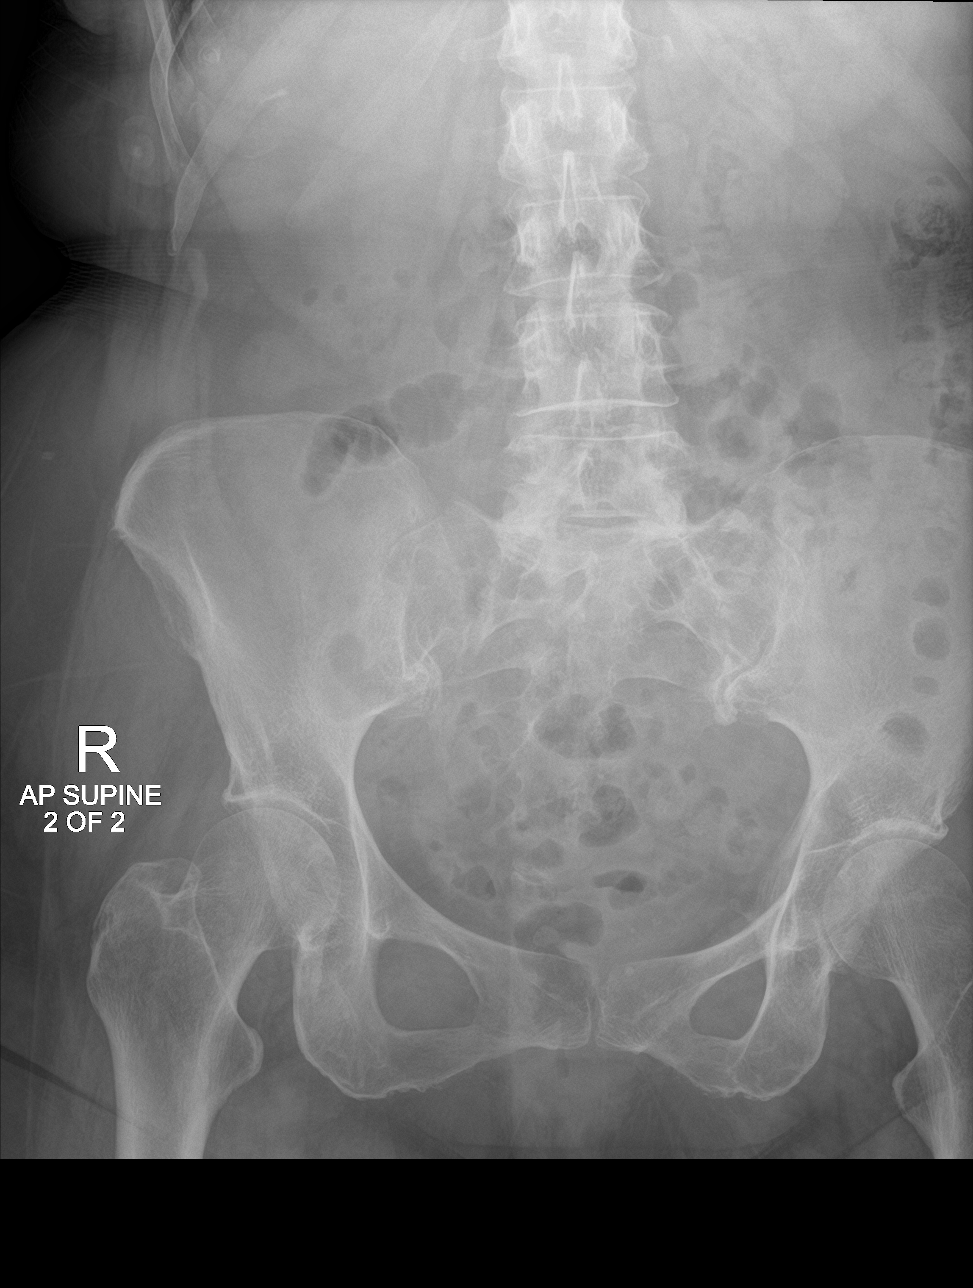

[chest ap]
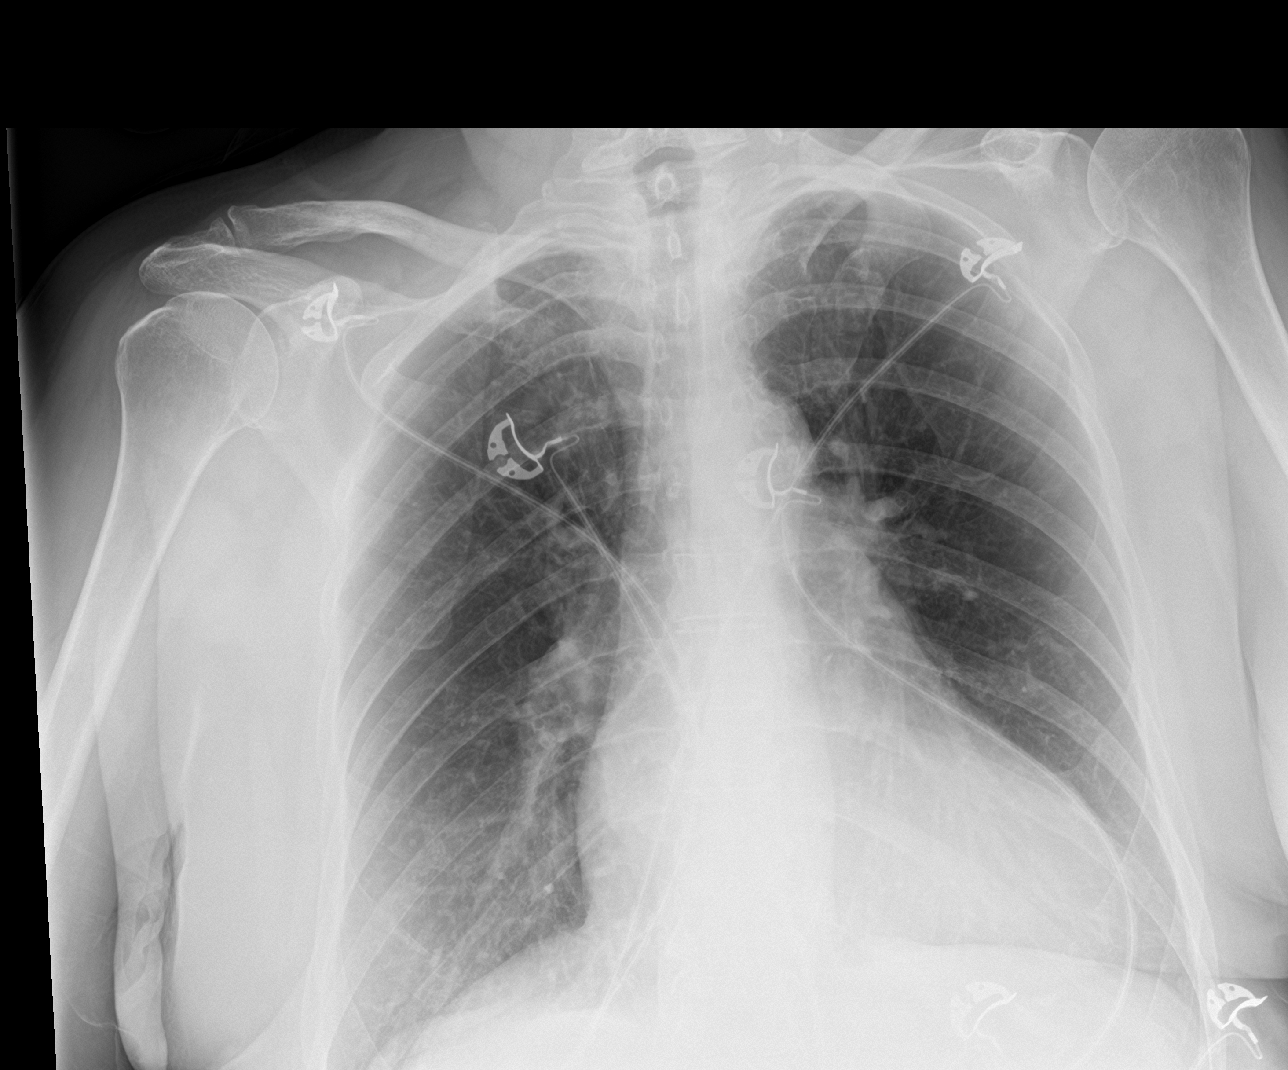

[4 of 4 positions shown; findings below may reference images not displayed]

FINDINGS: The lungs are well-aerated and clear. There is no evidence of focal
opacification, pleural effusion or pneumothorax. The
cardiomediastinal silhouette is borderline enlarged.

The visualized bowel gas pattern is unremarkable. Scattered stool
and air are seen within the colon; there is no evidence of small
bowel dilatation to suggest obstruction. No free intra-abdominal air
is identified on the provided upright view.

No acute osseous abnormalities are seen; the sacroiliac joints are
unremarkable in appearance.
IMPRESSION: 1. Unremarkable bowel gas pattern; no free intra-abdominal air seen.
Small amount of stool noted in the colon.
2. Borderline cardiomegaly.  Lungs remain grossly clear.

## 2019-12-22 ENCOUNTER — Institutional Professional Consult (permissible substitution): Payer: Medicare Other | Admitting: Critical Care Medicine

## 2019-12-31 ENCOUNTER — Other Ambulatory Visit: Payer: Self-pay | Admitting: Cardiology

## 2020-01-13 ENCOUNTER — Ambulatory Visit: Payer: Medicare Other | Admitting: Pulmonary Disease

## 2020-01-13 ENCOUNTER — Encounter: Payer: Self-pay | Admitting: Pulmonary Disease

## 2020-01-13 ENCOUNTER — Other Ambulatory Visit: Payer: Self-pay

## 2020-01-13 DIAGNOSIS — J432 Centrilobular emphysema: Secondary | ICD-10-CM | POA: Diagnosis not present

## 2020-01-13 DIAGNOSIS — J9611 Chronic respiratory failure with hypoxia: Secondary | ICD-10-CM | POA: Diagnosis not present

## 2020-01-13 DIAGNOSIS — J9610 Chronic respiratory failure, unspecified whether with hypoxia or hypercapnia: Secondary | ICD-10-CM | POA: Insufficient documentation

## 2020-01-13 MED ORDER — TRELEGY ELLIPTA 100-62.5-25 MCG/INH IN AEPB: INHALATION_SPRAY | RESPIRATORY_TRACT | 3 refills | Status: DC

## 2020-01-13 MED ORDER — ALBUTEROL SULFATE HFA 108 (90 BASE) MCG/ACT IN AERS: 1.0000 | INHALATION_SPRAY | Freq: Four times a day (QID) | RESPIRATORY_TRACT | 3 refills | Status: DC | PRN

## 2020-01-13 NOTE — Progress Notes (Signed)
Subjective:    Patient ID: Tracey Dudley, female    DOB: 21-Apr-1946, 74 y.o.   MRN: [id number]  HPI  Chief Complaint  Patient presents with  . Consult    Patient has COPD and has been doing good since last visit. Patient is on 2 liters oxygen all the time. Patient was not able to complete pulm rehab due to Covid so he bought bike for home and does that everyday. Patient has a cough lately from pollen with clear sputum.   Tracey Dudley is a 74 year old with severe COPD and chronic hypoxic respiratory failure, former patient of Dr. Ollen Bowl who presents to establish care with [place]. Her PFTs were reviewed which show severe obstructive lung disease.  She has been maintained on 2 L of oxygen for many years, DME is Regulatory affairs officer health care from [place].  She states that oxygen level stays good at rest but drops when moving.  She completed pulmonary rehab and was in the maintenance program for 3 years until Covid pandemic hit.  She obtained an elliptical and has been exercising at home by herself. She has completed all her immunizations including both Covid shots. She has been maintained on Trelegy, this is worked well for her.  She uses albuterol MDI for breakthrough  She smoked for more than 40 pack years before quitting in 2015.  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 On cardizem CD, bystolic & xarelto  Significant tests/ events reviewed  PFTs 01/2017 >> severe airway obstruction with ratio 37, FEV1 0.59/23%, FVC 1.61/49%, no bronchodilator response, TLC 134% and DLCO 36%  Echo 09/2015 normal LV function, grade 2 diastolic dysfunction  Past Medical History:  Diagnosis Date  . Anxiety   . Atrial fibrillation ([facility])    Followed by Dr. Leonie Green in [place], also [facility] EP  assessment by Dr. Christin Fudge  . COPD (chronic obstructive pulmonary disease) ([facility])   . Essential hypertension   . Herpes zoster   . Hyperlipidemia   . 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 Vitamin D deficiency    Past Surgical History:  Procedure Laterality Date  . ABDOMINAL HYSTERECTOMY    . ABLATION OF DYSRHYTHMIC FOCUS  01/02/2018  . AV NODE ABLATION N/A 01/02/2018   Procedure: AV NODE ABLATION;  Surgeon: Marinus Maw, MD;  Location: [facility] INVASIVE [facility];  Service: Cardiovascular;  Laterality: N/A;  . CATARACT EXTRACTION    . INSERT / REPLACE / REMOVE PACEMAKER  01/02/2018   DUAL CHAMBER  . PACEMAKER IMPLANT N/A 01/02/2018   Procedure: PACEMAKER IMPLANT - Dual Chamber;  Surgeon: Marinus Maw, MD;  Location: [facility] INVASIVE CV LAB;  Service: Cardiovascular;  Laterality: N/A;    Allergies  Allergen Reactions  . Metoprolol Shortness Of Breath and Other (See Comments)    Tiredness  . Erythromycin Nausea Only    Social History   Socioeconomic History  . Marital status: Married    Spouse name: Not on file  . Number of children: Not on file  . Years of education: Not on file  . Highest education level: Not on file  Occupational History  . Not on file  Tobacco Use  . Smoking status: Former Smoker    Packs/day: 0.50    Years: 30.00    Pack  years: 15.00    Types: Cigarettes    Start date: 11/03/1977    Quit date: 06/01/2014    Years since quitting: 5.6  . Smokeless tobacco: Never Used  Substance and Sexual Activity  . Alcohol use: No    Alcohol/week: 0.0 standard drinks  . Drug use: No  . Sexual activity: Yes  Other Topics Concern  . Not on file  Social History Narrative  . Not on file   Social Determinants of Health   Financial Resource Strain:   . Difficulty of Paying Living Expenses:   Food Insecurity:   . Worried About Charity fundraiser in the Last Year:   . Arboriculturist in the Last Year:   Transportation Needs:   . Film/video editor  (Medical):   Marland Kitchen Lack of Transportation (Non-Medical):   Physical Activity:   . Days of Exercise per Week:   . Minutes of Exercise per Session:   Stress:   . Feeling of Stress :   Social Connections:   . Frequency of Communication with Friends and Family:   . Frequency of Social Gatherings with Friends and Family:   . Attends Religious Services:   . Active Member of Clubs or Organizations:   . Attends Archivist Meetings:   Marland Kitchen Marital Status:   Intimate Partner Violence:   . Fear of Current or Ex-Partner:   . Emotionally Abused:   Marland Kitchen Physically Abused:   . Sexually Abused:     Family History  Problem Relation Age of Onset  . Breast cancer Sister   . CAD Father   . Asthma Mother       Review of Systems Constitutional: negative for anorexia, fevers and sweats  Eyes: negative for irritation, redness and visual disturbance  Ears, nose, mouth, throat, and face: negative for earaches, epistaxis, nasal congestion and sore throat  Respiratory: negative for  sputum and wheezing  Cardiovascular: negative for chest pain,  orthopnea, palpitations and syncope  Gastrointestinal: negative for abdominal pain, constipation, diarrhea, melena, nausea and vomiting  Genitourinary:negative for dysuria, frequency and hematuria  Hematologic/lymphatic: negative for bleeding, easy bruising and lymphadenopathy  Musculoskeletal:negative for arthralgias, muscle weakness and stiff joints  Neurological: negative for coordination problems, gait problems, headaches and weakness  Endocrine: negative for diabetic symptoms including polydipsia, polyuria and weight loss      Objective:   Physical Exam  Gen. Pleasant, obese, in no distress, normal affect ENT - no pallor,icterus, no post nasal drip, class 2-3 airway Neck: No JVD, no thyromegaly, no carotid bruits Lungs: no use of accessory muscles, no dullness to percussion, decreased without rales or rhonchi  Cardiovascular: Rhythm regular, heart  sounds  normal, no murmurs or gallops, no peripheral edema Abdomen: soft and non-tender, no hepatosplenomegaly, BS normal. Musculoskeletal: No deformities, no cyanosis or clubbing Neuro:  alert, non focal, no tremors       Assessment & Plan:

## 2020-01-13 NOTE — Assessment & Plan Note (Signed)
Refills on trelegy & pro-air We discussed signs of COPD flare - call us for chest cold, wheezing or shortness of breath

## 2020-01-13 NOTE — Assessment & Plan Note (Signed)
She is very compliant with oxygen and this is certainly helped her. We will take over oxygen certification for DME purposes

## 2020-01-13 NOTE — Patient Instructions (Addendum)
Refills on trelegy & pro-air We discussed signs of COPD flare - call us for chest cold, wheezing or shortness of breath We will take over oxygen certification as needed

## 2020-02-10 ENCOUNTER — Ambulatory Visit (INDEPENDENT_AMBULATORY_CARE_PROVIDER_SITE_OTHER): Payer: Medicare Other | Admitting: *Deleted

## 2020-02-10 DIAGNOSIS — I4819 Other persistent atrial fibrillation: Secondary | ICD-10-CM | POA: Diagnosis not present

## 2020-02-10 LAB — CUP PACEART REMOTE DEVICE CHECK
Battery Remaining Longevity: 134 mo
Battery Voltage: 3.01 V
Brady Statistic AP VP Percent: 0 %
Brady Statistic AP VS Percent: 0 %
Brady Statistic AS VP Percent: 35.57 %
Brady Statistic AS VS Percent: 64.43 %
Brady Statistic RA Percent Paced: 0 %
Brady Statistic RV Percent Paced: 35.57 %
Date Time Interrogation Session: 20210526051434
Implantable Lead Implant Date: 20190418
Implantable Lead Implant Date: 20190418
Implantable Lead Location: 753859
Implantable Lead Location: 753860
Implantable Lead Model: 3830
Implantable Lead Model: 5076
Implantable Pulse Generator Implant Date: 20190418
Lead Channel Impedance Value: 323 Ohm
Lead Channel Impedance Value: 399 Ohm
Lead Channel Impedance Value: 475 Ohm
Lead Channel Impedance Value: 513 Ohm
Lead Channel Pacing Threshold Amplitude: 0.625 V
Lead Channel Pacing Threshold Amplitude: 1.75 V
Lead Channel Pacing Threshold Pulse Width: 0.4 ms
Lead Channel Pacing Threshold Pulse Width: 0.4 ms
Lead Channel Sensing Intrinsic Amplitude: 1.625 mV
Lead Channel Sensing Intrinsic Amplitude: 2.625 mV
Lead Channel Sensing Intrinsic Amplitude: 3.625 mV
Lead Channel Sensing Intrinsic Amplitude: 3.625 mV
Lead Channel Setting Pacing Amplitude: 2.5 V
Lead Channel Setting Pacing Pulse Width: 1 ms
Lead Channel Setting Sensing Sensitivity: 1.2 mV

## 2020-02-11 NOTE — Progress Notes (Signed)
Remote pacemaker transmission.   

## 2020-02-17 ENCOUNTER — Telehealth: Payer: Self-pay | Admitting: Pulmonary Disease

## 2020-02-17 MED ORDER — DOXYCYCLINE HYCLATE 100 MG PO TABS: 100.0000 mg | ORAL_TABLET | Freq: Two times a day (BID) | ORAL | 0 refills | Status: DC

## 2020-02-17 MED ORDER — PREDNISONE 10 MG PO TABS: ORAL_TABLET | ORAL | 0 refills | Status: DC

## 2020-02-17 NOTE — Telephone Encounter (Signed)
Called and spoke with pt letting her know that we were going to send pred Rx to pharmacy for her and she verbalized understanding. Verified preferred pharmacy and sent Rx in for pt. Nothing further needed.

## 2020-02-17 NOTE — Telephone Encounter (Signed)
Doxycycline 100 mg daily for 7 days 

## 2020-02-17 NOTE — Telephone Encounter (Signed)
Spoke with patient. She is aware of the doxy and instructions. She wanted to know if RA would also call in prednisone for her as well as this has worked well for her in the past with antibiotics.   RA, please advise. Thanks!

## 2020-02-17 NOTE — Telephone Encounter (Signed)
Spoke with patient. She stated that she was seen by RA on 01/13/20 and was advised to call anytime she felt like she was having a COPD exacerbation. She feels like she has having one now. She has a productive cough with yellow phlegm. Slight increase in SOB. She denied any chest pain or back pain. Also denied having any fevers or chills.   She wants to know if she could have an antibiotic called in for her.   [facility] [place].   RA, please advise. Thanks!

## 2020-02-17 NOTE — Telephone Encounter (Signed)
Prednisone 10 mg tabs  Take 2 tabs daily with food x 5ds, then 1 tab daily with food x 5ds then STOP  

## 2020-03-11 ENCOUNTER — Encounter (INDEPENDENT_AMBULATORY_CARE_PROVIDER_SITE_OTHER): Payer: Self-pay | Admitting: *Deleted

## 2020-04-03 ENCOUNTER — Other Ambulatory Visit: Payer: Self-pay | Admitting: Cardiology

## 2020-04-05 NOTE — Telephone Encounter (Signed)
losartan (COZAAR) 100 MG tablet       Changed from: Azilsartan Medoxomil (EDARBI) 80 MG TABS   All pharmacy suggested alternatives are listed below   Sig: N/A   Disp:  30 tablet  Refills:  0   Start: 04/03/2020   Class: Normal   Non-formulary   Last ordered: 3 months ago by Jonelle Sidle, MD    Rx #: [id number]   Pharmacy comment: Alternative Requested:PT PAYING $64 FOR EDARB. PT REQUESTING LOWER-COST ALTERNATIVE: LOSAR. $3; VALSA. $11; IRBES. $12; OLMES. $12; TELMI. $12;  All Pharmacy Suggested Alternatives:   0 losartan (COZAAR) 100 MG tablet 0 valsartan (DIOVAN) 320 MG tablet 0 irbesartan (AVAPRO) 300 MG tablet 0 olmesartan (BENICAR) 40 MG tablet 0 telmisartan (MICARDIS) 80 MG tablet  To prescribe one of the alternatives listed above, open the encounter and click Replace. Open Encounter     [id number] [place] [place] - [address]   Received this from pharmacy.patient  want to stop Edarbi and replace due to cost.(pt pays $64)     Please advise

## 2020-04-05 NOTE — Telephone Encounter (Signed)
I presume that her PCP ordered Edarbi since that is not usually a medication that I prescribe.  It would be reasonable to switch to losartan however at an equivalent dose directed by pharmacy.

## 2020-04-11 ENCOUNTER — Other Ambulatory Visit: Payer: Self-pay | Admitting: Cardiology

## 2020-05-11 ENCOUNTER — Ambulatory Visit (INDEPENDENT_AMBULATORY_CARE_PROVIDER_SITE_OTHER): Payer: Medicare Other | Admitting: *Deleted

## 2020-05-11 DIAGNOSIS — I4819 Other persistent atrial fibrillation: Secondary | ICD-10-CM

## 2020-05-12 LAB — CUP PACEART REMOTE DEVICE CHECK
Battery Remaining Longevity: 133 mo
Battery Voltage: 3.01 V
Brady Statistic AP VP Percent: 0 %
Brady Statistic AP VS Percent: 0 %
Brady Statistic AS VP Percent: 27.09 %
Brady Statistic AS VS Percent: 72.91 %
Brady Statistic RA Percent Paced: 0 %
Brady Statistic RV Percent Paced: 27.09 %
Date Time Interrogation Session: 20210824211156
Implantable Lead Implant Date: 20190418
Implantable Lead Implant Date: 20190418
Implantable Lead Location: 753859
Implantable Lead Location: 753860
Implantable Lead Model: 3830
Implantable Lead Model: 5076
Implantable Pulse Generator Implant Date: 20190418
Lead Channel Impedance Value: 323 Ohm
Lead Channel Impedance Value: 361 Ohm
Lead Channel Impedance Value: 475 Ohm
Lead Channel Impedance Value: 589 Ohm
Lead Channel Pacing Threshold Amplitude: 0.625 V
Lead Channel Pacing Threshold Amplitude: 1.75 V
Lead Channel Pacing Threshold Pulse Width: 0.4 ms
Lead Channel Pacing Threshold Pulse Width: 0.4 ms
Lead Channel Sensing Intrinsic Amplitude: 1.625 mV
Lead Channel Sensing Intrinsic Amplitude: 2.625 mV
Lead Channel Sensing Intrinsic Amplitude: 4 mV
Lead Channel Sensing Intrinsic Amplitude: 4 mV
Lead Channel Setting Pacing Amplitude: 2.5 V
Lead Channel Setting Pacing Pulse Width: 1 ms
Lead Channel Setting Sensing Sensitivity: 1.2 mV

## 2020-05-17 NOTE — Progress Notes (Signed)
Remote pacemaker transmission.   

## 2020-05-18 ENCOUNTER — Encounter (INDEPENDENT_AMBULATORY_CARE_PROVIDER_SITE_OTHER): Payer: Self-pay | Admitting: *Deleted

## 2020-05-18 ENCOUNTER — Other Ambulatory Visit (INDEPENDENT_AMBULATORY_CARE_PROVIDER_SITE_OTHER): Payer: Self-pay | Admitting: *Deleted

## 2020-05-18 ENCOUNTER — Telehealth (INDEPENDENT_AMBULATORY_CARE_PROVIDER_SITE_OTHER): Payer: Self-pay | Admitting: *Deleted

## 2020-05-18 MED ORDER — SUTAB 1479-225-188 MG PO TABS: 1.0000 | ORAL_TABLET | Freq: Once | ORAL | 0 refills | Status: AC

## 2020-05-18 NOTE — Telephone Encounter (Signed)
Tracey Dudley 10-31-1945   Request for pharmacy clearance to hold medication prior to procedure:   . Procedure to be performed? colonoscopy  . Procedure Date: 06/28/20   . Medications that need to be held prior to procedure and how long?   xarelto 2 days priep   . Name of physician performing procedure?  Dr Levon Hedger   . Anesthesia type? Moderate (propofol)

## 2020-05-18 NOTE — Telephone Encounter (Signed)
Patient needs Sutab (copay card) ° °

## 2020-05-18 NOTE — Telephone Encounter (Signed)
   Primary Cardiologist: Nona Dell, MD  Chart reviewed as part of pre-operative protocol coverage. Patient was contacted 05/18/2020 in reference to pre-operative risk assessment for pending surgery as outlined below.  Tracey Dudley was last seen on 12/08/19 by Dr. Diona Browner, history outlined with atrial fibrillation, incomplete AVN ablation s/p PPM, RBBB, anxiety, HTN, HLD, severe COPD with chronic hypoxic respiratory failure. Last EF 75-80% by echo 2017. Chart also indicates h/o home O2 use. RCRI 0.4% indicating low risk of CV complications. The patient reports she's been feeling fine from a cardiac standpoint. Therefore, based on ACC/AHA guidelines, the patient would be at acceptable risk for the planned procedure from cardiac standpoint without further cardiovascular testing. The patient was advised that if she develops new symptoms prior to surgery to contact our office to arrange for a follow-up visit, and she verbalized understanding.  Will route to pharm for input on anticoag.  Laurann Montana, PA-C 05/18/2020, 3:32 PM

## 2020-05-18 NOTE — Telephone Encounter (Signed)
Patient with diagnosis of afib on Xarelto for anticoagulation.    Procedure: colonoscopy Date of procedure: 06/28/20  CHADS2-VASc score of  3 ( HTN, AGE, female)  CrCl 44 ml/min  Per office protocol, patient can hold Xarelto for 2 days prior to procedure.

## 2020-05-19 NOTE — Telephone Encounter (Signed)
Thanks  Layney Gillson Castaneda, MD Gastroenterology and Hepatology [facility]  

## 2020-05-19 NOTE — Telephone Encounter (Signed)
Message has been routed via Epic to Luster Landsberg and Dr. Levon Hedger via [facility] [address] [place],  [place]  [address]  Main: [phone]

## 2020-05-19 NOTE — Telephone Encounter (Signed)
Callback pool to find out the fax number of the requesting provider and fax this clearance back. I have already called the patient and gave her the instruction regarding how to hold Xarelto prior to the procedure.

## 2020-05-25 ENCOUNTER — Encounter (INDEPENDENT_AMBULATORY_CARE_PROVIDER_SITE_OTHER): Payer: Self-pay | Admitting: *Deleted

## 2020-05-27 ENCOUNTER — Other Ambulatory Visit (INDEPENDENT_AMBULATORY_CARE_PROVIDER_SITE_OTHER): Payer: Self-pay

## 2020-05-27 DIAGNOSIS — Z1211 Encounter for screening for malignant neoplasm of colon: Secondary | ICD-10-CM

## 2020-05-28 ENCOUNTER — Other Ambulatory Visit: Payer: Self-pay | Admitting: Pulmonary Disease

## 2020-05-30 ENCOUNTER — Telehealth (INDEPENDENT_AMBULATORY_CARE_PROVIDER_SITE_OTHER): Payer: Self-pay | Admitting: *Deleted

## 2020-05-30 NOTE — Telephone Encounter (Signed)
Ok to schedule.  Thanks,  Tracey Stidd Castaneda Mayorga, MD Gastroenterology and Hepatology [facility]  

## 2020-05-30 NOTE — Telephone Encounter (Signed)
Referring MD/PCP: [email or web address]   Procedure: tcs  Reason/Indication:  screening  Has patient had this procedure before?  Yes, 10*12 yrs ago  If so, when, by whom and where?    Is there a family history of colon cancer?  no  Who?  What age when diagnosed?    Is patient diabetic?   no      Does patient have prosthetic heart valve or mechanical valve?  no  Do you have a pacemaker/defibrillator?  no  Has patient ever had endocarditis/atrial fibrillation? Yes, atril fib  Does patient use oxygen? yes  Has patient had joint replacement within last 12 months?  no  Is patient constipated or do they take laxatives? no  Does patient have a history of alcohol/drug use?  no  Is patient on blood thinner such as Coumadin, Plavix and/or Aspirin? yes  Medications: xarelto 20 mg daily, edarbi 80 mg daily, bystolic 20 mg daily, spironolactone 25 mg 1/2 tab daily, diltiazem 120 mg bid, atorvastatin 20 mg daily, alprazolam 0.5 mg tid prn, trelegy 100 mg daily, proair 90 mcg prn  Allergies: metoprolol  Medication Adjustment per Dr Rehman/Dr Levon Hedger xarelto 2 days  Procedure date & time: 07/01/20

## 2020-06-28 NOTE — Patient Instructions (Signed)
Tracey Dudley  06/28/2020     @PREFPERIOPPHARMACY @   Your procedure is scheduled on  07/01/2020  Report to 07/03/2020 at  [facility]  A.M.  Call this number if you have problems the morning of surgery:  [phone]   Remember:  Follow the diet and prep instructions given to you by the office.                      Take these medicines the morning of surgery with A SIP OF WATER  Xanax(if needed), edarbi, bystolic, cardiazem, paxil.    Do not wear jewelry, make-up or nail polish.  Do not wear lotions, powders, or perfumes. Please wear deodorant and brush your teeth.  Do not shave 48 hours prior to surgery.  Men may shave face and neck.  Do not bring valuables to the hospital.  [facility] is not responsible for any belongings or valuables.  Contacts, dentures or bridgework may not be worn into surgery.  Leave your suitcase in the car.  After surgery it may be brought to your room.  For patients admitted to the hospital, discharge time will be determined by your treatment team.  Patients discharged the day of surgery will not be allowed to drive home.   Name and phone number of your driver:   family Special instructions:  DO NOT smoke the morning of your procedure.  Please read over the following fact sheets that you were given. Anesthesia Post-op Instructions and Care and Recovery After Surgery       Colonoscopy, Adult, Care After This sheet gives you information about how to care for yourself after your procedure. Your health care provider may also give you more specific instructions. If you have problems or questions, contact your health care provider. What can I expect after the procedure? After the procedure, it is common to have:  A small amount of blood in your stool for 24 hours after the procedure.  Some gas.  Mild cramping or bloating of your abdomen. Follow these instructions at home: Eating and drinking   Drink enough fluid to keep your urine pale  yellow.  Follow instructions from your health care provider about eating or drinking restrictions.  Resume your normal diet as instructed by your health care provider. Avoid heavy or fried foods that are hard to digest. Activity  Rest as told by your health care provider.  Avoid sitting for a long time without moving. Get up to take short walks every 1-2 hours. This is important to improve blood flow and breathing. Ask for help if you feel weak or unsteady.  Return to your normal activities as told by your health care provider. Ask your health care provider what activities are safe for you. Managing cramping and bloating   Try walking around when you have cramps or feel bloated.  Apply heat to your abdomen as told by your health care provider. Use the heat source that your health care provider recommends, such as a moist heat pack or a heating pad. ? Place a towel between your skin and the heat source. ? Leave the heat on for 20-30 minutes. ? Remove the heat if your skin turns bright red. This is especially important if you are unable to feel pain, heat, or cold. You may have a greater risk of getting burned. General instructions  For the first 24 hours after the procedure: ? Do not drive or use machinery. ?  Do not sign important documents. ? Do not drink alcohol. ? Do your regular daily activities at a slower pace than normal. ? Eat soft foods that are easy to digest.  Take over-the-counter and prescription medicines only as told by your health care provider.  Keep all follow-up visits as told by your health care provider. This is important. Contact a health care provider if:  You have blood in your stool 2-3 days after the procedure. Get help right away if you have:  More than a small spotting of blood in your stool.  Large blood clots in your stool.  Swelling of your abdomen.  Nausea or vomiting.  A fever.  Increasing pain in your abdomen that is not relieved with  medicine. Summary  After the procedure, it is common to have a small amount of blood in your stool. You may also have mild cramping and bloating of your abdomen.  For the first 24 hours after the procedure, do not drive or use machinery, sign important documents, or drink alcohol.  Get help right away if you have a lot of blood in your stool, nausea or vomiting, a fever, or increased pain in your abdomen. This information is not intended to replace advice given to you by your health care provider. Make sure you discuss any questions you have with your health care provider. Document Revised: 03/30/2019 Document Reviewed: 03/30/2019 Elsevier Patient Education  [place] After These instructions provide you with information about caring for yourself after your procedure. Your health care provider may also give you more specific instructions. Your treatment has been planned according to current medical practices, but problems sometimes occur. Call your health care provider if you have any problems or questions after your procedure. What can I expect after the procedure? After your procedure, you may:  Feel sleepy for several hours.  Feel clumsy and have poor balance for several hours.  Feel forgetful about what happened after the procedure.  Have poor judgment for several hours.  Feel nauseous or vomit.  Have a sore throat if you had a breathing tube during the procedure. Follow these instructions at home: For at least 24 hours after the procedure:      Have a responsible adult stay with you. It is important to have someone help care for you until you are awake and alert.  Rest as needed.  Do not: ? Participate in activities in which you could fall or become injured. ? Drive. ? Use heavy machinery. ? Drink alcohol. ? Take sleeping pills or medicines that cause drowsiness. ? Make important decisions or sign legal documents. ? Take care  of children on your own. Eating and drinking  Follow the diet that is recommended by your health care provider.  If you vomit, drink water, juice, or soup when you can drink without vomiting.  Make sure you have little or no nausea before eating solid foods. General instructions  Take over-the-counter and prescription medicines only as told by your health care provider.  If you have sleep apnea, surgery and certain medicines can increase your risk for breathing problems. Follow instructions from your health care provider about wearing your sleep device: ? Anytime you are sleeping, including during daytime naps. ? While taking prescription pain medicines, sleeping medicines, or medicines that make you drowsy.  If you smoke, do not smoke without supervision.  Keep all follow-up visits as told by your health care provider. This is important. Contact a health  care provider if:  You keep feeling nauseous or you keep vomiting.  You feel light-headed.  You develop a rash.  You have a fever. Get help right away if:  You have trouble breathing. Summary  For several hours after your procedure, you may feel sleepy and have poor judgment.  Have a responsible adult stay with you for at least 24 hours or until you are awake and alert. This information is not intended to replace advice given to you by your health care provider. Make sure you discuss any questions you have with your health care provider. Document Revised: 12/02/2017 Document Reviewed: 12/25/2015 Elsevier Patient Education  [place].

## 2020-06-29 ENCOUNTER — Other Ambulatory Visit: Payer: Self-pay

## 2020-06-29 ENCOUNTER — Other Ambulatory Visit (HOSPITAL_COMMUNITY)
Admission: RE | Admit: 2020-06-29 | Discharge: 2020-06-29 | Disposition: A | Discharge status: Home / Self Care | Payer: Medicare Other | Source: Ambulatory Visit | Attending: Gastroenterology | Admitting: Gastroenterology

## 2020-06-29 ENCOUNTER — Encounter (HOSPITAL_COMMUNITY)
Admission: RE | Admit: 2020-06-29 | Discharge: 2020-06-29 | Disposition: A | Discharge status: Home / Self Care | Payer: Medicare Other | Source: Ambulatory Visit | Attending: Gastroenterology | Admitting: Gastroenterology

## 2020-06-29 ENCOUNTER — Encounter (HOSPITAL_COMMUNITY): Payer: Self-pay

## 2020-06-29 DIAGNOSIS — Z20822 Contact with and (suspected) exposure to covid-19: Secondary | ICD-10-CM | POA: Diagnosis not present

## 2020-06-29 DIAGNOSIS — Z01812 Encounter for preprocedural laboratory examination: Secondary | ICD-10-CM | POA: Insufficient documentation

## 2020-06-29 DIAGNOSIS — Z1211 Encounter for screening for malignant neoplasm of colon: Secondary | ICD-10-CM

## 2020-06-29 HISTORY — DX: Dyspnea, unspecified: R06.00

## 2020-06-30 LAB — SARS CORONAVIRUS 2 (TAT 6-24 HRS): SARS Coronavirus 2: NEGATIVE

## 2020-07-01 ENCOUNTER — Encounter (HOSPITAL_COMMUNITY)
Admission: RE | Disposition: A | Discharge status: Home / Self Care | Payer: Self-pay | Source: Home / Self Care | Attending: Gastroenterology

## 2020-07-01 ENCOUNTER — Ambulatory Visit (HOSPITAL_COMMUNITY)
Admission: RE | Admit: 2020-07-01 | Discharge: 2020-07-01 | Disposition: A | Discharge status: Home / Self Care | Payer: Medicare Other | Attending: Gastroenterology | Admitting: Gastroenterology

## 2020-07-01 ENCOUNTER — Other Ambulatory Visit: Payer: Self-pay

## 2020-07-01 ENCOUNTER — Ambulatory Visit (HOSPITAL_COMMUNITY): Payer: Medicare Other | Admitting: Anesthesiology

## 2020-07-01 ENCOUNTER — Encounter (HOSPITAL_COMMUNITY): Payer: Self-pay | Admitting: Gastroenterology

## 2020-07-01 DIAGNOSIS — K648 Other hemorrhoids: Secondary | ICD-10-CM | POA: Diagnosis not present

## 2020-07-01 DIAGNOSIS — Z79899 Other long term (current) drug therapy: Secondary | ICD-10-CM | POA: Diagnosis not present

## 2020-07-01 DIAGNOSIS — F419 Anxiety disorder, unspecified: Secondary | ICD-10-CM | POA: Diagnosis not present

## 2020-07-01 DIAGNOSIS — I4891 Unspecified atrial fibrillation: Secondary | ICD-10-CM | POA: Diagnosis not present

## 2020-07-01 DIAGNOSIS — J449 Chronic obstructive pulmonary disease, unspecified: Secondary | ICD-10-CM | POA: Diagnosis not present

## 2020-07-01 DIAGNOSIS — Z950 Presence of cardiac pacemaker: Secondary | ICD-10-CM | POA: Diagnosis not present

## 2020-07-01 DIAGNOSIS — I10 Essential (primary) hypertension: Secondary | ICD-10-CM | POA: Diagnosis not present

## 2020-07-01 DIAGNOSIS — Z888 Allergy status to other drugs, medicaments and biological substances status: Secondary | ICD-10-CM | POA: Diagnosis not present

## 2020-07-01 DIAGNOSIS — E785 Hyperlipidemia, unspecified: Secondary | ICD-10-CM | POA: Diagnosis not present

## 2020-07-01 DIAGNOSIS — Z7901 Long term (current) use of anticoagulants: Secondary | ICD-10-CM | POA: Diagnosis not present

## 2020-07-01 DIAGNOSIS — Z87891 Personal history of nicotine dependence: Secondary | ICD-10-CM | POA: Insufficient documentation

## 2020-07-01 DIAGNOSIS — Z1211 Encounter for screening for malignant neoplasm of colon: Secondary | ICD-10-CM | POA: Insufficient documentation

## 2020-07-01 DIAGNOSIS — Z881 Allergy status to other antibiotic agents status: Secondary | ICD-10-CM | POA: Diagnosis not present

## 2020-07-01 HISTORY — PX: COLONOSCOPY WITH PROPOFOL: SHX5780

## 2020-07-01 LAB — HM COLONOSCOPY

## 2020-07-01 SURGERY — COLONOSCOPY WITH PROPOFOL
Anesthesia: General

## 2020-07-01 MED ORDER — PROPOFOL 10 MG/ML IV BOLUS
INTRAVENOUS | Status: DC | PRN
  Administered 2020-07-01: 50 mg via INTRAVENOUS
  Administered 2020-07-01: 20 mg via INTRAVENOUS

## 2020-07-01 MED ORDER — LACTATED RINGERS IV SOLN: INTRAVENOUS | Status: DC | PRN

## 2020-07-01 MED ORDER — PROPOFOL 500 MG/50ML IV EMUL
INTRAVENOUS | Status: DC | PRN
  Administered 2020-07-01: 75 ug/kg/min via INTRAVENOUS

## 2020-07-01 MED ORDER — LIDOCAINE HCL (CARDIAC) PF 50 MG/5ML IV SOSY
PREFILLED_SYRINGE | INTRAVENOUS | Status: DC | PRN
  Administered 2020-07-01: 60 mg via INTRAVENOUS

## 2020-07-01 MED ORDER — LACTATED RINGERS IV SOLN: Freq: Once | INTRAVENOUS | Status: AC

## 2020-07-01 MED ORDER — STERILE WATER FOR IRRIGATION IR SOLN
Status: DC | PRN
  Administered 2020-07-01: 1.5 mL

## 2020-07-01 NOTE — Anesthesia Postprocedure Evaluation (Signed)
Anesthesia Post Note  Patient: Tracey Dudley  Procedure(s) Performed: COLONOSCOPY WITH PROPOFOL (N/A )  Patient location during evaluation: PACU Anesthesia Type: General Level of consciousness: awake and alert Pain management: satisfactory to patient Vital Signs Assessment: post-procedure vital signs reviewed and stable Respiratory status: spontaneous breathing and patient connected to nasal cannula oxygen Cardiovascular status: stable Postop Assessment: no apparent nausea or vomiting Anesthetic complications: no   No complications documented.   Last Vitals:  Vitals:   07/01/20 0930 07/01/20 0945  BP: 110/72 126/71  Pulse: 69 63  Resp: 19 14  Temp: (!) 36.3 C   SpO2: 97% 100%    Last Pain:  Vitals:   07/01/20 0945  TempSrc:   PainSc: 0-No pain                 Beuna Bolding

## 2020-07-01 NOTE — H&P (Signed)
Tracey Dudley is an 74 y.o. female.   Chief Complaint: screening colonoscopy HPI: 74 year old female with past medical history of atrial fibrillation, COPD, hypertension, hyperlipidemia , who comes to the hospital to undergo screening colonoscopy.  The patient had a colonoscopy 12 years ago, no polyps per patient She denies having any complaints such as melena, hematochezia, abdominal pain or distention, change in her bowel movement consistency or frequency, no changes in her weight recently.  No family history of colorectal cancer.   Past Medical History:  Diagnosis Date  . Anxiety   . Atrial fibrillation (HCC)    Followed by Dr. Leonie Green in [place], also [facility] EP assessment by Dr. Christin Fudge  . COPD (chronic obstructive pulmonary disease) (HCC)   . Dyspnea    at times on exertion  . Essential hypertension   . Herpes zoster   . Hyperlipidemia   . 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 Vitamin D deficiency     Past Surgical History:  Procedure Laterality Date  . ABDOMINAL HYSTERECTOMY    . ABLATION OF DYSRHYTHMIC FOCUS  01/02/2018  . AV NODE ABLATION N/A 01/02/2018   Procedure: AV NODE ABLATION;  Surgeon: Marinus Maw, MD;  Location: [facility] INVASIVE CV LAB;  Service: Cardiovascular;  Laterality: N/A;  . CATARACT EXTRACTION    . INSERT / REPLACE / REMOVE PACEMAKER  01/02/2018   DUAL CHAMBER  . PACEMAKER IMPLANT N/A 01/02/2018   Procedure: PACEMAKER IMPLANT - Dual Chamber;  Surgeon: Marinus Maw, MD;  Location: [facility] INVASIVE CV LAB;  Service: Cardiovascular;  Laterality: N/A;    Family History  Problem Relation Age of Onset  . Breast cancer Sister   . CAD Father   . Asthma Mother    Social History:  reports that she quit smoking about 6 years ago. Her smoking use included cigarettes. She started smoking about 42 years ago. She has a 15.00 pack-year smoking history. She has never used smokeless tobacco. She reports that she does not drink alcohol and does not use  drugs.  Allergies:  Allergies  Allergen Reactions  . Metoprolol Shortness Of Breath and Other (See Comments)    Tiredness  . Erythromycin Nausea Only    Medications Prior to Admission  Medication Sig Dispense Refill  . acetaminophen (TYLENOL) 500 MG tablet Take 500 mg by mouth every 6 (six) hours as needed for moderate pain or headache.     . albuterol (VENTOLIN HFA) 108 (90 Base) MCG/ACT inhaler Inhale 1 puff into the lungs every 6 (six) hours as needed for wheezing or shortness of breath. 18 g 3  . ALPRAZolam (XANAX) 0.5 MG tablet Take 0.5 mg by mouth 3 (three) times daily as needed for anxiety.     Marland Kitchen atorvastatin (LIPITOR) 20 MG tablet Take 20 mg by mouth every evening.     . Azilsartan Medoxomil (EDARBI) 80 MG TABS TAKE 1 TABLET BY MOUTH EVERY DAY (Patient taking differently: Take 80 mg by mouth daily. ) 90 tablet 3  . BYSTOLIC 20 MG TABS TAKE 1 TABLET BY MOUTH EVERY DAY (Patient taking differently: Take 20 mg by mouth daily. ) 90 tablet 2  . diltiazem (CARDIZEM CD) 120 MG 24 hr capsule Take 1 capsule (120 mg total) by mouth 2 (two) times daily. 180 capsule 3  . OXYGEN Inhale 2 L into the lungs continuous.    Marland Kitchen PARoxetine (PAXIL) 20 MG tablet Take 20 mg by mouth daily.     Marland Kitchen spironolactone (ALDACTONE) 25 MG tablet  TAKE 1/2 TABLET (12.5MG ) BY MOUTH EVERY DAY (Patient taking differently: Take 12.5 mg by mouth daily. ) 135 tablet 2  . TRELEGY ELLIPTA 100-62.5-25 MCG/INH AEPB INHALE 1 PUFF BY MOUTH EVERY DAY (Patient taking differently: Inhale 1 puff into the lungs daily. ) 60 each 3  . triamcinolone cream (KENALOG) 0.1 % Apply 1 application topically 2 (two) times daily.     Carlena Hurl 20 MG TABS tablet TAKE 1 TABLET BY MOUTH WITH FOOD EVERY DAY (Patient taking differently: Take 20 mg by mouth daily with supper. ) 90 tablet 2  . doxycycline (VIBRA-TABS) 100 MG tablet Take 1 tablet (100 mg total) by mouth 2 (two) times daily. (Patient not taking: Reported on 06/21/2020) 14 tablet 0  .  halobetasol (ULTRAVATE) 0.05 % ointment APPLY TO AFFECTED AREA EVERY DAY (Patient not taking: Reported on 06/21/2020)  6  . losartan (COZAAR) 100 MG tablet Pt to stop Edarbi (Patient not taking: Reported on 06/21/2020) 30 tablet 6  . predniSONE (DELTASONE) 10 MG tablet Take 2tabsx5dayswith food then 1tabx5dayswith food, then stop (Patient not taking: Reported on 06/21/2020) 15 tablet 0    Results for orders placed or performed during the hospital encounter of 06/29/20 (from the past 48 hour(s))  SARS CORONAVIRUS 2 (TAT 6-24 HRS) Nasopharyngeal Nasopharyngeal Swab     Status: None   Collection Time: 06/29/20  2:15 PM   Specimen: Nasopharyngeal Swab  Result Value Ref Range   SARS Coronavirus 2 NEGATIVE NEGATIVE    Comment: (NOTE) SARS-CoV-2 target nucleic acids are NOT DETECTED.  The SARS-CoV-2 RNA is generally detectable in upper and lower respiratory specimens during the acute phase of infection. Negative results do not preclude SARS-CoV-2 infection, do not rule out co-infections with other pathogens, and should not be used as the sole basis for treatment or other patient management decisions. Negative results must be combined with clinical observations, patient history, and epidemiological information. The expected result is Negative.  Fact Sheet for Patients: HairSlick.no  Fact Sheet for Healthcare Providers: quierodirigir.com  This test is not yet approved or cleared by the [place] FDA and  has been authorized for detection and/or diagnosis of SARS-CoV-2 by FDA under an Emergency Use Authorization (EUA). This EUA will remain  in effect (meaning this test can be used) for the duration of the COVID-19 declaration under Se ction 564(b)(1) of the Act, 21 U.S.C. section 360bbb-3(b)(1), unless the authorization is terminated or revoked sooner.  Performed at [facility], [address], [place], [place] [address]     No results found.  Review of Systems  Constitutional: Negative.   HENT: Negative.   Eyes: Negative.   Respiratory: Negative.   Cardiovascular: Negative.   Gastrointestinal: Negative.   Endocrine: Negative.   Genitourinary: Negative.   Musculoskeletal: Negative.   Skin: Negative.   Allergic/Immunologic: Negative.   Neurological: Negative.   Hematological: Negative.   Psychiatric/Behavioral: Negative.     Blood pressure 138/80, pulse 89, temperature 97.8 F (36.6 C), temperature source Oral, resp. rate 19, height 5\' 6"  (1.676 m), SpO2 96 %. Physical Exam  GENERAL: The patient is AO x3, in no acute distress. HEENT: Head is normocephalic and atraumatic. EOMI are intact. Mouth is well hydrated and without lesions. NECK: Supple. No masses LUNGS: Clear to auscultation. No presence of rhonchi/wheezing/rales. Adequate chest expansion HEART: RRR, normal s1 and s2. ABDOMEN: Soft, nontender, no guarding, no peritoneal signs, and nondistended. BS +. No masses. EXTREMITIES: Without any cyanosis, clubbing, rash, lesions or edema. NEUROLOGIC:  AOx3, no focal motor deficit. SKIN: no jaundice, no rashes  Assessment/Plan 74 year old female with past medical history of atrial fibrillation, COPD, hypertension, hyperlipidemia , who comes to the hospital to undergo screening colonoscopy. The patient is at average risk for colorectal cancer.  We will proceed with colonoscopy today.   Dolores Frame, MD 07/01/2020, 8:26 AM

## 2020-07-01 NOTE — Op Note (Signed)
[facility] Patient Name: Tracey Dudley Procedure Date: 07/01/2020 8:45 AM MRN: [id number] Date of Birth: [birth year] Attending MD: Katrinka Blazing ,  CSN: [id number] Age: 74 Admit Type: Outpatient Procedure:                Colonoscopy Indications:              Screening for colorectal malignant neoplasm Providers:                Katrinka Blazing, Angelica Ran, Dyann Ruddle Referring MD:              Medicines:                Monitored Anesthesia Care Complications:             Estimated Blood Loss:     Estimated blood loss: none. Procedure:                Pre-Anesthesia Assessment:                           - Prior to the procedure, a History and Physical                            was performed, and patient medications, allergies                            and sensitivities were reviewed. The patient's                            tolerance of previous anesthesia was reviewed.                           - The risks and benefits of the procedure and the                            sedation options and risks were discussed with the                            patient. All questions were answered and informed                            consent was obtained.                           - ASA Grade Assessment: III - A patient with severe                            systemic disease.                           After obtaining informed consent, the colonoscope                            was passed under direct vision. Throughout the                            procedure, the patient's blood pressure, pulse, and  oxygen saturations were monitored continuously. The                            [id number]-[id number] ([id number]) scope was introduced through                            the anus and advanced to the the cecum, identified                            by appendiceal orifice and ileocecal valve. The                            colonoscopy was performed without difficulty. The                             patient tolerated the procedure well. The quality                            of the bowel preparation was adequate. Scope In: 9:02:49 AM Scope Out: 9:24:51 AM Scope Withdrawal Time: 0 hours 16 minutes 19 seconds  Total Procedure Duration: 0 hours 22 minutes 2 seconds  Findings:      The perianal and digital rectal examinations were normal.      A small amount of stool was found in the ascending colon and in the       cecum, making visualization difficult. Lavage of the area was performed,       resulting in clearance with fair visualization.      Non-bleeding internal hemorrhoids were found during retroflexion. The       hemorrhoids were small. Impression:               - Stool in the ascending colon and in the cecum.                           - Non-bleeding internal hemorrhoids.                           - No specimens collected. Moderate Sedation:      Per Anesthesia Care Recommendation:           - Discharge patient to home (ambulatory).                           - Resume previous diet.                           - Repeat colonoscopy in 5 years for screening                            purposes due to prep quality. Procedure Code(s):        --- Professional ---                           G9211, GC, Colorectal cancer screening; colonoscopy  on individual not meeting criteria for high risk Diagnosis Code(s):        --- Professional ---                           Z12.11, Encounter for screening for malignant                            neoplasm of colon                           K64.8, Other hemorrhoids CPT copyright 2019 American Medical Association. All rights reserved. The codes documented in this report are preliminary and upon coder review may  be revised to meet current compliance requirements. Katrinka Blazing, MD Katrinka Blazing,  07/01/2020 9:31:30 AM This report has been signed electronically. Number of Addenda: 0

## 2020-07-01 NOTE — Discharge Instructions (Signed)
You are being discharged to home.  Resume your previous diet.  Your physician has recommended a repeat colonoscopy in five years for screening purposes due to prep quality.  Restart Xarelto today.   Colonoscopy, Adult, Care After This sheet gives you information about how to care for yourself after your procedure. Your health care provider may also give you more specific instructions. If you have problems or questions, contact your health care provider. What can I expect after the procedure? After the procedure, it is common to have:  A small amount of blood in your stool for 24 hours after the procedure.  Some gas.  Mild cramping or bloating of your abdomen. Follow these instructions at home: Eating and drinking   Drink enough fluid to keep your urine pale yellow.    Resume your normal diet. Activity  Rest as told by your health care provider.  Ask for help if you feel weak or unsteady.  Return to your normal activities as told by your health care provider. Ask your health care provider what activities are safe for you. Managing cramping and bloating   Try walking around when you have cramps or feel bloated.  General instructions  For the first 24 hours after the procedure: ? Do not drive or use machinery. ? Do not sign important documents. ? Do not drink alcohol. ? Do your regular daily activities at a slower pace than normal. ? Eat soft foods that are easy to digest.  Take over-the-counter and prescription medicines only as told by your health care provider.  Keep all follow-up visits as told by your health care provider. This is important. Contact a health care provider if:  You have blood in your stool 2-3 days after the procedure. Get help right away if you have:  More than a small spotting of blood in your stool.  Large blood clots in your stool.  Swelling of your abdomen.  Nausea or vomiting.  A fever.  Increasing pain in your abdomen that is  not relieved with medicine. Summary  After the procedure, it is common to have a small amount of blood in your stool. You may also have mild cramping and bloating of your abdomen.  For the first 24 hours after the procedure, do not drive or use machinery, sign important documents, or drink alcohol.  Get help right away if you have a lot of blood in your stool, nausea or vomiting, a fever, or increased pain in your abdomen. This information is not intended to replace advice given to you by your health care provider. Make sure you discuss any questions you have with your health care provider. Document Revised: 03/30/2019 Document Reviewed: 03/30/2019 Elsevier Patient Education  2020 [employer].

## 2020-07-01 NOTE — Transfer of Care (Signed)
Immediate Anesthesia Transfer of Care Note  Patient: Tracey Dudley  Procedure(s) Performed: COLONOSCOPY WITH PROPOFOL (N/A )  Patient Location: PACU  Anesthesia Type:General  Level of Consciousness: sedated and patient cooperative  Airway & Oxygen Therapy: Patient Spontanous Breathing and Patient connected to nasal cannula oxygen  Post-op Assessment: Report given to RN and Post -op Vital signs reviewed and stable  Post vital signs: Reviewed and stable  Last Vitals:  Vitals Value Taken Time  BP 110/72 07/01/20 0930  Temp    Pulse 80 07/01/20 0931  Resp 19 07/01/20 0931  SpO2 94 % 07/01/20 0931  Vitals shown include unvalidated device data.  Last Pain:  Vitals:   07/01/20 0856  TempSrc:   PainSc: 0-No pain      Patients Stated Pain Goal: 2 (07/01/20 0801)  Complications: No complications documented.

## 2020-07-01 NOTE — Anesthesia Procedure Notes (Signed)
Date/Time: 07/01/2020 8:54 AM Performed by: Franco Nones, CRNA Pre-anesthesia Checklist: Patient identified, Emergency Drugs available, Suction available, Timeout performed and Patient being monitored Patient Re-evaluated:Patient Re-evaluated prior to induction Oxygen Delivery Method: Nasal Cannula

## 2020-07-01 NOTE — Anesthesia Preprocedure Evaluation (Addendum)
Anesthesia Evaluation  Patient identified by MRN, date of birth, ID band Patient awake    Reviewed: Allergy & Precautions, NPO status , Patient's Chart, lab work & pertinent test results  History of Anesthesia Complications Negative for: history of anesthetic complications  Airway Mallampati: II  TM Distance: >3 FB Neck ROM: Full    Dental  (+) Caps, Dental Advisory Given   Pulmonary shortness of breath, with exertion and Long-Term Oxygen Therapy, pneumonia, resolved, COPD,  COPD inhaler and oxygen dependent, former smoker,    Pulmonary exam normal breath sounds clear to auscultation       Cardiovascular Exercise Tolerance: Poor hypertension, Pt. on medications + DOE  + dysrhythmias Atrial Fibrillation + pacemaker  Rhythm:Irregular Rate:Abnormal     Neuro/Psych Anxiety    GI/Hepatic negative GI ROS, Neg liver ROS,   Endo/Other  negative endocrine ROS  Renal/GU Renal InsufficiencyRenal disease     Musculoskeletal negative musculoskeletal ROS (+)   Abdominal   Peds  Hematology negative hematology ROS (+)   Anesthesia Other Findings   Reproductive/Obstetrics negative OB ROS                           Anesthesia Physical Anesthesia Plan  ASA: IV  Anesthesia Plan: General   Post-op Pain Management:    Induction: Intravenous  PONV Risk Score and Plan: TIVA  Airway Management Planned: Nasal Cannula, Natural Airway and Simple Face Mask  Additional Equipment:   Intra-op Plan:   Post-operative Plan:   Informed Consent: I have reviewed the patients History and Physical, chart, labs and discussed the procedure including the risks, benefits and alternatives for the proposed anesthesia with the patient or authorized representative who has indicated his/her understanding and acceptance.     Dental advisory given  Plan Discussed with: CRNA and Surgeon  Anesthesia Plan Comments:         Anesthesia Quick Evaluation

## 2020-07-04 ENCOUNTER — Encounter (INDEPENDENT_AMBULATORY_CARE_PROVIDER_SITE_OTHER): Payer: Self-pay | Admitting: *Deleted

## 2020-07-04 NOTE — Progress Notes (Signed)
Staff very caring and warm, also very professional

## 2020-07-06 ENCOUNTER — Encounter (HOSPITAL_COMMUNITY): Payer: Self-pay | Admitting: Gastroenterology

## 2020-07-13 ENCOUNTER — Other Ambulatory Visit (HOSPITAL_COMMUNITY): Payer: Medicare Other

## 2020-08-01 ENCOUNTER — Other Ambulatory Visit: Payer: Self-pay | Admitting: Internal Medicine

## 2020-08-01 ENCOUNTER — Other Ambulatory Visit: Payer: Self-pay | Admitting: Cardiology

## 2020-08-10 ENCOUNTER — Ambulatory Visit (INDEPENDENT_AMBULATORY_CARE_PROVIDER_SITE_OTHER): Payer: Medicare Other

## 2020-08-10 DIAGNOSIS — I4891 Unspecified atrial fibrillation: Secondary | ICD-10-CM | POA: Diagnosis not present

## 2020-08-10 LAB — CUP PACEART REMOTE DEVICE CHECK
Battery Remaining Longevity: 130 mo
Battery Voltage: 3.01 V
Brady Statistic AP VP Percent: 0 %
Brady Statistic AP VS Percent: 0 %
Brady Statistic AS VP Percent: 32.34 %
Brady Statistic AS VS Percent: 67.66 %
Brady Statistic RA Percent Paced: 0 %
Brady Statistic RV Percent Paced: 32.34 %
Date Time Interrogation Session: 20211123191324
Implantable Lead Implant Date: 20190418
Implantable Lead Implant Date: 20190418
Implantable Lead Location: 753859
Implantable Lead Location: 753860
Implantable Lead Model: 3830
Implantable Lead Model: 5076
Implantable Pulse Generator Implant Date: 20190418
Lead Channel Impedance Value: 323 Ohm
Lead Channel Impedance Value: 361 Ohm
Lead Channel Impedance Value: 475 Ohm
Lead Channel Impedance Value: 608 Ohm
Lead Channel Pacing Threshold Amplitude: 0.625 V
Lead Channel Pacing Threshold Amplitude: 1.75 V
Lead Channel Pacing Threshold Pulse Width: 0.4 ms
Lead Channel Pacing Threshold Pulse Width: 0.4 ms
Lead Channel Sensing Intrinsic Amplitude: 1.625 mV
Lead Channel Sensing Intrinsic Amplitude: 2.625 mV
Lead Channel Sensing Intrinsic Amplitude: 4.125 mV
Lead Channel Sensing Intrinsic Amplitude: 4.125 mV
Lead Channel Setting Pacing Amplitude: 2.5 V
Lead Channel Setting Pacing Pulse Width: 1 ms
Lead Channel Setting Sensing Sensitivity: 1.2 mV

## 2020-08-18 NOTE — Progress Notes (Signed)
Remote pacemaker transmission.   

## 2020-09-14 ENCOUNTER — Ambulatory Visit: Payer: Medicare Other | Admitting: Internal Medicine

## 2020-09-14 ENCOUNTER — Other Ambulatory Visit: Payer: Self-pay

## 2020-09-14 ENCOUNTER — Encounter: Payer: Self-pay | Admitting: Internal Medicine

## 2020-09-14 VITALS — BP 132/84 | HR 86 | Ht 66.0 in | Wt 196.6 lb

## 2020-09-14 DIAGNOSIS — I4819 Other persistent atrial fibrillation: Secondary | ICD-10-CM | POA: Diagnosis not present

## 2020-09-14 LAB — CUP PACEART INCLINIC DEVICE CHECK
Battery Remaining Longevity: 129 mo
Battery Voltage: 3.01 V
Brady Statistic AP VP Percent: 0 %
Brady Statistic AP VS Percent: 0 %
Brady Statistic AS VP Percent: 32.31 %
Brady Statistic AS VS Percent: 67.69 %
Brady Statistic RA Percent Paced: 0 %
Brady Statistic RV Percent Paced: 32.31 %
Date Time Interrogation Session: 20211229145159
Implantable Lead Implant Date: 20190418
Implantable Lead Implant Date: 20190418
Implantable Lead Location: 753859
Implantable Lead Location: 753860
Implantable Lead Model: 3830
Implantable Lead Model: 5076
Implantable Pulse Generator Implant Date: 20190418
Lead Channel Impedance Value: 380 Ohm
Lead Channel Impedance Value: 399 Ohm
Lead Channel Impedance Value: 513 Ohm
Lead Channel Impedance Value: 627 Ohm
Lead Channel Pacing Threshold Amplitude: 1.5 V
Lead Channel Pacing Threshold Pulse Width: 1 ms
Lead Channel Sensing Intrinsic Amplitude: 5.5 mV
Lead Channel Setting Pacing Amplitude: 2.5 V
Lead Channel Setting Pacing Pulse Width: 1 ms
Lead Channel Setting Sensing Sensitivity: 1.2 mV

## 2020-09-14 NOTE — Patient Instructions (Addendum)
Medication Instructions:  No changes *If you need a refill on your cardiac medications before your next appointment, please call your pharmacy*   Lab Work: NONE   If you have labs (blood work) drawn today and your tests are completely normal, you will receive your results only by: Marland Kitchen MyChart Message (if you have MyChart) OR . A paper copy in the mail If you have any lab test that is abnormal or we need to change your treatment, we will call you to review the results.   Testing/Procedures: No testing   Follow-Up: At [facility], you and your health needs are our priority.  As part of our continuing mission to provide you with exceptional heart care, we have created designated Provider Care Teams.  These Care Teams include your primary Cardiologist (physician) and Advanced Practice Providers (APPs -  Physician Assistants and Nurse Practitioners) who all work together to provide you with the care you need, when you need it.  We recommend signing up for the patient portal called "MyChart".  Sign up information is provided on this After Visit Summary.  MyChart is used to connect with patients for Virtual Visits (Telemedicine).  Patients are able to view lab/test results, encounter notes, upcoming appointments, etc.  Non-urgent messages can be sent to your provider as well.   To learn more about what you can do with MyChart, go to [email or web address].    Your next appointment:   1 year follow up  The format for your next appointment:     Provider:   Dr. Ladona Ridgel    Other Instructions Thank you for choosing [facility]!

## 2020-09-14 NOTE — Progress Notes (Signed)
HPI Mrs. Degraaf returns today for followup. She is a pleasant 74 yo woman with a h/o uncontrolled atrial fib, s/p PPM and AV node ablation. She has some residual conduction and paces about 1/3 of the time. She admits to being sedentary but she feels well. She has been less active during covid. She denies chest pain or sob.  Allergies  Allergen Reactions  . Metoprolol Shortness Of Breath and Other (See Comments)    Tiredness  . Erythromycin Nausea Only     Current Outpatient Medications  Medication Sig Dispense Refill  . acetaminophen (TYLENOL) 500 MG tablet Take 500 mg by mouth every 6 (six) hours as needed for moderate pain or headache.     . albuterol (VENTOLIN HFA) 108 (90 Base) MCG/ACT inhaler Inhale 1 puff into the lungs every 6 (six) hours as needed for wheezing or shortness of breath. 18 g 3  . ALPRAZolam (XANAX) 0.5 MG tablet Take 0.5 mg by mouth 3 (three) times daily as needed for anxiety.     Marland Kitchen atorvastatin (LIPITOR) 20 MG tablet Take 20 mg by mouth every evening.     . Azilsartan Medoxomil (EDARBI) 80 MG TABS TAKE 1 TABLET BY MOUTH EVERY DAY (Patient taking differently: Take 80 mg by mouth daily.) 90 tablet 3  . BYSTOLIC 20 MG TABS TAKE 1 TABLET BY MOUTH EVERY DAY (Patient taking differently: Take 20 mg by mouth daily.) 90 tablet 2  . diltiazem (CARDIZEM CD) 120 MG 24 hr capsule TAKE 1 CAPSULE BY MOUTH TWICE A DAY 180 capsule 3  . OXYGEN Inhale 2 L into the lungs continuous.    Marland Kitchen PARoxetine (PAXIL) 20 MG tablet Take 20 mg by mouth daily.     . rivaroxaban (XARELTO) 20 MG TABS tablet Take 1 tablet (20 mg total) by mouth daily with supper. 90 tablet 2  . spironolactone (ALDACTONE) 25 MG tablet TAKE 1/2 TABLET (12.5MG ) BY MOUTH EVERY DAY (Patient taking differently: Take 12.5 mg by mouth daily.) 135 tablet 2  . TRELEGY ELLIPTA 100-62.5-25 MCG/INH AEPB INHALE 1 PUFF BY MOUTH EVERY DAY (Patient taking differently: Inhale 1 puff into the lungs daily.) 60 each 3  . triamcinolone  cream (KENALOG) 0.1 % Apply 1 application topically 2 (two) times daily.     . halobetasol (ULTRAVATE) 0.05 % ointment APPLY TO AFFECTED AREA EVERY DAY (Patient not taking: Reported on 06/21/2020)  6   No current facility-administered medications for this visit.     Past Medical History:  Diagnosis Date  . Anxiety   . Atrial fibrillation (HCC)    Followed by Dr. Leonie Green in [place], also [facility] EP assessment by Dr. Christin Fudge  . COPD (chronic obstructive pulmonary disease) (HCC)   . Dyspnea    at times on exertion  . Essential hypertension   . Herpes zoster   . Hyperlipidemia   . 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 Vitamin D deficiency     ROS:   All systems reviewed and negative except as noted in the HPI.   Past Surgical History:  Procedure Laterality Date  . ABDOMINAL HYSTERECTOMY    . ABLATION OF DYSRHYTHMIC FOCUS  01/02/2018  . AV NODE ABLATION N/A 01/02/2018   Procedure: AV NODE ABLATION;  Surgeon: Marinus Maw, MD;  Location: [facility] INVASIVE CV LAB;  Service: Cardiovascular;  Laterality: N/A;  . CATARACT EXTRACTION    . COLONOSCOPY WITH PROPOFOL N/A 07/01/2020   Procedure: COLONOSCOPY WITH PROPOFOL;  Surgeon: Dolores Frame,  MD;  [facility]O [facility];  Service: Gastroenterology;  Laterality: N/A;  815  . INSERT / REPLACE / REMOVE PACEMAKER  01/02/2018   DUAL CHAMBER  . PACEMAKER IMPLANT N/A 01/02/2018   Procedure: PACEMAKER IMPLANT - Dual Chamber;  Surgeon: Marinus Maw, MD;  [facility]: [facility] INVASIVE CV LAB;  Service: Cardiovascular;  Laterality: N/A;     Family History  Problem Relation Age of Onset  . Breast cancer Sister   . CAD Father   . Asthma Mother      Social History   Socioeconomic History  . Marital status: Married    Spouse name: Not on file  . Number of children: Not on file  . Years of education: Not on file  . Highest education level: Not on file  Occupational History  . Not on file  Tobacco Use  . Smoking  status: Former Smoker    Packs/day: 0.50    Years: 30.00    Pack years: 15.00    Types: Cigarettes    Start date: 11/03/1977    Quit date: 06/01/2014    Years since quitting: 6.2  . Smokeless tobacco: Never Used  Vaping Use  . Vaping Use: Never used  Substance and Sexual Activity  . Alcohol use: No    Alcohol/week: 0.0 standard drinks  . Drug use: No  . Sexual activity: Yes  Other Topics Concern  . Not on file  Social History Narrative  . Not on file   Social Determinants of Health   Financial Resource Strain: Not on file  Food Insecurity: Not on file  Transportation Needs: Not on file  Physical Activity: Not on file  Stress: Not on file  Social Connections: Not on file  Intimate Partner Violence: Not on file     BP 132/84   Pulse 86   Ht 5\' 6"  (1.676 m)   Wt 196 lb 9.6 oz (89.2 kg)   SpO2 95%   BMI 31.73 kg/m   Physical Exam:  Well appearing NAD HEENT: Unremarkable Neck:  No JVD, no thyromegally Lymphatics:  No adenopathy Back:  No CVA tenderness Lungs:  Clear with no wheezes HEART:  Regular rate rhythm, no murmurs, no rubs, no clicks Abd:  soft, positive bowel sounds, no organomegally, no rebound, no guarding Ext:  2 plus pulses, no edema, no cyanosis, no clubbing Skin:  No rashes no nodules Neuro:  CN II through XII intact, motor grossly intact  DEVICE  Normal device function.  See PaceArt for details.   Assess/Plan: 1. Atrial fib - her VR is fairly well controlled. She will continue her current meds. 2. HTN - her bp is controlled. Continue current meds. 3. Obesity - I encouraged the patient to lose weight.  4. COPD - she is wearing oxygen. She has been vaccinated from Covid.  Jenia Klepper,MD

## 2020-09-19 ENCOUNTER — Telehealth: Payer: Self-pay | Admitting: Pulmonary Disease

## 2020-09-19 NOTE — Telephone Encounter (Signed)
Spoke with the pt  She states she has been having a lot of PND the past few wks  She has had some yellow nasal d/c over the past 4 days  She states NOT having any cough, SOB, wheezing, chest tightness, f/c/s, sore throat  She states feels the need to clear her throat frequently  Still taking her trelegy and using ventolin prn She has been vaccinated against covid  She states that when she seen Dr Vassie Loll last he told her to call if needed and he would send a zpack  Please advise thanks!

## 2020-09-19 NOTE — Telephone Encounter (Signed)
Tried calling pt and there was no answer- LMTCB.  

## 2020-09-19 NOTE — Telephone Encounter (Signed)
No fever, does not feel sick.  Covid vaccines are up-to-date. No loss of taste or smell.  No known exposures.  Recommend saline nasal rinses as needed, Mucinex, Claritin 5 mg as needed.  Please contact office for sooner follow up if symptoms do not improve or worsen or seek emergency care   To [facility]

## 2020-09-23 ENCOUNTER — Other Ambulatory Visit: Payer: Self-pay | Admitting: Pulmonary Disease

## 2020-10-21 ENCOUNTER — Other Ambulatory Visit: Payer: Self-pay

## 2020-10-21 ENCOUNTER — Encounter: Payer: Self-pay | Admitting: Pulmonary Disease

## 2020-10-21 ENCOUNTER — Ambulatory Visit: Payer: Medicare Other | Admitting: Pulmonary Disease

## 2020-10-21 DIAGNOSIS — J432 Centrilobular emphysema: Secondary | ICD-10-CM | POA: Diagnosis not present

## 2020-10-21 DIAGNOSIS — J9611 Chronic respiratory failure with hypoxia: Secondary | ICD-10-CM

## 2020-10-21 MED ORDER — ALBUTEROL SULFATE HFA 108 (90 BASE) MCG/ACT IN AERS: 1.0000 | INHALATION_SPRAY | Freq: Four times a day (QID) | RESPIRATORY_TRACT | 3 refills | Status: AC | PRN

## 2020-10-21 MED ORDER — TRELEGY ELLIPTA 100-62.5-25 MCG/INH IN AEPB: 1.0000 | INHALATION_SPRAY | Freq: Every day | RESPIRATORY_TRACT | 5 refills | Status: DC

## 2020-10-21 NOTE — Assessment & Plan Note (Signed)
I have asked her to trial staying off oxygen for short periods while at rest. She certainly needs this during ambulation and sleep . I encouraged her to get Covid booster shot. I also encouraged her to keep exercising at home

## 2020-10-21 NOTE — Progress Notes (Signed)
   Subjective:    Patient ID: Tracey Dudley, female    DOB: [birth year], 75 y.o.   MRN: [id number]  HPI  75 year old for FU of severe COPD and chronic hypoxic respiratory failure  Quit smoking 2015 PMH - history of incomplete AV node ablation with placement of Medtronic pacemaker and His bundle lead.On cardizem CD, bystolic & xarelto  Chief Complaint  Patient presents with  . Follow-up    Nasal drainage, now clear drainage since taking mucinex   She called on 1/3 for nasal drainage, purulent. Given Mucinex and this seemed to clear up after a few days.  Did not develop into cough or wheezing. She completed pulmonary rehab program and was on maintenance and now uses an elliptical at home. She has severe anxiety she is compliant with oxygen and uses this 24 hours able to go for short durations while at rest without it. Saturation was 93% at rest on ambulation dropped to 88%  Significant tests/ events reviewed  PFTs 01/2017 >> severe airway obstruction with ratio 37, FEV1 0.59/23%, FVC 1.61/49%, no bronchodilator response, TLC 134% and DLCO 36%  Echo 09/2015 normal LV function, grade 2 diastolic dysfunction  Review of Systems neg for any significant sore throat, dysphagia, itching, sneezing, nasal congestion or excess/ purulent secretions, fever, chills, sweats, unintended wt loss, pleuritic or exertional cp, hempoptysis, orthopnea pnd or change in chronic leg swelling. Also denies presyncope, palpitations, heartburn, abdominal pain, nausea, vomiting, diarrhea or change in bowel or urinary habits, dysuria,hematuria, rash, arthralgias, visual complaints, headache, numbness weakness or ataxia.     Objective:   Physical Exam  Gen. Pleasant, obese, in no distress ENT - no lesions, no post nasal drip Neck: No JVD, no thyromegaly, no carotid bruits Lungs: no use of accessory muscles, no dullness to percussion, decreased without rales or rhonchi  Cardiovascular: Rhythm regular, heart  sounds  normal, no murmurs or gallops, no peripheral edema Musculoskeletal: No deformities, no cyanosis or clubbing , no tremors       Assessment & Plan:

## 2020-10-21 NOTE — Assessment & Plan Note (Signed)
Continue Trelegy this is worked well for her and she is tolerating this well without side effects

## 2020-10-21 NOTE — Patient Instructions (Addendum)
OK for booster Refills on trelegy & albuterol  Ambulatory sat

## 2020-11-02 ENCOUNTER — Other Ambulatory Visit: Payer: Self-pay | Admitting: Cardiology

## 2020-11-09 ENCOUNTER — Ambulatory Visit (INDEPENDENT_AMBULATORY_CARE_PROVIDER_SITE_OTHER): Payer: Medicare Other

## 2020-11-09 DIAGNOSIS — I4819 Other persistent atrial fibrillation: Secondary | ICD-10-CM

## 2020-11-10 LAB — CUP PACEART REMOTE DEVICE CHECK
Battery Remaining Longevity: 127 mo
Battery Voltage: 3 V
Brady Statistic AP VP Percent: 0 %
Brady Statistic AP VS Percent: 0 %
Brady Statistic AS VP Percent: 33.25 %
Brady Statistic AS VS Percent: 66.75 %
Brady Statistic RA Percent Paced: 0 %
Brady Statistic RV Percent Paced: 33.25 %
Date Time Interrogation Session: 20220222193933
Implantable Lead Implant Date: 20190418
Implantable Lead Implant Date: 20190418
Implantable Lead Location: 753859
Implantable Lead Location: 753860
Implantable Lead Model: 3830
Implantable Lead Model: 5076
Implantable Pulse Generator Implant Date: 20190418
Lead Channel Impedance Value: 380 Ohm
Lead Channel Impedance Value: 399 Ohm
Lead Channel Impedance Value: 513 Ohm
Lead Channel Impedance Value: 608 Ohm
Lead Channel Pacing Threshold Amplitude: 0.625 V
Lead Channel Pacing Threshold Amplitude: 1.75 V
Lead Channel Pacing Threshold Pulse Width: 0.4 ms
Lead Channel Pacing Threshold Pulse Width: 0.4 ms
Lead Channel Sensing Intrinsic Amplitude: 1.625 mV
Lead Channel Sensing Intrinsic Amplitude: 2.625 mV
Lead Channel Sensing Intrinsic Amplitude: 4 mV
Lead Channel Sensing Intrinsic Amplitude: 4 mV
Lead Channel Setting Pacing Amplitude: 2.5 V
Lead Channel Setting Pacing Pulse Width: 1 ms
Lead Channel Setting Sensing Sensitivity: 1.2 mV

## 2020-11-18 NOTE — Progress Notes (Signed)
Remote pacemaker transmission.   

## 2020-11-21 ENCOUNTER — Telehealth: Payer: Self-pay | Admitting: Pulmonary Disease

## 2020-11-21 NOTE — Telephone Encounter (Signed)
Called and spoke with patient who states that she needs form filled out to excuse her from [employer]. She states that she has been trying to fax it but it is not going through. I verified fax number for [place] office and [place] office. Patient states that she thinks its her machine. She stated she is going to try again in the morning and then mail the papers to the [place] office for Dr. Vassie Loll to fill out. Will route to Dr. Vassie Loll as well as Archie Patten to keep an eye out for paperwork from patient. Patient states that paperwork has to be mailed back to her so that she can mail it in they wont accept it from Dr's office. Nothing further needed at this time.

## 2020-11-28 ENCOUNTER — Telehealth: Payer: Self-pay

## 2020-11-28 NOTE — Telephone Encounter (Signed)
Dr Vassie Loll filled out and signed jury duty paperwork/forms and paperwork sent by mail to patient. Patient called and made aware. Nothing further needed at this time.

## 2021-02-08 ENCOUNTER — Ambulatory Visit (INDEPENDENT_AMBULATORY_CARE_PROVIDER_SITE_OTHER): Payer: Medicare Other

## 2021-02-08 DIAGNOSIS — I4819 Other persistent atrial fibrillation: Secondary | ICD-10-CM

## 2021-02-09 LAB — CUP PACEART REMOTE DEVICE CHECK
Battery Remaining Longevity: 123 mo
Battery Voltage: 3 V
Brady Statistic AP VP Percent: 0 %
Brady Statistic AP VS Percent: 0 %
Brady Statistic AS VP Percent: 37.25 %
Brady Statistic AS VS Percent: 62.75 %
Brady Statistic RA Percent Paced: 0 %
Brady Statistic RV Percent Paced: 37.25 %
Date Time Interrogation Session: 20220524185631
Implantable Lead Implant Date: 20190418
Implantable Lead Implant Date: 20190418
Implantable Lead Location: 753859
Implantable Lead Location: 753860
Implantable Lead Model: 3830
Implantable Lead Model: 5076
Implantable Pulse Generator Implant Date: 20190418
Lead Channel Impedance Value: 380 Ohm
Lead Channel Impedance Value: 380 Ohm
Lead Channel Impedance Value: 513 Ohm
Lead Channel Impedance Value: 589 Ohm
Lead Channel Pacing Threshold Amplitude: 0.625 V
Lead Channel Pacing Threshold Amplitude: 1.75 V
Lead Channel Pacing Threshold Pulse Width: 0.4 ms
Lead Channel Pacing Threshold Pulse Width: 0.4 ms
Lead Channel Sensing Intrinsic Amplitude: 1.625 mV
Lead Channel Sensing Intrinsic Amplitude: 2.625 mV
Lead Channel Sensing Intrinsic Amplitude: 3.875 mV
Lead Channel Sensing Intrinsic Amplitude: 3.875 mV
Lead Channel Setting Pacing Amplitude: 2.5 V
Lead Channel Setting Pacing Pulse Width: 1 ms
Lead Channel Setting Sensing Sensitivity: 1.2 mV

## 2021-02-14 ENCOUNTER — Other Ambulatory Visit: Payer: Self-pay | Admitting: Cardiology

## 2021-03-06 NOTE — Progress Notes (Signed)
Remote pacemaker transmission.   

## 2021-05-10 ENCOUNTER — Ambulatory Visit (INDEPENDENT_AMBULATORY_CARE_PROVIDER_SITE_OTHER): Payer: Medicare Other

## 2021-05-10 DIAGNOSIS — I4819 Other persistent atrial fibrillation: Secondary | ICD-10-CM | POA: Diagnosis not present

## 2021-05-10 LAB — CUP PACEART REMOTE DEVICE CHECK
Battery Remaining Longevity: 119 mo
Battery Voltage: 3 V
Brady Statistic AP VP Percent: 0 %
Brady Statistic AP VS Percent: 0 %
Brady Statistic AS VP Percent: 35.62 %
Brady Statistic AS VS Percent: 64.38 %
Brady Statistic RA Percent Paced: 0 %
Brady Statistic RV Percent Paced: 35.62 %
Date Time Interrogation Session: 20220824113429
Implantable Lead Implant Date: 20190418
Implantable Lead Implant Date: 20190418
Implantable Lead Location: 753859
Implantable Lead Location: 753860
Implantable Lead Model: 3830
Implantable Lead Model: 5076
Implantable Pulse Generator Implant Date: 20190418
Lead Channel Impedance Value: 380 Ohm
Lead Channel Impedance Value: 380 Ohm
Lead Channel Impedance Value: 513 Ohm
Lead Channel Impedance Value: 570 Ohm
Lead Channel Pacing Threshold Amplitude: 0.625 V
Lead Channel Pacing Threshold Amplitude: 1.75 V
Lead Channel Pacing Threshold Pulse Width: 0.4 ms
Lead Channel Pacing Threshold Pulse Width: 0.4 ms
Lead Channel Sensing Intrinsic Amplitude: 1.625 mV
Lead Channel Sensing Intrinsic Amplitude: 2.625 mV
Lead Channel Sensing Intrinsic Amplitude: 3.25 mV
Lead Channel Sensing Intrinsic Amplitude: 3.25 mV
Lead Channel Setting Pacing Amplitude: 2.5 V
Lead Channel Setting Pacing Pulse Width: 1 ms
Lead Channel Setting Sensing Sensitivity: 1.2 mV

## 2021-05-17 ENCOUNTER — Other Ambulatory Visit: Payer: Self-pay

## 2021-05-17 ENCOUNTER — Other Ambulatory Visit: Payer: Self-pay | Admitting: Cardiology

## 2021-05-17 ENCOUNTER — Ambulatory Visit: Payer: Medicare Other | Admitting: Cardiology

## 2021-05-17 ENCOUNTER — Encounter: Payer: Self-pay | Admitting: Cardiology

## 2021-05-17 ENCOUNTER — Other Ambulatory Visit (HOSPITAL_COMMUNITY)
Admission: RE | Admit: 2021-05-17 | Discharge: 2021-05-17 | Disposition: A | Discharge status: Home / Self Care | Payer: Medicare Other | Source: Ambulatory Visit | Attending: Cardiology | Admitting: Cardiology

## 2021-05-17 VITALS — BP 132/74 | HR 65 | Ht 66.0 in | Wt 197.2 lb

## 2021-05-17 DIAGNOSIS — Z79899 Other long term (current) drug therapy: Secondary | ICD-10-CM

## 2021-05-17 DIAGNOSIS — Z950 Presence of cardiac pacemaker: Secondary | ICD-10-CM

## 2021-05-17 DIAGNOSIS — I4821 Permanent atrial fibrillation: Secondary | ICD-10-CM | POA: Diagnosis not present

## 2021-05-17 DIAGNOSIS — I4819 Other persistent atrial fibrillation: Secondary | ICD-10-CM

## 2021-05-17 LAB — CBC
HCT: 39.5 % (ref 36.0–46.0)
Hemoglobin: 12.6 g/dL (ref 12.0–15.0)
MCH: 33.8 pg (ref 26.0–34.0)
MCHC: 31.9 g/dL (ref 30.0–36.0)
MCV: 105.9 fL — ABNORMAL HIGH (ref 80.0–100.0)
Platelets: 181 10*3/uL (ref 150–400)
RBC: 3.73 MIL/uL — ABNORMAL LOW (ref 3.87–5.11)
RDW: 12.7 % (ref 11.5–15.5)
WBC: 8.9 10*3/uL (ref 4.0–10.5)
nRBC: 0 % (ref 0.0–0.2)

## 2021-05-17 LAB — BASIC METABOLIC PANEL
Anion gap: 5 (ref 5–15)
BUN: 32 mg/dL — ABNORMAL HIGH (ref 8–23)
CO2: 32 mmol/L (ref 22–32)
Calcium: 8.8 mg/dL — ABNORMAL LOW (ref 8.9–10.3)
Chloride: 97 mmol/L — ABNORMAL LOW (ref 98–111)
Creatinine, Ser: 1.65 mg/dL — ABNORMAL HIGH (ref 0.44–1.00)
GFR, Estimated: 33 mL/min — ABNORMAL LOW (ref >60)
Glucose, Bld: 96 mg/dL (ref 70–99)
Potassium: 5.8 mmol/L — ABNORMAL HIGH (ref 3.5–5.1)
Sodium: 134 mmol/L — ABNORMAL LOW (ref 135–145)

## 2021-05-17 NOTE — Telephone Encounter (Signed)
Prescription refill request for Xarelto received.  Indication: PAF Last office visit: 09/14/20  Rosette Reveal MD Weight: 89.2kg Age:  75 Scr: 1.3 on 01/01/20 CrCl: 54.27  Based on above findings Xarelto 20mg  daily is the appropriate dose.  Pt is past due for OV and labs.  She has an appt with Dr today.  CBC/BMP ordered.  Refill approved x 1 only until lab results back.

## 2021-05-17 NOTE — Progress Notes (Signed)
Cardiology Office Note  Date: 05/17/2021   ID: Tracey Dudley, DOB 13-Jan-1946, MRN [id number]  PCP:  Jonathon Bellows, DO  Cardiologist:  Nona Dell, MD Electrophysiologist:  Lewayne Bunting, MD   Chief Complaint  Patient presents with   Cardiac follow-up     History of Present Illness: Tracey Dudley is a 75 y.o. female last seen in March 2021.  She had interval follow-up with Dr. Ladona Ridgel in December 2021, I reviewed the note.  She is here for a routine visit.  Reports no palpitations with stable dyspnea on exertion, remains on supplemental oxygen.  She has a exercise bicycle that she uses at home on a regular basis.  She has a Medtronic pacemaker in place with His bundle lead and history of incomplete AV node ablation.  Device check indicated normal function.  I personally reviewed her ECG today which shows rate controlled atrial fibrillation with intermittent ventricular paced beats.  We went over her medications.  She does not describe any spontaneous bleeding problems on Xarelto.  Also continues on Cardizem CD 120 mg twice daily.  She has followed lab work with her PCP, requesting interval results.  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   Medtronic with history of incomplete AV node ablation   Vitamin D deficiency     Past Surgical History:  Procedure Laterality Date   ABDOMINAL HYSTERECTOMY     ABLATION OF DYSRHYTHMIC FOCUS  01/02/2018   AV NODE ABLATION N/A 01/02/2018   Procedure: AV NODE ABLATION;  Surgeon: Marinus Maw, MD;  [facility] INVASIVE CV LAB;  Service: Cardiovascular;  Laterality: N/A;   CATARACT EXTRACTION     COLONOSCOPY WITH PROPOFOL N/A 07/01/2020   Procedure: COLONOSCOPY WITH PROPOFOL;  Surgeon: Dolores Frame, MD;  Location: [facility]  ENDO SUITE;  Service: Gastroenterology;  Laterality: N/A;  815   INSERT / REPLACE / REMOVE PACEMAKER  01/02/2018   DUAL CHAMBER   PACEMAKER IMPLANT N/A 01/02/2018   Procedure: PACEMAKER IMPLANT - Dual Chamber;  Surgeon: Marinus Maw, MD;  [facility]: [facility] INVASIVE CV LAB;  Service: Cardiovascular;  Laterality: N/A;    Current Outpatient Medications  Medication Sig Dispense Refill   acetaminophen (TYLENOL) 500 MG tablet Take 500 mg by mouth every 6 (six) hours as needed for moderate pain or headache.      albuterol (VENTOLIN HFA) 108 (90 Base) MCG/ACT inhaler Inhale 1 puff into the lungs every 6 (six) hours as needed for wheezing or shortness of breath. 18 g 3   ALPRAZolam (XANAX) 0.5 MG tablet Take 0.5 mg by mouth 3 (three) times daily as needed for anxiety.      atorvastatin (LIPITOR) 20 MG tablet Take 20 mg by mouth every evening.      diltiazem (CARDIZEM CD) 120 MG 24 hr capsule TAKE 1 CAPSULE BY MOUTH TWICE A DAY 180 capsule 3   EDARBI 80 MG TABS TAKE 1 TABLET BY MOUTH EVERY DAY 60 tablet 0   halobetasol (ULTRAVATE) 0.05 % ointment APPLY TO AFFECTED AREA EVERY DAY  6   Nebivolol HCl 20 MG TABS TAKE 1 TABLET BY MOUTH EVERY DAY 90 tablet 2   OXYGEN Inhale 2 L into the lungs continuous.     PARoxetine (PAXIL) 20 MG  tablet Take 20 mg by mouth daily.      rivaroxaban (XARELTO) 20 MG TABS tablet TAKE 1 TABLET BY MOUTH DAILY WITH SUPPER. 30 tablet 0   spironolactone (ALDACTONE) 25 MG tablet TAKE 1/2 TABLET (12.5MG ) BY MOUTH EVERY DAY (Patient taking differently: Take 12.5 mg by mouth daily.) 135 tablet 2   TRELEGY ELLIPTA 100-62.5-25 MCG/INH AEPB Inhale 1 puff into the lungs daily. 1 each 5   triamcinolone cream (KENALOG) 0.1 % Apply 1 application topically 2 (two) times daily.      No current facility-administered medications for this visit.   Allergies:  Metoprolol and Erythromycin   ROS: No orthopnea or PND.  No syncope.  Physical Exam: VS:  BP 132/74   Pulse 65   Ht 5\' 6"  (1.676 m)    Wt 197 lb 3.2 oz (89.4 kg)   SpO2 95%   BMI 31.83 kg/m , BMI Body mass index is 31.83 kg/m.  Wt Readings from Last 3 Encounters:  05/17/21 197 lb 3.2 oz (89.4 kg)  10/21/20 194 lb 6.4 oz (88.2 kg)  09/14/20 196 lb 9.6 oz (89.2 kg)    General: Patient appears comfortable at rest.  Wearing oxygen via nasal cannula. HEENT: Conjunctiva and lids normal, wearing a mask. Neck: Supple, no elevated JVP or carotid bruits, no thyromegaly. Lungs: Clear to auscultation, nonlabored breathing at rest. Cardiac: Irregularly irregular, no S3, 1/6 systolic murmur. Extremities: No pitting edema.  ECG:  An ECG dated 12/08/2019 was personally reviewed today and demonstrated:  Atrial fibrillation with right bundle much block and single ventricular paced beat.  Recent Labwork:  No interval lab work for review today.  Other Studies Reviewed Today:  Echocardiogram 10/10/2015: Study Conclusions   - Left ventricle: The cavity size was normal. Wall thickness was   increased in a pattern of mild LVH. Systolic function was   vigorous. The estimated ejection fraction was in the range of 75%   to 80%. Wall motion was normal; there were no regional wall   motion abnormalities. Features are consistent with a pseudonormal   left ventricular filling pattern, with concomitant abnormal   relaxation and increased filling pressure (grade 2 diastolic   dysfunction). - Aortic valve: Poorly visualized. Mildly calcified annulus.   Probably trileaflet. Mean gradient (S): 13 mm Hg. Peak gradient   (S): 24 mm Hg. Gradients likely increased due to relatively small   LVOT and vigorous contraction. Limited views of valve leaflets   show grossly preserved excursion. - Mitral valve: Mildly thickened leaflets . There was mild   regurgitation. - Left atrium: The atrium was at the upper limits of normal in   size. - Right atrium: Central venous pressure (est): 3 mm Hg. - Tricuspid valve: There was trivial regurgitation. -  Pulmonary arteries: Systolic pressure could not be accurately   estimated. - Pericardium, extracardiac: A prominent pericardial fat pad was   present.   Impressions:   - Mild LVH with LVEF 75-80% and grade 2 diastolic dysfunction with   increased filling pressures. Normal left atrial chamber size.   Mildly thickened mitral leaflets with mild mitral regurgitation.   Aortic valve appears mildly sclerotic without definitive   stenosis. Trivial tricuspid regurgitation, PASP not estimated.  Assessment and Plan:  1.  Permanent atrial fibrillation with CHA2DS2-VASc score of 3.  She is status post incomplete AV node ablation and has a Medtronic pacemaker in place with follow-up by Dr. 10/12/2015.  Symptomatically stable and heart rate well controlled.  No change to  current dose of Cardizem CD.  Continue Xarelto for stroke prophylaxis.  Requesting interval lab work from PCP.  2.  COPD with chronic hypoxic respiratory failure.  Continues to follow with Dr. Vassie Loll and remains on supplemental oxygen.  Medication Adjustments/Labs and Tests Ordered: Current medicines are reviewed at length with the patient today.  Concerns regarding medicines are outlined above.   Tests Ordered: Orders Placed This Encounter  Procedures   EKG 12-Lead    Medication Changes: No orders of the defined types were placed in this encounter.   Disposition:  Follow up  6 months.  Signed, Jonelle Sidle, MD, [facility] 05/17/2021 2:34 PM    [facility] at [facility] [address], [place], [place] [address] Phone: [phone]; Fax: [phone]

## 2021-05-17 NOTE — Patient Instructions (Signed)

## 2021-05-17 NOTE — Progress Notes (Signed)
CBC/BMET per Vashti Hey for Xarelto refills.

## 2021-05-18 ENCOUNTER — Telehealth: Payer: Self-pay

## 2021-05-18 MED ORDER — RIVAROXABAN 15 MG PO TABS: 15.0000 mg | ORAL_TABLET | Freq: Every day | ORAL | 6 refills | Status: DC

## 2021-05-18 NOTE — Telephone Encounter (Signed)
Message Received: Tracey Dudley, Tracey Bolus, MD  Nori Riis, RN Results reviewed.  Creatinine is 1.65 and her potassium was elevated at 5.8.  I would suggest stopping Aldactone, please forward results to her PCP to see if this is in line with what they have been tracking.  As far as her Xarelto dose, would cut back to 15 mg daily based on creatinine clearance of 43.

## 2021-05-18 NOTE — Telephone Encounter (Signed)
I spoke with patient. She agrees to stop Aldactone and reduce Xarelto to 15 mg daily.I e-scribed to [facility]. I will forward labs to pcp.

## 2021-05-18 NOTE — Telephone Encounter (Signed)
-----   Message from Jonelle Sidle, MD sent at 05/17/2021  3:56 PM EDT ----- Results reviewed.  Hemoglobin is normal at 12.6.

## 2021-05-25 NOTE — Progress Notes (Signed)
Remote pacemaker transmission.   

## 2021-05-31 ENCOUNTER — Other Ambulatory Visit: Payer: Self-pay | Admitting: Cardiology

## 2021-06-05 ENCOUNTER — Telehealth: Payer: Self-pay | Admitting: Pulmonary Disease

## 2021-06-05 NOTE — Telephone Encounter (Signed)
Please advise on the CMN for the pts oxygen.  Thanks

## 2021-06-06 NOTE — Telephone Encounter (Signed)
I have not received anything on this patient It may have went to [employer]

## 2021-06-06 NOTE — Telephone Encounter (Signed)
Left Message to have CMN to be signed faxed to [phone].

## 2021-06-13 ENCOUNTER — Other Ambulatory Visit: Payer: Self-pay | Admitting: Internal Medicine

## 2021-06-13 NOTE — Telephone Encounter (Signed)
This is a [facility] pt.  °

## 2021-06-24 ENCOUNTER — Other Ambulatory Visit: Payer: Self-pay | Admitting: Cardiology

## 2021-07-05 ENCOUNTER — Ambulatory Visit: Payer: Medicare Other | Admitting: Pulmonary Disease

## 2021-07-05 ENCOUNTER — Other Ambulatory Visit: Payer: Self-pay

## 2021-07-05 ENCOUNTER — Encounter: Payer: Self-pay | Admitting: Pulmonary Disease

## 2021-07-05 DIAGNOSIS — J432 Centrilobular emphysema: Secondary | ICD-10-CM

## 2021-07-05 DIAGNOSIS — J9611 Chronic respiratory failure with hypoxia: Secondary | ICD-10-CM

## 2021-07-05 NOTE — Assessment & Plan Note (Addendum)
Refills will be provided on Trelegy. Albuterol MDI to use on a as needed basis Advised her to use OTC Zyrtec or Claritin for nasal drainage

## 2021-07-05 NOTE — Progress Notes (Signed)
   Subjective:    Patient ID: Tracey Dudley, female    DOB: [birth year], 75 y.o.   MRN: [id number]  HPI  75 yo for FU of severe COPD and chronic hypoxic respiratory failure   Quit smoking 2015 -completed PR PMH - history of incomplete AV node ablation  s/p PPM On cardizem CD, nebivolol & xarelto -severe anxiety  Chief Complaint  Patient presents with   Follow-up    Using 2lpm. Pt states her SOB is about the same as last OV    65-month follow-up visit. She is compliant with oxygen.  Dyspnea is at baseline.  She tries to use an exercise machine at home. No increased cough or wheezing Left lower extremity swells up a bit, this is chronic.  She has increased nasal drainage, using the Mucinex every 2 to 3 days and this seems to help    Significant tests/ events reviewed  PFTs 01/2017 >> severe airway obstruction with ratio 37, FEV1 0.59/23%, FVC 1.61/49%, no bronchodilator response, TLC 134% and DLCO 36%   Echo 09/2015 normal LV function, grade 2 diastolic dysfunction  Review of Systems neg for any significant sore throat, dysphagia, itching, sneezing, nasal congestion or excess/ purulent secretions, fever, chills, sweats, unintended wt loss, pleuritic or exertional cp, hempoptysis, orthopnea pnd or change in chronic leg swelling. Also denies presyncope, palpitations, heartburn, abdominal pain, nausea, vomiting, diarrhea or change in bowel or urinary habits, dysuria,hematuria, rash, arthralgias, visual complaints, headache, numbness weakness or ataxia.     Objective:   Physical Exam  Gen. Pleasant, elderly well-nourished, in no distress, on oxygen nasal cannula ENT - no thrush, no pallor/icterus,no post nasal drip Neck: No JVD, no thyromegaly, no carotid bruits Lungs: no use of accessory muscles, no dullness to percussion, decreased bilateral without rales or rhonchi  Cardiovascular: Rhythm regular, heart sounds  normal, no murmurs or gallops, no peripheral edema Musculoskeletal: No  deformities, no cyanosis or clubbing        Assessment & Plan:

## 2021-07-05 NOTE — Assessment & Plan Note (Signed)
Continue 2 L oxygen at all times 

## 2021-07-05 NOTE — Patient Instructions (Addendum)
  Refills on Trelegy Zyrtec or claritin OTC for nasal drainage

## 2021-08-09 ENCOUNTER — Ambulatory Visit (INDEPENDENT_AMBULATORY_CARE_PROVIDER_SITE_OTHER): Payer: Medicare Other

## 2021-08-09 DIAGNOSIS — I4819 Other persistent atrial fibrillation: Secondary | ICD-10-CM

## 2021-08-09 LAB — CUP PACEART REMOTE DEVICE CHECK
Battery Remaining Longevity: 114 mo
Battery Voltage: 3 V
Brady Statistic AP VP Percent: 0 %
Brady Statistic AP VS Percent: 0 %
Brady Statistic AS VP Percent: 26.61 %
Brady Statistic AS VS Percent: 73.39 %
Brady Statistic RA Percent Paced: 0 %
Brady Statistic RV Percent Paced: 26.61 %
Date Time Interrogation Session: 20221122205540
Implantable Lead Implant Date: 20190418
Implantable Lead Implant Date: 20190418
Implantable Lead Location: 753859
Implantable Lead Location: 753860
Implantable Lead Model: 3830
Implantable Lead Model: 5076
Implantable Pulse Generator Implant Date: 20190418
Lead Channel Impedance Value: 342 Ohm
Lead Channel Impedance Value: 380 Ohm
Lead Channel Impedance Value: 513 Ohm
Lead Channel Impedance Value: 513 Ohm
Lead Channel Pacing Threshold Amplitude: 0.625 V
Lead Channel Pacing Threshold Amplitude: 1.75 V
Lead Channel Pacing Threshold Pulse Width: 0.4 ms
Lead Channel Pacing Threshold Pulse Width: 0.4 ms
Lead Channel Sensing Intrinsic Amplitude: 1.625 mV
Lead Channel Sensing Intrinsic Amplitude: 2.625 mV
Lead Channel Sensing Intrinsic Amplitude: 3.375 mV
Lead Channel Sensing Intrinsic Amplitude: 3.375 mV
Lead Channel Setting Pacing Amplitude: 2.5 V
Lead Channel Setting Pacing Pulse Width: 1 ms
Lead Channel Setting Sensing Sensitivity: 1.2 mV

## 2021-08-21 NOTE — Progress Notes (Signed)
Remote pacemaker transmission.   

## 2021-10-12 ENCOUNTER — Telehealth: Payer: Self-pay | Admitting: Cardiology

## 2021-10-12 NOTE — Telephone Encounter (Signed)
Pt c/o BP issue: STAT if pt c/o blurred vision, one-sided weakness or slurred speech  1. What are your last 5 BP readings?  01/19: 153/97 01/20: 134/82   No more dates available    2. Are you having any other symptoms (ex. Dizziness, headache, blurred vision, passed out)?  Leg weakness  3. What is your BP issue?   Patient states her BP has been elevated. Would like to know if her BP medication needs to be adjusted

## 2021-10-12 NOTE — Telephone Encounter (Signed)
Patient will keep a log of daily blood pressures for the next 5 days and call back on Mondays with readings. She states she is not eating or drinking as much but she will try to increase her fluids.She may go to Urgent care and get covid and influenza tested.

## 2021-10-16 NOTE — Telephone Encounter (Signed)
Pt c/o BP issue: STAT if pt c/o blurred vision, one-sided weakness or slurred speech  1. What are your last 5 BP readings?  01/28: 132/62  01/29: 133/61  01/30: 122/73    2. Are you having any other symptoms (ex. Dizziness, headache, blurred vision, passed out)?  Leg weakness  3. What is your BP issue?   Patient is following up to report additional BP readings. She also assumes she is becoming dehydrated.

## 2021-10-27 ENCOUNTER — Other Ambulatory Visit: Payer: Self-pay

## 2021-10-27 ENCOUNTER — Telehealth: Payer: Self-pay | Admitting: Pulmonary Disease

## 2021-10-27 MED ORDER — DOXYCYCLINE HYCLATE 100 MG PO TABS: 100.0000 mg | ORAL_TABLET | Freq: Two times a day (BID) | ORAL | 0 refills | Status: DC

## 2021-10-27 NOTE — Telephone Encounter (Signed)
Called and spoke to patient. Notified her that [facility] [place] and she voiced understanding. Advised her to call us back if she needed anything further or had further questions  on how to take the medication. Nothing further needed.

## 2021-10-27 NOTE — Telephone Encounter (Signed)
Primary Pulmonologist: Dr. Vassie Loll  Last office visit and with whom: Dr. Vassie Loll 07-05-2021 What do we see them for (pulmonary problems): COPD/ emphysema  Last OV assessment/plan: see below   Was appointment offered to patient (explain)?  Yes patient is unable to travel from [place] to [place] and unable to leave home to come to office. States she has an appt in March in [facility] office    Reason for call: For 2 weeks patient has congestion and sinus drainage. Fatigue. Nose bleeds. No fever. No covid or flu tests. Not having to increase oxygen, still using 2LO2. Taking zyrtec OTC.  Would like to know if she can have some medication sent in to hold her over until appt.   Dr. Vassie Loll please advise    Allergies  Allergen Reactions   Metoprolol Shortness Of Breath and Other (See Comments)    Tiredness   Erythromycin Nausea Only    Immunization History  Administered Date(s) Administered   Fluad Quad(high Dose 65+) 06/15/2019   Influenza, High Dose Seasonal PF 08/22/2015   Influenza,inj,Quad PF,6+ Mos 06/11/2017   Influenza-Unspecified 07/05/2020, 07/04/2021   Moderna Sars-Covid-2 Vaccination 11/30/2019, 12/30/2019, 10/26/2020     Assessment & Plan:           Assessment & Plan Note by Oretha Milch, MD at 07/05/2021 1:16 PM  Author: Oretha Milch, MD Author Type: Physician Filed: 07/05/2021  1:17 PM  Note Status: Carlisle Cater: Cosign Not Required Encounter Date: 07/05/2021  Problem: COPD (chronic obstructive pulmonary disease) (HCC)  Editor: Oretha Milch, MD (Physician)      Prior Versions: 1. Oretha Milch, MD (Physician) at 07/05/2021  1:16 PM - Written  Refills will be provided on Trelegy. Albuterol MDI to use on a as needed basis Advised her to use OTC Zyrtec or Claritin for nasal drainage        Assessment & Plan Note by Oretha Milch, MD at 07/05/2021 1:16 PM  Author: Oretha Milch, MD Author Type: Physician Filed: 07/05/2021  1:16 PM  Note Status: Written Cosign: Cosign  Not Required Encounter Date: 07/05/2021  Problem: Chronic respiratory failure (HCC)  Editor: Oretha Milch, MD (Physician)             Continue 2 L oxygen at all times        Patient Instructions by Oretha Milch, MD at 07/05/2021 12:00 PM  Author: Oretha Milch, MD Author Type: Physician Filed: 07/05/2021 12:34 PM  Note Status: Addendum Sebastian Ache: Cosign Not Required Encounter Date: 07/05/2021  Editor: Oretha Milch, MD (Physician)      Prior Versions: 1. Oretha Milch, MD (Physician) at 07/05/2021 12:34 PM - Addendum   2. Oretha Milch, MD (Physician) at 07/05/2021 12:32 PM - Addendum   3. Oretha Milch, MD (Physician) at 07/05/2021 12:31 PM - Signed    Refills on Trelegy Zyrtec or claritin OTC for nasal drainage

## 2021-10-27 NOTE — Telephone Encounter (Signed)
Pt states she is a COPD pt. States she has been checking her stats and for two weeks she is beginning to feel like she has a cold. Coughing up mucous. Denies fever. Difficulty sleeping from congestion. [facility] [place]. Pt does not want to come in due to living in [place].

## 2021-11-08 ENCOUNTER — Ambulatory Visit (INDEPENDENT_AMBULATORY_CARE_PROVIDER_SITE_OTHER): Payer: Medicare Other

## 2021-11-08 DIAGNOSIS — I4819 Other persistent atrial fibrillation: Secondary | ICD-10-CM | POA: Diagnosis not present

## 2021-11-08 LAB — CUP PACEART REMOTE DEVICE CHECK
Battery Remaining Longevity: 110 mo
Battery Voltage: 3 V
Brady Statistic AP VP Percent: 0 %
Brady Statistic AP VS Percent: 0 %
Brady Statistic AS VP Percent: 15.89 %
Brady Statistic AS VS Percent: 84.11 %
Brady Statistic RA Percent Paced: 0 %
Brady Statistic RV Percent Paced: 15.89 %
Date Time Interrogation Session: 20230221200150
Implantable Lead Implant Date: 20190418
Implantable Lead Implant Date: 20190418
Implantable Lead Location: 753859
Implantable Lead Location: 753860
Implantable Lead Model: 3830
Implantable Lead Model: 5076
Implantable Pulse Generator Implant Date: 20190418
Lead Channel Impedance Value: 342 Ohm
Lead Channel Impedance Value: 380 Ohm
Lead Channel Impedance Value: 456 Ohm
Lead Channel Impedance Value: 513 Ohm
Lead Channel Pacing Threshold Amplitude: 0.625 V
Lead Channel Pacing Threshold Amplitude: 1.75 V
Lead Channel Pacing Threshold Pulse Width: 0.4 ms
Lead Channel Pacing Threshold Pulse Width: 0.4 ms
Lead Channel Sensing Intrinsic Amplitude: 1.625 mV
Lead Channel Sensing Intrinsic Amplitude: 2.625 mV
Lead Channel Sensing Intrinsic Amplitude: 3.375 mV
Lead Channel Sensing Intrinsic Amplitude: 3.375 mV
Lead Channel Setting Pacing Amplitude: 2.5 V
Lead Channel Setting Pacing Pulse Width: 1 ms
Lead Channel Setting Sensing Sensitivity: 1.2 mV

## 2021-11-09 ENCOUNTER — Other Ambulatory Visit: Payer: Self-pay | Admitting: Pulmonary Disease

## 2021-11-15 NOTE — Progress Notes (Signed)
Remote pacemaker transmission.   

## 2021-11-23 ENCOUNTER — Ambulatory Visit (HOSPITAL_COMMUNITY)
Admission: RE | Admit: 2021-11-23 | Discharge: 2021-11-23 | Disposition: A | Discharge status: Home / Self Care | Payer: Medicare Other | Source: Ambulatory Visit | Attending: Pulmonary Disease | Admitting: Pulmonary Disease

## 2021-11-23 ENCOUNTER — Encounter: Payer: Self-pay | Admitting: Pulmonary Disease

## 2021-11-23 ENCOUNTER — Ambulatory Visit: Payer: Medicare Other | Admitting: Pulmonary Disease

## 2021-11-23 ENCOUNTER — Other Ambulatory Visit: Payer: Self-pay

## 2021-11-23 VITALS — BP 134/88 | HR 88 | Temp 98.0°F | Ht 66.0 in | Wt 189.6 lb

## 2021-11-23 DIAGNOSIS — J9611 Chronic respiratory failure with hypoxia: Secondary | ICD-10-CM | POA: Diagnosis not present

## 2021-11-23 DIAGNOSIS — J432 Centrilobular emphysema: Secondary | ICD-10-CM | POA: Insufficient documentation

## 2021-11-23 DIAGNOSIS — N3000 Acute cystitis without hematuria: Secondary | ICD-10-CM

## 2021-11-23 NOTE — Assessment & Plan Note (Signed)
Continue oxygen 24/7 ?She would benefit from pulmonary rehab but she lives in [place] and cannot access the program here at [facility] ?

## 2021-11-23 NOTE — Patient Instructions (Addendum)
?  X Urinalysis ? ?X obtain CXR  ?

## 2021-11-23 NOTE — Assessment & Plan Note (Signed)
Refills on Trelegy. ? ?Finding of left basal crackles -proceed with ?Chest x-ray today -last x-ray from 2019 was reviewed , no infiltrates or evidence of ILD ?

## 2021-11-23 NOTE — Progress Notes (Signed)
? ?  Subjective:  ? ? Patient ID: Tracey Dudley, female    DOB: August 19, 1946, 76 y.o.   MRN: [id number] ? ?HPI ? ?76 yo for FU of severe COPD and chronic hypoxic respiratory failure ?  ?Quit smoking 2015 ?-completed PR ? ?PMH - history of incomplete AV node ablation  s/p PPM ?On cardizem CD, nebivolol & xarelto ?-severe anxiety ? ?Chief Complaint  ?Patient presents with  ? Follow-up  ?  Feels breathing is the same as last ov  ? ?Feb - phone call -For 2 weeks patient has congestion and sinus drainage. Fatigue. >> doxy x 1 week , improved ? ?Breathing is back to baseline, compliant with Trelegy ?She complains of urinary burning and urgency and wonders if she has a UTI ? ?Significant tests/ events reviewed ? ?PFTs 01/2017 >> severe airway obstruction with ratio 37, FEV1 0.59/23%, FVC 1.61/49%, no bronchodilator response, TLC 134% and DLCO 36% ?  ?Echo 09/2015 normal LV function, grade 2 diastolic dysfunction ? ? ?Review of Systems ?neg for any significant sore throat, dysphagia, itching, sneezing, nasal congestion or excess/ purulent secretions, fever, chills, sweats, unintended wt loss, pleuritic or exertional cp, hempoptysis, orthopnea pnd or change in chronic leg swelling. Also denies presyncope, palpitations, heartburn, abdominal pain, nausea, vomiting, diarrhea or change in bowel or urinary habits, dysuria,hematuria, rash, arthralgias, visual complaints, headache, numbness weakness or ataxia. ? ?   ?Objective:  ? Physical Exam ? ?Gen. Pleasant, obese, in no distress ?ENT - no lesions, no post nasal drip, on [place] ?Neck: No JVD, no thyromegaly, no carotid bruits ?Lungs: no use of accessory muscles, no dullness to percussion, Lt basal dry  rales no rhonchi  ?Cardiovascular: Rhythm regular, heart sounds  normal, no murmurs or gallops, no peripheral edema ?Musculoskeletal: No deformities, no cyanosis or clubbing , no tremors ? ? ? ?   ?Assessment & Plan:  ? ?Possible UTI -check urinalysis and treat if positive ? ?

## 2021-11-23 NOTE — Addendum Note (Signed)
Addended by: Carleene Mains D on: 11/23/2021 12:13 PM ? ? Modules accepted: Orders ? ?

## 2021-11-24 LAB — URINALYSIS, ROUTINE W REFLEX MICROSCOPIC
Bilirubin, UA: NEGATIVE
Glucose, UA: NEGATIVE
Ketones, UA: NEGATIVE
Nitrite, UA: NEGATIVE
Specific Gravity, UA: 1.019 (ref 1.005–1.030)
Urobilinogen, Ur: 0.2 mg/dL (ref 0.2–1.0)
pH, UA: 6.5 (ref 5.0–7.5)

## 2021-11-24 LAB — MICROSCOPIC EXAMINATION
Bacteria, UA: NONE SEEN
Casts: NONE SEEN /lpf
RBC, Urine: >30 /hpf — AB (ref 0–2)
WBC, UA: >30 /hpf — AB (ref 0–5)

## 2021-11-24 MED ORDER — SULFAMETHOXAZOLE-TRIMETHOPRIM 800-160 MG PO TABS: 1.0000 | ORAL_TABLET | Freq: Two times a day (BID) | ORAL | Status: DC

## 2021-11-24 MED ORDER — SULFAMETHOXAZOLE-TRIMETHOPRIM 800-160 MG PO TABS: 1.0000 | ORAL_TABLET | Freq: Two times a day (BID) | ORAL | 0 refills | Status: AC

## 2021-11-24 NOTE — Addendum Note (Signed)
Addended by: Carleene Mains D on: 11/24/2021 10:28 AM ? ? Modules accepted: Orders ? ?

## 2021-11-24 NOTE — Addendum Note (Signed)
Addended by: Carleene Mains D on: 11/24/2021 11:45 AM ? ? Modules accepted: Orders ? ?

## 2021-12-08 ENCOUNTER — Other Ambulatory Visit: Payer: Self-pay | Admitting: Cardiology

## 2021-12-08 NOTE — Telephone Encounter (Signed)
Prescription refill request for Xarelto received.  ?Indication: Afib  ?Last office visit: 05/17/21 Diona Browner)  ?Weight: 86kg ?Age: 76 ?Scr: 1.65 (05/17/21)  ?CrCl: 41.71ml/min ? ?Appropriate dose and refill sent to requested pharmacy.  ?

## 2021-12-15 ENCOUNTER — Telehealth: Payer: Self-pay | Admitting: Pulmonary Disease

## 2021-12-15 MED ORDER — SULFAMETHOXAZOLE-TRIMETHOPRIM 800-160 MG PO TABS: 1.0000 | ORAL_TABLET | Freq: Two times a day (BID) | ORAL | 0 refills | Status: AC

## 2021-12-15 NOTE — Telephone Encounter (Signed)
Order sent in for Bactrim.  ?Called and notified patient.  ?Nothing further needed.  ?

## 2021-12-15 NOTE — Telephone Encounter (Signed)
Pt saw Dr. Elsworth Soho recently and was tested for bladder infection.   ?Dr. Elsworth Soho called in antibiotic for 5 days.  Patient finished antibiotic on 11/28/2021 ?She states infection is not as bad but not completely gone.   ?Pt requesting another prescription for antibiotic.  ?Suggested that patient follow up with her PCP since Dr. Elsworth Soho is unavailable today and off Monday and Friday and patient states she only wants to talk to [facility] about issue and does not want a sick message sent to any other doctors with our office or to see PCP regarding issue. Patient aware she may not get a call back until Wednesday and confirms this is okay with her.   ?[place], [place].  ? ?Dr. Elsworth Soho please advise.  ?If replying after 12/15/2021 please send to [facility] triage box. Thanks!  ?

## 2022-01-04 ENCOUNTER — Encounter: Payer: Self-pay | Admitting: Internal Medicine

## 2022-01-04 ENCOUNTER — Ambulatory Visit (INDEPENDENT_AMBULATORY_CARE_PROVIDER_SITE_OTHER): Payer: Medicare Other | Admitting: Internal Medicine

## 2022-01-04 VITALS — BP 138/70 | HR 85 | Ht 66.0 in | Wt 188.0 lb

## 2022-01-04 DIAGNOSIS — I4819 Other persistent atrial fibrillation: Secondary | ICD-10-CM | POA: Diagnosis not present

## 2022-01-04 DIAGNOSIS — Z950 Presence of cardiac pacemaker: Secondary | ICD-10-CM

## 2022-01-04 NOTE — Progress Notes (Signed)
? ? ? ? ?HPI ?[name] returns today for followup. She is a pleasant 76 yo woman with a h/o uncontrolled atrial fib, s/p PPM and AV node ablation. She has some residual conduction and paces about 1/3 of the time. She admits to being sedentary but she feels well. She has been less active during covid. She denies chest pain or sob.  ?Allergies  ?Allergen Reactions  ? Metoprolol Shortness Of Breath and Other (See Comments)  ?  Tiredness  ? Erythromycin Nausea Only  ? ? ? ?Current Outpatient Medications  ?Medication Sig Dispense Refill  ? acetaminophen (TYLENOL) 500 MG tablet Take 500 mg by mouth every 6 (six) hours as needed for moderate pain or headache.     ? albuterol (VENTOLIN HFA) 108 (90 Base) MCG/ACT inhaler Inhale 1 puff into the lungs every 6 (six) hours as needed for wheezing or shortness of breath. 18 g 3  ? ALPRAZolam (XANAX) 0.5 MG tablet Take 0.5 mg by mouth 3 (three) times daily as needed for anxiety.     ? atorvastatin (LIPITOR) 20 MG tablet Take 20 mg by mouth every evening.     ? diltiazem (CARDIZEM CD) 120 MG 24 hr capsule TAKE 1 CAPSULE BY MOUTH TWICE A DAY 180 capsule 3  ? EDARBI 80 MG TABS TAKE 1 TABLET BY MOUTH EVERY DAY 60 tablet 6  ? halobetasol (ULTRAVATE) 0.05 % ointment APPLY TO AFFECTED AREA EVERY DAY  6  ? Nebivolol HCl 20 MG TABS TAKE 1 TABLET BY MOUTH EVERY DAY 90 tablet 3  ? OXYGEN Inhale 2 L into the lungs continuous.    ? PARoxetine (PAXIL) 20 MG tablet Take 20 mg by mouth daily.     ? Rivaroxaban (XARELTO) 15 MG TABS tablet TAKE 1 TABLET (15 MG TOTAL) BY MOUTH DAILY. 30 tablet 5  ? TRELEGY ELLIPTA 100-62.5-25 MCG/ACT AEPB TAKE 1 PUFF BY MOUTH EVERY DAY 60 each 5  ? triamcinolone cream (KENALOG) 0.1 % Apply 1 application topically 2 (two) times daily.     ? ?No current facility-administered medications for this visit.  ? ? ? ?Past Medical History:  ?Diagnosis Date  ? Anxiety   ? Atrial fibrillation (HCC)   ? COPD (chronic obstructive pulmonary disease) (HCC)   ? Essential  hypertension   ? Herpes zoster   ? Hyperlipidemia   ? 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 [employer]onic with history of incomplete AV node ablation  ? Vitamin D deficiency   ? ? ?ROS: ? ? All systems reviewed and negative except as noted in the HPI. ? ? ?Past Surgical History:  ?Procedure Laterality Date  ? ABDOMINAL HYSTERECTOMY    ? ABLATION OF DYSRHYTHMIC FOCUS  01/02/2018  ? AV NODE ABLATION N/A 01/02/2018  ? Procedure: AV NODE ABLATION;  Surgeon: Marinus Maw, MD;  Location: [facility] INVASIVE CV LAB;  Service: Cardiovascular;  Laterality: N/A;  ? CATARACT EXTRACTION    ? COLONOSCOPY WITH PROPOFOL N/A 07/01/2020  ? Procedure: COLONOSCOPY WITH PROPOFOL;  Surgeon: Dolores Frame, MD;  Location: [facility] ENDO SUITE;  Service: Gastroenterology;  Laterality: N/A;  815  ? INSERT / REPLACE / REMOVE PACEMAKER  01/02/2018  ? DUAL CHAMBER  ? PACEMAKER IMPLANT N/A 01/02/2018  ? Procedure: PACEMAKER IMPLANT - Dual Chamber;  Surgeon: Marinus Maw, MD;  Location: [facility] INVASIVE CV LAB;  Service: Cardiovascular;  Laterality: N/A;  ? ? ? ?Family History  ?Problem Relation Age of Onset  ? Breast cancer Sister   ?  CAD Father   ? Asthma Mother   ? ? ? ?Social History  ? ?Socioeconomic History  ? Marital status: Married  ?  Spouse name: Not on file  ? Number of children: Not on file  ? Years of education: Not on file  ? Highest education level: Not on file  ?Occupational History  ? Not on file  ?Tobacco Use  ? Smoking status: Former  ?  Packs/day: 0.50  ?  Years: 30.00  ?  Pack years: 15.00  ?  Types: Cigarettes  ?  Start date: 11/03/1977  ?  Quit date: 06/01/2014  ?  Years since quitting: 7.6  ? Smokeless tobacco: Never  ?Vaping Use  ? Vaping Use: Never used  ?Substance and Sexual Activity  ? Alcohol use: No  ?  Alcohol/week: 0.0 standard drinks  ? Drug use: No  ? Sexual activity: Yes  ?Other Topics Concern  ? Not on file  ?Social History Narrative  ? Not on file  ? ?Social Determinants of Health  ? ?Financial Resource  Strain: Not on file  ?Food Insecurity: Not on file  ?Transportation Needs: Not on file  ?Physical Activity: Not on file  ?Stress: Not on file  ?Social Connections: Not on file  ?Intimate Partner Violence: Not on file  ? ? ? ?BP 138/70   Pulse 85   Ht 5\' 6"  (1.676 m)   Wt 188 lb (85.3 kg)   BMI 30.34 kg/m?  ? ?Physical Exam: ? ?Chronically ill appearing 76 yo woman, wearing oxygen in N/C, NAD ?HEENT: Unremarkable ?Neck:  No JVD, no thyromegally ?Lymphatics:  No adenopathy ?Back:  No CVA tenderness ?Lungs:  Clear with no wheezes ?HEART:  Regular rate rhythm, no murmurs, no rubs, no clicks ?Abd:  soft, positive bowel sounds, no organomegally, no rebound, no guarding ?Ext:  2 plus pulses, no edema, no cyanosis, no clubbing ?Skin:  No rashes no nodules ?Neuro:  CN II through XII intact, motor grossly intact ? ? ?DEVICE  ?Normal device function.  See PaceArt for details.  ? ?Assess/Plan:  ?1. Atrial fib - her VR is fairly well controlled. She will continue her current meds. ?2. HTN - her bp is controlled. Continue current meds. ?3. Obesity - I encouraged the patient to lose weight.  ?4. COPD - she is wearing oxygen. She has no wheezes on exam and appears to be at her baseline. ?  ?66 Zadie Deemer,MD ?

## 2022-01-04 NOTE — Patient Instructions (Signed)
Follow-Up: ?Follow up with Dr. Taylor in 1 year.  ? ?Any Other Special Instructions Will Be Listed Below (If Applicable). ? ? ? ? ?If you need a refill on your cardiac medications before your next appointment, please call your pharmacy. ? ?

## 2022-02-07 ENCOUNTER — Ambulatory Visit (INDEPENDENT_AMBULATORY_CARE_PROVIDER_SITE_OTHER): Payer: Medicare Other

## 2022-02-07 DIAGNOSIS — I4819 Other persistent atrial fibrillation: Secondary | ICD-10-CM | POA: Diagnosis not present

## 2022-02-09 LAB — CUP PACEART REMOTE DEVICE CHECK
Battery Remaining Longevity: 107 mo
Battery Voltage: 3 V
Brady Statistic AP VP Percent: 0 %
Brady Statistic AP VS Percent: 0 %
Brady Statistic AS VP Percent: 21.1 %
Brady Statistic AS VS Percent: 78.9 %
Brady Statistic RA Percent Paced: 0 %
Brady Statistic RV Percent Paced: 21.1 %
Date Time Interrogation Session: 20230523212427
Implantable Lead Implant Date: 20190418
Implantable Lead Implant Date: 20190418
Implantable Lead Location: 753859
Implantable Lead Location: 753860
Implantable Lead Model: 3830
Implantable Lead Model: 5076
Implantable Pulse Generator Implant Date: 20190418
Lead Channel Impedance Value: 323 Ohm
Lead Channel Impedance Value: 342 Ohm
Lead Channel Impedance Value: 437 Ohm
Lead Channel Impedance Value: 494 Ohm
Lead Channel Pacing Threshold Amplitude: 0.625 V
Lead Channel Pacing Threshold Amplitude: 1.75 V
Lead Channel Pacing Threshold Pulse Width: 0.4 ms
Lead Channel Pacing Threshold Pulse Width: 0.4 ms
Lead Channel Sensing Intrinsic Amplitude: 1.625 mV
Lead Channel Sensing Intrinsic Amplitude: 2.625 mV
Lead Channel Sensing Intrinsic Amplitude: 3.5 mV
Lead Channel Sensing Intrinsic Amplitude: 3.5 mV
Lead Channel Setting Pacing Amplitude: 2.5 V
Lead Channel Setting Pacing Pulse Width: 1 ms
Lead Channel Setting Sensing Sensitivity: 1.2 mV

## 2022-02-20 NOTE — Progress Notes (Signed)
Remote pacemaker transmission.   

## 2022-03-16 ENCOUNTER — Ambulatory Visit: Payer: Medicare Other | Admitting: Pulmonary Disease

## 2022-03-16 ENCOUNTER — Encounter: Payer: Self-pay | Admitting: Pulmonary Disease

## 2022-03-16 VITALS — BP 134/74 | HR 74 | Temp 97.7°F | Ht 66.0 in | Wt 182.4 lb

## 2022-03-16 DIAGNOSIS — Z87891 Personal history of nicotine dependence: Secondary | ICD-10-CM

## 2022-03-16 DIAGNOSIS — J432 Centrilobular emphysema: Secondary | ICD-10-CM

## 2022-03-16 DIAGNOSIS — J9611 Chronic respiratory failure with hypoxia: Secondary | ICD-10-CM

## 2022-03-16 NOTE — Assessment & Plan Note (Signed)
Continue oxygen 24/7. Offered her referral to pulmonary rehab but she does not want to pursue this currently

## 2022-03-16 NOTE — Patient Instructions (Addendum)
   X Referral to screening for lung cancer

## 2022-03-16 NOTE — Assessment & Plan Note (Addendum)
Continue Trelegy -this is worked well for her. Use albuterol for rescue.  We discussed COPD action plan and signs and symptoms of COPD flare. Refer her for lung cancer screening program -I discussed risks and benefits.  She feels she has too much going on right now in terms of anxiety but would be willing to schedule CT in 1 to 2 months

## 2022-03-16 NOTE — Progress Notes (Signed)
   Subjective:    Patient ID: Tracey Dudley, female    DOB: 26-Sep-1945, 76 y.o.   MRN: [id number]  HPI  76 yo for FU of severe COPD and chronic hypoxic respiratory failure   Quit smoking 2015 -completed PR   PMH - history of incomplete AV node ablation  s/p PPM On cardizem CD, nebivolol & xarelto -severe anxiety   Chief Complaint  Patient presents with   Follow-up    Patient is doing well. Breathing is about the same     UTI symptoms are resolved. Arrives in a wheelchair today.  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 She is compliant with oxygen.  She completed pulmonary rehab in the remote past and does not want to trial again. Trelegy has worked well for her, rarely needs albuterol   Significant tests/ events reviewed  PFTs 01/2017 >> severe airway obstruction with ratio 37, FEV1 0.59/23%, FVC 1.61/49%, no bronchodilator response, TLC 134% and DLCO 36%   Echo 09/2015 normal LV function, grade 2 diastolic dysfunction    Review of Systems neg for any significant sore throat, dysphagia, itching, sneezing, nasal congestion or excess/ purulent secretions, fever, chills, sweats, unintended wt loss, pleuritic or exertional cp, hempoptysis, orthopnea pnd or change in chronic leg swelling. Also denies presyncope, palpitations, heartburn, abdominal pain, nausea, vomiting, diarrhea or change in bowel or urinary habits, dysuria,hematuria, rash, arthralgias, visual complaints, headache, numbness weakness or ataxia.     Objective:   Physical Exam   Gen. Pleasant, obese, elderly, in no distress ENT - no lesions, no post nasal drip Neck: No JVD, no thyromegaly, no carotid bruits Lungs: no use of accessory muscles, no dullness to percussion, decreased without rales or rhonchi  Cardiovascular: Rhythm regular, heart sounds  normal, no murmurs or gallops, no peripheral edema Musculoskeletal: No deformities, no cyanosis or clubbing , no  tremors        Assessment & Plan:

## 2022-05-02 ENCOUNTER — Other Ambulatory Visit: Payer: Self-pay | Admitting: Pulmonary Disease

## 2022-05-09 ENCOUNTER — Ambulatory Visit (INDEPENDENT_AMBULATORY_CARE_PROVIDER_SITE_OTHER): Payer: Medicare Other

## 2022-05-09 DIAGNOSIS — Z950 Presence of cardiac pacemaker: Secondary | ICD-10-CM

## 2022-05-10 LAB — CUP PACEART REMOTE DEVICE CHECK
Battery Remaining Longevity: 105 mo
Battery Voltage: 2.99 V
Brady Statistic AP VP Percent: 0 %
Brady Statistic AP VS Percent: 0 %
Brady Statistic AS VP Percent: 23.81 %
Brady Statistic AS VS Percent: 76.19 %
Brady Statistic RA Percent Paced: 0 %
Brady Statistic RV Percent Paced: 23.81 %
Date Time Interrogation Session: 20230823073117
Implantable Lead Implant Date: 20190418
Implantable Lead Implant Date: 20190418
Implantable Lead Location: 753859
Implantable Lead Location: 753860
Implantable Lead Model: 3830
Implantable Lead Model: 5076
Implantable Pulse Generator Implant Date: 20190418
Lead Channel Impedance Value: 323 Ohm
Lead Channel Impedance Value: 342 Ohm
Lead Channel Impedance Value: 437 Ohm
Lead Channel Impedance Value: 494 Ohm
Lead Channel Pacing Threshold Amplitude: 0.625 V
Lead Channel Pacing Threshold Amplitude: 1.75 V
Lead Channel Pacing Threshold Pulse Width: 0.4 ms
Lead Channel Pacing Threshold Pulse Width: 0.4 ms
Lead Channel Sensing Intrinsic Amplitude: 1.625 mV
Lead Channel Sensing Intrinsic Amplitude: 2.625 mV
Lead Channel Sensing Intrinsic Amplitude: 3.375 mV
Lead Channel Sensing Intrinsic Amplitude: 3.375 mV
Lead Channel Setting Pacing Amplitude: 2.5 V
Lead Channel Setting Pacing Pulse Width: 1 ms
Lead Channel Setting Sensing Sensitivity: 1.2 mV

## 2022-06-04 ENCOUNTER — Other Ambulatory Visit: Payer: Self-pay | Admitting: Cardiology

## 2022-06-04 ENCOUNTER — Other Ambulatory Visit: Payer: Self-pay | Admitting: Internal Medicine

## 2022-06-04 NOTE — Telephone Encounter (Signed)
Prescription refill request for Xarelto received.  Indication: AF Last office visit: 01/04/22  Beckie Salts MD Weight: 85.3kg Age: 76 Scr: 1.65 on 05/17/21 CrCl:  40.28  Based on above findings Xarelto 15mg  daily is the appropriate dose.  Refill approved.  Requested CBC/BMP be doing at upcoming appt with Dr Domenic Polite.

## 2022-06-06 NOTE — Progress Notes (Signed)
Remote pacemaker transmission.   

## 2022-07-06 ENCOUNTER — Ambulatory Visit: Payer: Medicare Other | Admitting: Pulmonary Disease

## 2022-07-06 ENCOUNTER — Encounter: Payer: Self-pay | Admitting: Pulmonary Disease

## 2022-07-06 VITALS — BP 130/80 | HR 78 | Temp 97.3°F | Ht 66.0 in | Wt 179.4 lb

## 2022-07-06 DIAGNOSIS — Z23 Encounter for immunization: Secondary | ICD-10-CM | POA: Diagnosis not present

## 2022-07-06 DIAGNOSIS — J9611 Chronic respiratory failure with hypoxia: Secondary | ICD-10-CM | POA: Diagnosis not present

## 2022-07-06 DIAGNOSIS — J432 Centrilobular emphysema: Secondary | ICD-10-CM

## 2022-07-06 NOTE — Patient Instructions (Addendum)
  X flu shot 

## 2022-07-06 NOTE — Assessment & Plan Note (Signed)
Continue on Trelegy. Albuterol for rescue. We discussed COPD action plan and signs and symptoms of COPD exacerbation

## 2022-07-06 NOTE — Progress Notes (Signed)
   Subjective:    Patient ID: Tracey Dudley, female    DOB: 1946-04-06, 76 y.o.   MRN: [id number]  HPI  76 yo for FU of severe COPD and chronic hypoxic respiratory failure   Quit smoking 2015 -completed PR   PMH - AV node ablation  s/p PPM On cardizem CD, nebivolol & xarelto -severe anxiety  Chief Complaint  Patient presents with   Follow-up    She is doing well and has no complaints with her breathing.    62-month follow-up visit. She is compliant with oxygen.  She is limited by her hips and walking but she is able to walk around the store with her oxygen.  Husband feels that she is too sedentary.  Saturation 91% on 2 L POC when she walked in today. Compliant with Trelegy.    Significant tests/ events reviewed  PFTs 01/2017 >> severe airway obstruction with ratio 37, FEV1 0.59/23%, FVC 1.61/49%, no bronchodilator response, TLC 134% and DLCO 36%   Echo 09/2015 normal LV function, grade 2 diastolic dysfunction  Review of Systems neg for any significant sore throat, dysphagia, itching, sneezing, nasal congestion or excess/ purulent secretions, fever, chills, sweats, unintended wt loss, pleuritic or exertional cp, hempoptysis, orthopnea pnd or change in chronic leg swelling. Also denies presyncope, palpitations, heartburn, abdominal pain, nausea, vomiting, diarrhea or change in bowel or urinary habits, dysuria,hematuria, rash, arthralgias, visual complaints, headache, numbness weakness or ataxia.     Objective:   Physical Exam  Gen. Pleasant, obese, in no distress ENT - no lesions, no post nasal drip, on POC nasal cannula Neck: No JVD, no thyromegaly, no carotid bruits Lungs: no use of accessory muscles, no dullness to percussion, decreased without rales or rhonchi  Cardiovascular: Rhythm regular, heart sounds  normal, no murmurs or gallops, no peripheral edema Musculoskeletal: No deformities, no cyanosis or clubbing , no tremors       Assessment & Plan:

## 2022-07-06 NOTE — Assessment & Plan Note (Signed)
Continue 2 L POC. Goal saturation above 90%. Flu shot today. Advised RSV and COVID booster vaccination

## 2022-07-22 ENCOUNTER — Telehealth: Payer: Self-pay | Admitting: Pulmonary Disease

## 2022-07-22 MED ORDER — CEFUROXIME AXETIL 500 MG PO TABS: 500.0000 mg | ORAL_TABLET | Freq: Two times a day (BID) | ORAL | 0 refills | Status: AC

## 2022-07-22 NOTE — Telephone Encounter (Signed)
Followed by Dr. Elsworth Soho.  She has sinus congestion and pressure with drainage.  She was advised by Dr. Elsworth Soho to call immediately if she had any symptoms to get an antibiotic.  She is feeling more short of breath, but not having chest congestion or wheezing.  I have sent script for ceftin 500 mg bid for 7 days.  Will have [place] office follow up with patient on 07/23/22 to make sure she doesn't need an ROV scheduled.

## 2022-07-23 NOTE — Telephone Encounter (Signed)
Dr. Elsworth Soho, please advise, would you like [place] to add patient on/ double book for sooner appt in [facility] office or keep recall for Feb? Thanks!

## 2022-07-23 NOTE — Telephone Encounter (Signed)
Called and notified patient of recommendations from Dr. Elsworth Soho. She voiced understanding and is aware if she needs an appointment it will be in [place] office.

## 2022-08-08 ENCOUNTER — Ambulatory Visit (INDEPENDENT_AMBULATORY_CARE_PROVIDER_SITE_OTHER): Payer: Medicare Other

## 2022-08-08 DIAGNOSIS — I4819 Other persistent atrial fibrillation: Secondary | ICD-10-CM

## 2022-08-08 DIAGNOSIS — Z950 Presence of cardiac pacemaker: Secondary | ICD-10-CM

## 2022-08-08 LAB — CUP PACEART REMOTE DEVICE CHECK
Battery Remaining Longevity: 102 mo
Battery Voltage: 2.99 V
Brady Statistic AP VP Percent: 0 %
Brady Statistic AP VS Percent: 0 %
Brady Statistic AS VP Percent: 24.3 %
Brady Statistic AS VS Percent: 75.7 %
Brady Statistic RA Percent Paced: 0 %
Brady Statistic RV Percent Paced: 24.3 %
Date Time Interrogation Session: 20231121200120
Implantable Lead Connection Status: 753985
Implantable Lead Connection Status: 753985
Implantable Lead Implant Date: 20190418
Implantable Lead Implant Date: 20190418
Implantable Lead Location: 753859
Implantable Lead Location: 753860
Implantable Lead Model: 3830
Implantable Lead Model: 5076
Implantable Pulse Generator Implant Date: 20190418
Lead Channel Impedance Value: 323 Ohm
Lead Channel Impedance Value: 323 Ohm
Lead Channel Impedance Value: 418 Ohm
Lead Channel Impedance Value: 494 Ohm
Lead Channel Pacing Threshold Amplitude: 0.625 V
Lead Channel Pacing Threshold Amplitude: 1.75 V
Lead Channel Pacing Threshold Pulse Width: 0.4 ms
Lead Channel Pacing Threshold Pulse Width: 0.4 ms
Lead Channel Sensing Intrinsic Amplitude: 1.625 mV
Lead Channel Sensing Intrinsic Amplitude: 2.625 mV
Lead Channel Sensing Intrinsic Amplitude: 3.625 mV
Lead Channel Sensing Intrinsic Amplitude: 3.625 mV
Lead Channel Setting Pacing Amplitude: 2.5 V
Lead Channel Setting Pacing Pulse Width: 1 ms
Lead Channel Setting Sensing Sensitivity: 1.2 mV
Zone Setting Status: 755011

## 2022-08-14 NOTE — Progress Notes (Unsigned)
Cardiology Office Note  Date: 08/15/2022   ID: Tracey Dudley, DOB 1946-08-11, MRN [id number]  PCP:  Jonathon Bellows, DO  Cardiologist:  Nona Dell, MD Electrophysiologist:  Lewayne Bunting, MD   Chief Complaint  Patient presents with   Cardiac follow-up    History of Present Illness: Tracey Dudley is a 77 y.o. female last seen in August 2022.  She is here for a follow-up visit.  Reports no palpitations or chest pain.  Continues to follow regularly with Pulmonary for treatment of COPD with chronic hypoxic respiratory failure.  No major change in status.  Medtronic pacemaker in place with His bundle lead and history of incomplete AV node ablation.  She continues to follow with Dr. Ladona Ridgel.  Most recent device interrogation indicated normal function.  I reviewed her medications which are outlined below.  She does not report any spontaneous bleeding problems on Xarelto.  She is due for follow-up CBC and BMET, states that she is seeing her PCP in late December.  I personally reviewed her ECG today which shows atrial fibrillation with heart rate in the 90s, IVCD.  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   Medtronic with history of incomplete AV node ablation   Vitamin D deficiency     Past Surgical History:  Procedure Laterality Date   ABDOMINAL HYSTERECTOMY     ABLATION OF DYSRHYTHMIC FOCUS  01/02/2018   AV NODE ABLATION N/A 01/02/2018   Procedure: AV NODE ABLATION;  Surgeon: Marinus Maw, MD;  Location: [facility];  Service: Cardiovascular;  Laterality: N/A;   CATARACT EXTRACTION     COLONOSCOPY WITH PROPOFOL N/A 07/01/2020   Procedure: COLONOSCOPY WITH PROPOFOL;  Surgeon: Dolores Frame, MD;  Location: [facility];  Service: Gastroenterology;   Laterality: N/A;  815   INSERT / REPLACE / REMOVE PACEMAKER  01/02/2018   DUAL CHAMBER   PACEMAKER IMPLANT N/A 01/02/2018   Procedure: PACEMAKER IMPLANT - Dual Chamber;  Surgeon: Marinus Maw, MD;  Location: [facility];  Service: Cardiovascular;  Laterality: N/A;    Current Outpatient Medications  Medication Sig Dispense Refill   acetaminophen (TYLENOL) 500 MG tablet Take 500 mg by mouth every 6 (six) hours as needed for moderate pain or headache.      albuterol (VENTOLIN HFA) 108 (90 Base) MCG/ACT inhaler Inhale 1 puff into the lungs every 6 (six) hours as needed for wheezing or shortness of breath. 18 g 3   ALPRAZolam (XANAX) 0.5 MG tablet Take 0.5 mg by mouth 3 (three) times daily as needed for anxiety.      atorvastatin (LIPITOR) 40 MG tablet Take 40 mg by mouth daily.     cefUROXime (CEFTIN) 500 MG tablet Take 1 tablet (500 mg total) by mouth 2 (two) times daily with a meal. 14 tablet 0   diltiazem (CARDIZEM CD) 120 MG 24 hr capsule TAKE 1 CAPSULE BY MOUTH TWICE A DAY 180 capsule 1   EDARBI 80 MG TABS TAKE 1 TABLET BY MOUTH EVERY DAY 60 tablet 6   halobetasol (ULTRAVATE) 0.05 % ointment APPLY TO AFFECTED AREA EVERY DAY  6   Nebivolol HCl 20 MG TABS TAKE 1 TABLET BY MOUTH EVERY DAY 90 tablet 3   OXYGEN Inhale  2 L into the lungs continuous.     PARoxetine (PAXIL) 20 MG tablet Take 20 mg by mouth daily.      TRELEGY ELLIPTA 100-62.5-25 MCG/ACT AEPB INHALE 1 PUFF BY MOUTH EVERY DAY 60 each 5   triamcinolone cream (KENALOG) 0.1 % Apply 1 application topically 2 (two) times daily.      XARELTO 15 MG TABS tablet TAKE 1 TABLET (15 MG TOTAL) BY MOUTH DAILY. 30 tablet 5   No current facility-administered medications for this visit.   Allergies:  Metoprolol and Erythromycin   ROS:  Recent sinus congestion.  Physical Exam: VS:  BP 134/88   Pulse 95   Ht 5' 6.5" (1.689 m)   Wt 178 lb 9.6 oz (81 kg)   SpO2 90%   BMI 28.39 kg/m , BMI Body mass index is 28.39 kg/m.  Wt Readings  from Last 3 Encounters:  08/15/22 178 lb 9.6 oz (81 kg)  07/06/22 179 lb 6.4 oz (81.4 kg)  03/16/22 182 lb 6.4 oz (82.7 kg)    General: Patient appears comfortable at rest.  In wheelchair today, wearing oxygen via nasal cannula. HEENT: Conjunctiva and lids normal. Neck: Supple, no elevated JVP or carotid bruits. Lungs: Decreased breath sounds without wheezing. Cardiac: Distant irregularly irregular, no S3, 1/6 systolic murmur. Extremities: No pitting edema.  ECG:  An ECG dated 05/17/2021 was personally reviewed today and demonstrated:  Atrial fibrillation with intermittent ventricular paced beats.  Recent Labwork:  August 2022: Hemoglobin 12.6, platelets 181, potassium 5.8, BUN 32, creatinine 1.65  Other Studies Reviewed Today:  No interval cardiac testing for review today.  Assessment and Plan:  1.  Permanent atrial fibrillation with CHA2DS2-VASc score of 3.  She is asymptomatic in terms of palpitations at this time and has reasonable heart rate control status post incomplete AV node ablation with Medtronic pacemaker in place and continuing on Cardizem CD.  She remains on Xarelto 15 mg daily for stroke prophylaxis.  Needs follow-up CBC and BMET, reasonable to obtain through PCP office.  2.  COPD with chronic hypoxic respiratory failure.  Continue to follow with Dr. Vassie Loll.  Medication Adjustments/Labs and Tests Ordered: Current medicines are reviewed at length with the patient today.  Concerns regarding medicines are outlined above.   Tests Ordered: Orders Placed This Encounter  Procedures   EKG 12-Lead    Medication Changes: No orders of the defined types were placed in this encounter.   Disposition:  Follow up  6 months.  Signed, Jonelle Sidle, MD, [facility] 08/15/2022 3:32 PM    [facility] [address] [place], [place] [address] Phone: [phone]; Fax: [phone]

## 2022-08-15 ENCOUNTER — Ambulatory Visit: Payer: Medicare Other | Attending: Cardiology | Admitting: Cardiology

## 2022-08-15 ENCOUNTER — Encounter: Payer: Self-pay | Admitting: Cardiology

## 2022-08-15 VITALS — BP 134/88 | HR 95 | Ht 66.5 in | Wt 178.6 lb

## 2022-08-15 DIAGNOSIS — I4821 Permanent atrial fibrillation: Secondary | ICD-10-CM

## 2022-08-15 DIAGNOSIS — Z950 Presence of cardiac pacemaker: Secondary | ICD-10-CM | POA: Diagnosis not present

## 2022-08-15 NOTE — Patient Instructions (Signed)
Medication Instructions:  Your physician recommends that you continue on your current medications as directed. Please refer to the Current Medication list given to you today.   Labwork: Please ask your primary care doctor to draw CBC and BMET at your visit.  Testing/Procedures: None today  Follow-Up: 6 months  Any Other Special Instructions Will Be Listed Below (If Applicable).  If you need a refill on your cardiac medications before your next appointment, please call your pharmacy.

## 2022-08-29 ENCOUNTER — Telehealth: Payer: Self-pay | Admitting: Pulmonary Disease

## 2022-08-29 NOTE — Telephone Encounter (Signed)
Spoke with the pt  She is c/o nasal congestion and PND past few days  Feels nauseated due to drainage  She states blowing out clear sputum from her nose  She denies any increased cough or dyspnea  No fevers or aches  She is asking for something to be called in due to not being able to take OTC meds with her pacemaker  Pt aware will get a call back tomorrow  Please advise, thanks!  Allergies  Allergen Reactions   Metoprolol Shortness Of Breath and Other (See Comments)    Tiredness   Erythromycin Nausea Only

## 2022-08-30 MED ORDER — ONDANSETRON HCL 4 MG PO TABS: 4.0000 mg | ORAL_TABLET | Freq: Three times a day (TID) | ORAL | 0 refills | Status: AC | PRN

## 2022-08-30 MED ORDER — AZITHROMYCIN 250 MG PO TABS: 250.0000 mg | ORAL_TABLET | Freq: Every day | ORAL | 0 refills | Status: AC

## 2022-08-30 NOTE — Progress Notes (Signed)
Remote pacemaker transmission.   

## 2022-08-30 NOTE — Telephone Encounter (Signed)
Called and spoke to patient and went over medications dr Vassie Loll wanted to send in. She verbalized understanding. Verified pharmacy. Nothing further needed

## 2022-09-04 ENCOUNTER — Telehealth: Payer: Self-pay | Admitting: Cardiology

## 2022-09-04 NOTE — Telephone Encounter (Signed)
New Message:    Patient's sister called. She wanted Dr Diona Browner to know that the patient is in ICU in the hospital in [place].

## 2022-09-24 ENCOUNTER — Telehealth: Payer: Self-pay | Admitting: Pulmonary Disease

## 2022-09-24 NOTE — Telephone Encounter (Signed)
PT's daughter called and she wanted Dr. Elsworth Soho to request all records be sent to him since PT was just in hospital and also recently fell. She is a [employer]. currently  I let daughter know SHE has to request records herself but wanted Dr. Elsworth Soho to know her current situation. Daughter said she may need adjustment to meds and /or O2.

## 2022-09-24 NOTE — Telephone Encounter (Signed)
Noted. Dr. Elsworth Soho will route to you so you are aware pt was recently hospitalized and fell. Will keep an eye out for a med rec request and/or medical records on patient.

## 2022-10-04 ENCOUNTER — Encounter: Payer: Self-pay | Admitting: Cardiology

## 2022-10-04 ENCOUNTER — Telehealth: Payer: Self-pay | Admitting: Cardiology

## 2022-10-04 NOTE — Telephone Encounter (Signed)
I contacted pt  to schedule f/u- pt declined f/u at this time. Pt stated she will call back when she is ready to schedule as she is in a rehab facility.

## 2022-10-04 NOTE — Telephone Encounter (Signed)
Error

## 2022-10-04 NOTE — Telephone Encounter (Signed)
Noted. Will make f/u for patient.

## 2022-10-04 NOTE — Telephone Encounter (Signed)
We received a fax from [name] she was admitted at [facility] from 08/31/2022-09/15/2022 for pneumonia and a severe fall. She asked if Dr. Domenic Polite would look over her records from her stay to see if he would like to make any changes to her current care plan or, have any recommendations. She stated she was taken off her blood thinner because, of the severe fall she had. She is currently at [facility] in [place]. We received 197 pages of records. If someone was to reach out she wants her daughter Dede Query to be contacted at [phone]. Thank you.

## 2022-10-08 ENCOUNTER — Telehealth: Payer: Self-pay | Admitting: Pulmonary Disease

## 2022-10-08 DIAGNOSIS — J9611 Chronic respiratory failure with hypoxia: Secondary | ICD-10-CM

## 2022-10-08 DIAGNOSIS — J432 Centrilobular emphysema: Secondary | ICD-10-CM

## 2022-10-09 ENCOUNTER — Telehealth: Payer: Self-pay | Admitting: Pulmonary Disease

## 2022-10-09 NOTE — Telephone Encounter (Signed)
Dr. Elsworth Soho are you okay with [place] adding on patient to schedule in [facility] office or seeing patient next available (11/20/2022) in [facility]?

## 2022-10-09 NOTE — Telephone Encounter (Signed)
PT daughter calling to set up appt w/Dr. Elsworth Soho for a Hospital FU for her mom. No appts avail. They love Dr. Elsworth Soho and said she was on a respirator for 9 days. Daughter is trying to consolidate her appts in the [place] area because she has to arrange for paid transportation for her many medical visits. Can we work her in?  Also, Did we get her fax requesting to add her Abigail Butts)  to HIPAA and for the [facility] in [place], [place] to send her records to [place]? She is asking [place] to request those records in this same letter letter.  Also, Please request these mefical records (not mentioned in the letter):   [facility] in [place], [place] as well.  Pls call Melvenia Beam @ [phone] to advise.

## 2022-10-10 ENCOUNTER — Telehealth: Payer: Self-pay | Admitting: Pulmonary Disease

## 2022-10-10 NOTE — Telephone Encounter (Signed)
Patients daughter had stated in prev. Phone call that transportation was scheduled for 3:00 appt and didn't think they could get her transportation worked out sooner.   Appt scheduled. Will mail appt reminder as discussed earlier with Wendi.   Have also updated appt desk notes that pt needs [place] to go over allergy list since daughter thinks a lot of the allergies could be incorrect and also needs new DPR so we are able to speak with Wendi.

## 2022-10-10 NOTE — Telephone Encounter (Signed)
Called and spoke with Ellard Artis (there was a HIPAA form in her chart and patient gave verbal consent)   Patients daughter wants to know if patient can be added to [facility] schedule for 2022/11/16 at 4:00. She states they have to pay for transportation $200 a ride and patient will be with Dr. Domenic Polite that day across the street. Dr. Elsworth Soho please advise is this okay with you?

## 2022-10-11 ENCOUNTER — Telehealth: Payer: Self-pay | Admitting: Pulmonary Disease

## 2022-10-11 MED ORDER — ALBUTEROL SULFATE (2.5 MG/3ML) 0.083% IN NEBU: 2.5000 mg | INHALATION_SOLUTION | Freq: Four times a day (QID) | RESPIRATORY_TRACT | 2 refills | Status: AC | PRN

## 2022-10-11 NOTE — Telephone Encounter (Signed)
Please refer to encounter from 1/22.

## 2022-10-11 NOTE — Telephone Encounter (Signed)
I was able to review records from hospitalization in [place].  Discharge summary, echocardiogram report, bronchoscopy report, and ECG pulled for scanning into Epic.  I do see that she was taken off Xarelto in the setting of an iliopsoas bleed after fall.  It looks like she was to have hemoglobin followed at rehab center and decision made about resuming anticoagulation based on that.  Please make sure that an office follow-up has been arranged if not already taken care of.

## 2022-10-11 NOTE — Telephone Encounter (Signed)
Called and notified patients daughter Ellard Artis and she voiced understanding. Nothing further needed at this time,

## 2022-10-11 NOTE — Telephone Encounter (Signed)
PT needs breathing treatment meds and a new nebulizer machine. She has lost hers (machine)  and no one knows where it is. Daughter calling saying mom can feel the need for nebulizer treatment.O2 level yesterday @ rest  was bet 82 and 85 but holding at 92-90 for the past 4 hours.  Mom really noticed a difference when the hospital administered a breathing treatment.    Please call  [name] [phone]

## 2022-10-11 NOTE — Telephone Encounter (Signed)
Called and spoke with pt's daughter [name] who states that pt was needing a humidifier bottle that can connect to her home concentrator so order has been placed for that.  While speaking with [name], she was wanting to know if pt could be prescribed a nebulizer and solution to use with it as when pt was in the hospital and also in rehab, they kept doing neb treatments on pt to help with her breathing.  Dr. Elsworth Soho, please advise on the nebulizer and solution.

## 2022-10-15 ENCOUNTER — Telehealth: Payer: Self-pay | Admitting: Pulmonary Disease

## 2022-10-15 NOTE — Telephone Encounter (Signed)
PT is in hospital. They want to put her on a vent. Daughter wants Dr. Bari Mantis advise. Please call ASAP.  Has a C2 build up. O@ is OK now but she was turning blue and O2 took a dive.

## 2022-10-15 NOTE — Telephone Encounter (Signed)
PT is in hospital. They want to put her on a vent. Daughter wants Dr. Bari Mantis advise. Please call ASAP.       Dr. Elsworth Soho please advise, are you able to call patients daughter? She is requesting to speak with you.

## 2022-10-16 ENCOUNTER — Telehealth: Payer: Self-pay | Admitting: Cardiology

## 2022-10-16 NOTE — Telephone Encounter (Signed)
Patient's daughter would like to inform Dr. Domenic Polite that the patient is currently in the ICU with fluid around her heart.

## 2022-10-16 NOTE — Telephone Encounter (Signed)
Phone note from yesterday by [facility] stated patient was getting intubated for high CO2 levels at [facility]    I will [place]

## 2022-10-18 ENCOUNTER — Other Ambulatory Visit (HOSPITAL_COMMUNITY): Payer: Self-pay

## 2022-10-18 ENCOUNTER — Telehealth: Payer: Self-pay

## 2022-10-18 NOTE — Telephone Encounter (Signed)
Patient Advocate Encounter  Received a fax from [facility] regarding Prior Authorization for Albuterol Sulfate (2.5 MG/3ML)0.083% nebulizer solution.   Authorization has been DENIED due to this medication is covered under medicare part B and not D.

## 2022-10-19 NOTE — Telephone Encounter (Signed)
Called pt's pharmacy to see if pt was able to pick neb sol up and was told that pt did pick Rx up on 1/26. Nothing further needed.

## 2022-10-25 ENCOUNTER — Inpatient Hospital Stay: Payer: Medicare Other | Admitting: Pulmonary Disease

## 2022-10-25 ENCOUNTER — Ambulatory Visit: Payer: Medicare Other | Admitting: Cardiology

## 2022-10-26 ENCOUNTER — Telehealth: Payer: Self-pay | Admitting: Internal Medicine

## 2022-10-26 NOTE — Telephone Encounter (Signed)
Daughter called and stated pt passed last night,  sent HIM a message as well

## 2022-10-26 NOTE — Telephone Encounter (Signed)
Aaron Edelman Coke called in stating that the patient passed away yesterday, requesting that all future appointments be cancelled. He states patient did not have a bedside monitor and just used an app on her phone, but he would like a call back to confirm this. Called device clinic but no answer and unable to leave voice message. Phone rang for 3 minutes straight.

## 2022-11-16 DEATH — deceased
